# Patient Record
Sex: Female | Born: 1960
Health system: Southern US, Community
[De-identification: ages and names within clinical notes are randomized; demographics above are authoritative.]

## PROBLEM LIST (undated history)

## (undated) DIAGNOSIS — R002 Palpitations: Secondary | ICD-10-CM

## (undated) DIAGNOSIS — F419 Anxiety disorder, unspecified: Secondary | ICD-10-CM

## (undated) DIAGNOSIS — R011 Cardiac murmur, unspecified: Secondary | ICD-10-CM

## (undated) DIAGNOSIS — Z9889 Other specified postprocedural states: Secondary | ICD-10-CM

## (undated) HISTORY — DX: Palpitations: R00.2

## (undated) HISTORY — PX: OTHER SURGICAL HISTORY: SHX169

## (undated) HISTORY — DX: Cardiac murmur, unspecified: R01.1

---

## 2003-10-21 ENCOUNTER — Other Ambulatory Visit: Admission: RE | Admit: 2003-10-21 | Discharge: 2003-10-21 | Payer: Self-pay | Admitting: Obstetrics and Gynecology

## 2005-06-12 ENCOUNTER — Other Ambulatory Visit: Admission: RE | Admit: 2005-06-12 | Discharge: 2005-06-12 | Payer: Self-pay | Admitting: Obstetrics and Gynecology

## 2005-09-27 ENCOUNTER — Ambulatory Visit: Payer: Self-pay | Admitting: Obstetrics & Gynecology

## 2006-12-30 ENCOUNTER — Other Ambulatory Visit: Admission: RE | Admit: 2006-12-30 | Discharge: 2006-12-30 | Payer: Self-pay | Admitting: Obstetrics and Gynecology

## 2007-05-22 ENCOUNTER — Ambulatory Visit: Payer: Self-pay | Admitting: Obstetrics & Gynecology

## 2009-03-18 ENCOUNTER — Ambulatory Visit: Payer: Self-pay | Admitting: Gynecology

## 2009-03-18 ENCOUNTER — Other Ambulatory Visit: Admission: RE | Admit: 2009-03-18 | Discharge: 2009-03-18 | Payer: Self-pay | Admitting: Gynecology

## 2010-12-21 ENCOUNTER — Other Ambulatory Visit: Payer: Self-pay | Admitting: Gynecology

## 2010-12-21 NOTE — Telephone Encounter (Signed)
Lm on message on pt vm regarding Rx refill for Ambien, pt is overdue for her annual.

## 2011-04-15 LAB — HM PAP SMEAR: HM Pap smear: NORMAL

## 2011-04-15 LAB — HM MAMMOGRAPHY: HM Mammogram: NORMAL

## 2011-04-30 ENCOUNTER — Ambulatory Visit: Payer: Self-pay | Admitting: Specialist

## 2012-12-12 ENCOUNTER — Telehealth: Payer: Self-pay | Admitting: Internal Medicine

## 2012-12-12 NOTE — Telephone Encounter (Signed)
Left message for pt to call office  See note below  Shelia Media, MD Wyatt Portela            That would be fine.      Previous Messages      ----- Message -----  From: Wyatt Portela  Sent: 12/09/2012 3:30 PM  To: Shelia Media, MD   Ms [name] came in today wanting to know if you would see her daughter Jodi Anderson 03/09/1960   Phone# [phone]

## 2012-12-16 NOTE — Telephone Encounter (Signed)
Left message for pt to call office

## 2012-12-24 NOTE — Telephone Encounter (Signed)
Appointment 02/10/13 pt aware

## 2013-02-10 ENCOUNTER — Ambulatory Visit: Payer: Self-pay | Admitting: Internal Medicine

## 2013-02-17 ENCOUNTER — Ambulatory Visit: Payer: Self-pay | Admitting: Internal Medicine

## 2013-03-25 ENCOUNTER — Encounter: Payer: Self-pay | Admitting: *Deleted

## 2013-04-14 ENCOUNTER — Encounter (INDEPENDENT_AMBULATORY_CARE_PROVIDER_SITE_OTHER): Payer: Self-pay

## 2013-04-14 ENCOUNTER — Encounter: Payer: Self-pay | Admitting: Internal Medicine

## 2013-04-14 ENCOUNTER — Ambulatory Visit (INDEPENDENT_AMBULATORY_CARE_PROVIDER_SITE_OTHER): Payer: 59 | Admitting: Internal Medicine

## 2013-04-14 VITALS — BP 98/70 | HR 64 | Temp 98.1°F | Ht 61.5 in | Wt 122.0 lb

## 2013-04-14 DIAGNOSIS — G4700 Insomnia, unspecified: Secondary | ICD-10-CM

## 2013-04-14 DIAGNOSIS — Z1239 Encounter for other screening for malignant neoplasm of breast: Secondary | ICD-10-CM

## 2013-04-14 DIAGNOSIS — Z1211 Encounter for screening for malignant neoplasm of colon: Secondary | ICD-10-CM | POA: Insufficient documentation

## 2013-04-14 DIAGNOSIS — R002 Palpitations: Secondary | ICD-10-CM

## 2013-04-14 MED ORDER — ZOLPIDEM TARTRATE 10 MG PO TABS: 10.0000 mg | ORAL_TABLET | Freq: Every evening | ORAL | Status: DC | PRN

## 2013-04-14 NOTE — Assessment & Plan Note (Signed)
Continue Ambien prn  

## 2013-04-14 NOTE — Assessment & Plan Note (Signed)
Will request notes on previous evaluation. Exam is normal today. Follow up after records reviewed.

## 2013-04-14 NOTE — Progress Notes (Signed)
Pre visit review using our clinic review tool, if applicable. No additional management support is needed unless otherwise documented below in the visit note. 

## 2013-04-14 NOTE — Progress Notes (Signed)
Subjective:    Patient ID: Jodi Anderson, female    DOB: 1960/04/27, 53 y.o.   MRN: [id number]  HPI 53YO female presents to establish care. She is generally feeling well. Only concern today is occasional palpitations which occur when walking up stairs. She has been evaluated for this before with EKG and labs. Reports evaluation was normal. She exercises regularly by walking and lifting weights without symptoms. Denies chest pain or shortness of breath. Otherwise, doing well.  Symptoms of insomnia well controlled with Ambien, currently taking 1/3 of a 10mg  tablet daily. No issues with daytime somnolence.  Review of Systems  Constitutional: Negative for fever, chills, appetite change, fatigue and unexpected weight change.  HENT: Negative for congestion, ear pain, sinus pressure, sore throat, trouble swallowing and voice change.   Eyes: Negative for visual disturbance.  Respiratory: Negative for cough, shortness of breath, wheezing and stridor.   Cardiovascular: Positive for palpitations. Negative for chest pain and leg swelling.  Gastrointestinal: Negative for nausea, vomiting, abdominal pain, diarrhea, constipation, blood in stool, abdominal distention and anal bleeding.  Genitourinary: Negative for dysuria and flank pain.  Musculoskeletal: Negative for arthralgias, gait problem, myalgias and neck pain.  Skin: Negative for color change and rash.  Neurological: Negative for dizziness and headaches.  Hematological: Negative for adenopathy. Does not bruise/bleed easily.  Psychiatric/Behavioral: Negative for suicidal ideas, sleep disturbance and dysphoric mood. The patient is not nervous/anxious.        Objective:    BP 98/70  Pulse 64  Temp(Src) 98.1 F (36.7 C) (Oral)  Ht 5' 1.5" (1.562 m)  Wt 122 lb (55.339 kg)  BMI 22.68 kg/m2  SpO2 95%  LMP 03/31/2013 Physical Exam  Constitutional: She is oriented to person, place, and time. She appears well-developed and well-nourished. No  distress.  HENT:  Head: Normocephalic and atraumatic.  Right Ear: External ear normal.  Left Ear: External ear normal.  Nose: Nose normal.  Mouth/Throat: Oropharynx is clear and moist. No oropharyngeal exudate.  Eyes: Conjunctivae are normal. Pupils are equal, round, and reactive to light. Right eye exhibits no discharge. Left eye exhibits no discharge. No scleral icterus.  Neck: Normal range of motion. Neck supple. No tracheal deviation present. No thyromegaly present.  Cardiovascular: Normal rate, regular rhythm, normal heart sounds and intact distal pulses.  Exam reveals no gallop and no friction rub.   No murmur heard. Pulmonary/Chest: Effort normal and breath sounds normal. No accessory muscle usage. Not tachypneic. No respiratory distress. She has no decreased breath sounds. She has no wheezes. She has no rhonchi. She has no rales. She exhibits no tenderness.  Abdominal: Soft. Bowel sounds are normal. She exhibits no distension and no mass. There is no tenderness. There is no rebound and no guarding.  Musculoskeletal: Normal range of motion. She exhibits no edema and no tenderness.  Lymphadenopathy:    She has no cervical adenopathy.  Neurological: She is alert and oriented to person, place, and time. No cranial nerve deficit. She exhibits normal muscle tone. Coordination normal.  Skin: Skin is warm and dry. No rash noted. She is not diaphoretic. No erythema. No pallor.  Psychiatric: She has a normal mood and affect. Her behavior is normal. Judgment and thought content normal.          Assessment & Plan:   Problem List Items Addressed This Visit   Insomnia     Continue Ambien prn.    Relevant Medications      zolpidem (AMBIEN) tablet  Palpitations - Primary     Will request notes on previous evaluation. Exam is normal today. Follow up after records reviewed.    Screening for breast cancer     Mammogram ordered.    Relevant Orders      MM Digital Screening   Screening  for colon cancer     Colonoscopy referral placed.    Relevant Orders      Ambulatory referral to Gastroenterology       Return in about 3 months (around 07/15/2013) for Physical.

## 2013-04-14 NOTE — Assessment & Plan Note (Signed)
Mammogram ordered

## 2013-04-14 NOTE — Assessment & Plan Note (Signed)
Colonoscopy referral placed

## 2013-05-04 ENCOUNTER — Telehealth: Payer: Self-pay | Admitting: Internal Medicine

## 2013-05-04 ENCOUNTER — Telehealth: Payer: Self-pay | Admitting: *Deleted

## 2013-05-04 ENCOUNTER — Ambulatory Visit: Payer: Self-pay | Admitting: Internal Medicine

## 2013-05-04 ENCOUNTER — Ambulatory Visit (INDEPENDENT_AMBULATORY_CARE_PROVIDER_SITE_OTHER): Payer: 59 | Admitting: Internal Medicine

## 2013-05-04 ENCOUNTER — Encounter: Payer: Self-pay | Admitting: Internal Medicine

## 2013-05-04 VITALS — BP 108/72 | HR 64 | Temp 98.5°F | Wt 119.0 lb

## 2013-05-04 DIAGNOSIS — N920 Excessive and frequent menstruation with regular cycle: Secondary | ICD-10-CM

## 2013-05-04 DIAGNOSIS — N946 Dysmenorrhea, unspecified: Secondary | ICD-10-CM | POA: Insufficient documentation

## 2013-05-04 LAB — CBC WITH DIFFERENTIAL/PLATELET
Basophils Absolute: 0 10*3/uL (ref 0.0–0.1)
Basophils Relative: 0.8 % (ref 0.0–3.0)
Eosinophils Absolute: 0 10*3/uL (ref 0.0–0.7)
Eosinophils Relative: 0.3 % (ref 0.0–5.0)
HCT: 28 % — ABNORMAL LOW (ref 36.0–46.0)
Hemoglobin: 8.9 g/dL — ABNORMAL LOW (ref 12.0–15.0)
Lymphocytes Relative: 17.3 % (ref 12.0–46.0)
Lymphs Abs: 0.9 10*3/uL (ref 0.7–4.0)
MCHC: 31.8 g/dL (ref 30.0–36.0)
MCV: 72.7 fl — ABNORMAL LOW (ref 78.0–100.0)
Monocytes Absolute: 0.5 10*3/uL (ref 0.1–1.0)
Monocytes Relative: 9.6 % (ref 3.0–12.0)
Neutro Abs: 3.8 10*3/uL (ref 1.4–7.7)
Neutrophils Relative %: 72 % (ref 43.0–77.0)
Platelets: 343 10*3/uL (ref 150.0–400.0)
RBC: 3.86 Mil/uL — ABNORMAL LOW (ref 3.87–5.11)
RDW: 18 % — ABNORMAL HIGH (ref 11.5–14.6)
WBC: 5.3 10*3/uL (ref 4.5–10.5)

## 2013-05-04 LAB — FERRITIN: Ferritin: 3.2 ng/mL — ABNORMAL LOW (ref 10.0–291.0)

## 2013-05-04 NOTE — Telephone Encounter (Signed)
Ok,noted

## 2013-05-04 NOTE — Telephone Encounter (Signed)
Left message to return call 

## 2013-05-04 NOTE — Telephone Encounter (Signed)
You can put her in at 11am for visit.

## 2013-05-04 NOTE — Assessment & Plan Note (Signed)
Recent menorrhagia. Exam remarkable for heavy bleeding from cervical os. Will set up OB/GYN evaluation for US and exam. Question fibroid. Will check CBC, serum HCG and ferritin today.

## 2013-05-04 NOTE — Telephone Encounter (Signed)
Patient returned call and confirmed appointment for today

## 2013-05-04 NOTE — Telephone Encounter (Signed)
Pt called with concerns of passing very large clots vaginally over the weekend.  States she has had BTL but does have 1 period monthly.  States she has never experienced clots like she has passed this weekend and she is very concerned.  States it was very heavy Friday night, continued but lighter on Saturday and Sunday.  Asking if Dr. Dan HumphreysWalker could see her or send her for an ultrasound or refer her to someone who can help with this.  No appt available with Dr. Dan HumphreysWalker in office today.  Also transferred to triage  May leave msg on cell.

## 2013-05-04 NOTE — Telephone Encounter (Signed)
Called pt with results. She will follow up with OB tomorrow.

## 2013-05-04 NOTE — Telephone Encounter (Signed)
Pt called asking for results of US.  States she is aware that Dr. Dan HumphreysWalker is gone for the day, but is hoping someone can give her the results.  Call on cell.  Pt is very anxious.

## 2013-05-04 NOTE — Progress Notes (Signed)
   Subjective:    Patient ID: Jodi Anderson, female    DOB: 10/10/1960, 53 y.o.   MRN: [id number]  HPI 53YO female presents for acute visit.  Menorrhagia - Periods have typically been regular, lasting 5 days, light. Friday, felt pressure in vaginal area, had large blood clot. Friday night, had another large blood clot. Continued small clots on Saturday and Sunday. Checked pregnancy test which was negative. No cramping with clots.  Review of Systems  Constitutional: Positive for fatigue. Negative for fever, chills and diaphoresis.  Respiratory: Negative for shortness of breath.   Cardiovascular: Negative for chest pain.  Gastrointestinal: Negative for abdominal pain.  Genitourinary: Positive for vaginal bleeding and menstrual problem. Negative for hematuria, flank pain, vaginal discharge, genital sores, vaginal pain and pelvic pain.       Objective:    BP 108/72  Pulse 64  Temp(Src) 98.5 F (36.9 C) (Oral)  Wt 119 lb (53.978 kg)  SpO2 99%  LMP 03/31/2013 Physical Exam  Constitutional: She is oriented to person, place, and time. She appears well-developed and well-nourished. No distress.  HENT:  Head: Normocephalic and atraumatic.  Right Ear: External ear normal.  Left Ear: External ear normal.  Nose: Nose normal.  Mouth/Throat: Oropharynx is clear and moist.  Eyes: Conjunctivae are normal. Pupils are equal, round, and reactive to light. Right eye exhibits no discharge. Left eye exhibits no discharge. No scleral icterus.  Neck: Normal range of motion. Neck supple. No tracheal deviation present. No thyromegaly present.  Pulmonary/Chest: Effort normal. No accessory muscle usage. Not tachypneic. She has no decreased breath sounds. She has no rhonchi.  Genitourinary: Cervix exhibits no motion tenderness and no friability. Right adnexum displays no mass, no tenderness and no fullness. Left adnexum displays no mass, no tenderness and no fullness. There is bleeding around the vagina.    Profuse bleeding from cervical os with passage of moderate clots  Musculoskeletal: Normal range of motion. She exhibits no edema and no tenderness.  Lymphadenopathy:    She has no cervical adenopathy.  Neurological: She is alert and oriented to person, place, and time. No cranial nerve deficit. She exhibits normal muscle tone. Coordination normal.  Skin: Skin is warm and dry. No rash noted. She is not diaphoretic. No erythema. No pallor.  Psychiatric: She has a normal mood and affect. Her behavior is normal. Judgment and thought content normal.          Assessment & Plan:   Problem List Items Addressed This Visit   Dysmenorrhea   Relevant Orders      US Transvaginal Non-OB   Menorrhagia - Primary     Recent menorrhagia. Exam remarkable for heavy bleeding from cervical os. Will set up OB/GYN evaluation for US and exam. Question fibroid. Will check CBC, serum HCG and ferritin today.    Relevant Orders      Ambulatory referral to Obstetrics / Gynecology      CBC with Differential      Ferritin      hCG, serum, qualitative       Return in about 1 week (around 05/11/2013) for Follow up.

## 2013-05-04 NOTE — Telephone Encounter (Signed)
Read below and please advise 

## 2013-05-04 NOTE — Progress Notes (Signed)
Pre visit review using our clinic review tool, if applicable. No additional management support is needed unless otherwise documented below in the visit note. 

## 2013-05-04 NOTE — Telephone Encounter (Signed)
Megan from [facility] called with preliminary ultrasound results. Small fibroid noted 1.2 cm in size. Will send final note when completed

## 2013-05-05 LAB — HCG, SERUM, QUALITATIVE: Preg, Serum: NEGATIVE

## 2013-05-06 ENCOUNTER — Telehealth: Payer: Self-pay | Admitting: *Deleted

## 2013-05-06 NOTE — Telephone Encounter (Signed)
Pt left VM, asking about iron Rx, [facility] Per Dr. Tilman NeatWalker's result note: "She will start Ferrous sulfate 324mg  po tid"

## 2013-05-07 NOTE — Telephone Encounter (Signed)
Pt notified to begin ferrous sulfate OTC 324 mg po tid.

## 2013-07-15 ENCOUNTER — Encounter: Payer: 59 | Admitting: Internal Medicine

## 2013-07-15 DIAGNOSIS — Z0289 Encounter for other administrative examinations: Secondary | ICD-10-CM

## 2013-09-24 ENCOUNTER — Encounter: Payer: 59 | Admitting: Internal Medicine

## 2013-10-19 ENCOUNTER — Other Ambulatory Visit: Payer: Self-pay | Admitting: Internal Medicine

## 2013-10-19 NOTE — Telephone Encounter (Signed)
Last visit 05/04/13

## 2013-11-24 ENCOUNTER — Ambulatory Visit: Payer: Self-pay | Admitting: Internal Medicine

## 2013-11-24 LAB — HM MAMMOGRAPHY: HM Mammogram: NEGATIVE

## 2013-11-25 ENCOUNTER — Encounter: Payer: Self-pay | Admitting: *Deleted

## 2013-12-10 ENCOUNTER — Encounter (INDEPENDENT_AMBULATORY_CARE_PROVIDER_SITE_OTHER): Payer: Self-pay

## 2013-12-10 ENCOUNTER — Other Ambulatory Visit (HOSPITAL_COMMUNITY)
Admission: RE | Admit: 2013-12-10 | Discharge: 2013-12-10 | Disposition: A | Discharge status: Home / Self Care | Payer: 59 | Source: Ambulatory Visit | Attending: Internal Medicine | Admitting: Internal Medicine

## 2013-12-10 ENCOUNTER — Ambulatory Visit (INDEPENDENT_AMBULATORY_CARE_PROVIDER_SITE_OTHER): Payer: 59 | Admitting: Internal Medicine

## 2013-12-10 ENCOUNTER — Ambulatory Visit (INDEPENDENT_AMBULATORY_CARE_PROVIDER_SITE_OTHER): Payer: 59 | Admitting: *Deleted

## 2013-12-10 ENCOUNTER — Encounter: Payer: Self-pay | Admitting: Internal Medicine

## 2013-12-10 VITALS — BP 98/60 | HR 70 | Temp 98.5°F | Ht 61.75 in | Wt 123.5 lb

## 2013-12-10 DIAGNOSIS — Z01419 Encounter for gynecological examination (general) (routine) without abnormal findings: Secondary | ICD-10-CM | POA: Insufficient documentation

## 2013-12-10 DIAGNOSIS — D500 Iron deficiency anemia secondary to blood loss (chronic): Secondary | ICD-10-CM

## 2013-12-10 DIAGNOSIS — Z1151 Encounter for screening for human papillomavirus (HPV): Secondary | ICD-10-CM | POA: Insufficient documentation

## 2013-12-10 DIAGNOSIS — Z23 Encounter for immunization: Secondary | ICD-10-CM

## 2013-12-10 DIAGNOSIS — Z0000 Encounter for general adult medical examination without abnormal findings: Secondary | ICD-10-CM

## 2013-12-10 DIAGNOSIS — Z1211 Encounter for screening for malignant neoplasm of colon: Secondary | ICD-10-CM | POA: Insufficient documentation

## 2013-12-10 LAB — CBC WITH DIFFERENTIAL/PLATELET
Basophils Absolute: 0 10*3/uL (ref 0.0–0.1)
Basophils Relative: 1.1 % (ref 0.0–3.0)
Eosinophils Absolute: 0.1 10*3/uL (ref 0.0–0.7)
Eosinophils Relative: 1.3 % (ref 0.0–5.0)
HCT: 28.9 % — ABNORMAL LOW (ref 36.0–46.0)
Hemoglobin: 9.1 g/dL — ABNORMAL LOW (ref 12.0–15.0)
Lymphocytes Relative: 31.2 % (ref 12.0–46.0)
Lymphs Abs: 1.2 10*3/uL (ref 0.7–4.0)
MCHC: 31.5 g/dL (ref 30.0–36.0)
MCV: 67.2 fl — ABNORMAL LOW (ref 78.0–100.0)
Monocytes Absolute: 0.6 10*3/uL (ref 0.1–1.0)
Monocytes Relative: 14.6 % — ABNORMAL HIGH (ref 3.0–12.0)
Neutro Abs: 2 10*3/uL (ref 1.4–7.7)
Neutrophils Relative %: 51.8 % (ref 43.0–77.0)
Platelets: 282 10*3/uL (ref 150.0–400.0)
RBC: 4.3 Mil/uL (ref 3.87–5.11)
RDW: 19.3 % — ABNORMAL HIGH (ref 11.5–15.5)
WBC: 3.9 10*3/uL — ABNORMAL LOW (ref 4.0–10.5)

## 2013-12-10 LAB — COMPREHENSIVE METABOLIC PANEL
ALT: 14 U/L (ref 0–35)
AST: 21 U/L (ref 0–37)
Albumin: 4.1 g/dL (ref 3.5–5.2)
Alkaline Phosphatase: 36 U/L — ABNORMAL LOW (ref 39–117)
BUN: 14 mg/dL (ref 6–23)
CO2: 25 mEq/L (ref 19–32)
Calcium: 9.1 mg/dL (ref 8.4–10.5)
Chloride: 105 mEq/L (ref 96–112)
Creatinine, Ser: 0.7 mg/dL (ref 0.4–1.2)
GFR: 101.43 mL/min (ref >60.00)
Glucose, Bld: 88 mg/dL (ref 70–99)
Potassium: 4.3 mEq/L (ref 3.5–5.1)
Sodium: 136 mEq/L (ref 135–145)
Total Bilirubin: 0.5 mg/dL (ref 0.2–1.2)
Total Protein: 6.4 g/dL (ref 6.0–8.3)

## 2013-12-10 LAB — TSH: TSH: 0.69 u[IU]/mL (ref 0.35–4.50)

## 2013-12-10 LAB — LIPID PANEL
Cholesterol: 193 mg/dL (ref 0–200)
HDL: 98.8 mg/dL (ref >39.00)
LDL Cholesterol: 87 mg/dL (ref 0–99)
NonHDL: 94.2
Total CHOL/HDL Ratio: 2
Triglycerides: 34 mg/dL (ref 0.0–149.0)
VLDL: 6.8 mg/dL (ref 0.0–40.0)

## 2013-12-10 LAB — VITAMIN D 25 HYDROXY (VIT D DEFICIENCY, FRACTURES): VITD: 31.32 ng/mL (ref 30.00–100.00)

## 2013-12-10 NOTE — Patient Instructions (Signed)

## 2013-12-10 NOTE — Progress Notes (Signed)
Pre visit review using our clinic review tool, if applicable. No additional management support is needed unless otherwise documented below in the visit note. 

## 2013-12-10 NOTE — Addendum Note (Signed)
Addended by: Montine CircleMALDONADO, Qunicy Higinbotham D on: 12/10/2013 10:52 AM   Modules accepted: Orders

## 2013-12-10 NOTE — Progress Notes (Signed)
Subjective:    Patient ID: Jodi Anderson, female    DOB: 02/11/1960, 53 y.o.   MRN: [id number]  HPI 53YO female presents for annual exam.  Menorrhagia - last year, had endometrial biopsy which was normal. Now having normal periods.  Generally feeling well. No concerns today.  Review of Systems  Constitutional: Negative for fever, chills, appetite change, fatigue and unexpected weight change.  Eyes: Negative for visual disturbance.  Respiratory: Negative for shortness of breath.   Cardiovascular: Negative for chest pain and leg swelling.  Gastrointestinal: Negative for nausea, vomiting, abdominal pain, diarrhea and constipation.  Genitourinary: Negative for menstrual problem.  Musculoskeletal: Negative for myalgias and arthralgias.  Skin: Negative for color change and rash.  Hematological: Negative for adenopathy. Does not bruise/bleed easily.  Psychiatric/Behavioral: Negative for dysphoric mood. The patient is not nervous/anxious.        Objective:    BP 98/60 mmHg  Pulse 70  Temp(Src) 98.5 F (36.9 C) (Oral)  Ht 5' 1.75" (1.568 m)  Wt 123 lb 8 oz (56.019 kg)  BMI 22.78 kg/m2  SpO2 96%  LMP 11/23/2013 Physical Exam  Constitutional: She is oriented to person, place, and time. She appears well-developed and well-nourished. No distress.  HENT:  Head: Normocephalic and atraumatic.  Right Ear: External ear normal.  Left Ear: External ear normal.  Nose: Nose normal.  Mouth/Throat: Oropharynx is clear and moist. No oropharyngeal exudate.  Eyes: Conjunctivae are normal. Pupils are equal, round, and reactive to light. Right eye exhibits no discharge. Left eye exhibits no discharge. No scleral icterus.  Neck: Normal range of motion. Neck supple. No tracheal deviation present. No thyromegaly present.  Cardiovascular: Normal rate, regular rhythm, normal heart sounds and intact distal pulses.  Exam reveals no gallop and no friction rub.   No murmur heard. Pulmonary/Chest:  Effort normal and breath sounds normal. No respiratory distress. She has no wheezes. She has no rales. She exhibits no tenderness.  Abdominal: Soft. Bowel sounds are normal. She exhibits no distension and no mass. There is no tenderness. There is no rebound and no guarding.  Genitourinary: Rectum normal, vagina normal and uterus normal. No breast swelling, tenderness, discharge or bleeding. Pelvic exam was performed with patient supine. There is no rash, tenderness or lesion on the right labia. There is no rash, tenderness or lesion on the left labia. Uterus is not enlarged and not tender. Cervix exhibits no motion tenderness, no discharge and no friability. Right adnexum displays no mass, no tenderness and no fullness. Left adnexum displays no mass, no tenderness and no fullness. No erythema or tenderness in the vagina. No vaginal discharge found.  Musculoskeletal: Normal range of motion. She exhibits no edema or tenderness.  Lymphadenopathy:    She has no cervical adenopathy.  Neurological: She is alert and oriented to person, place, and time. No cranial nerve deficit. She exhibits normal muscle tone. Coordination normal.  Skin: Skin is warm and dry. No rash noted. She is not diaphoretic. No erythema. No pallor.  Psychiatric: She has a normal mood and affect. Her behavior is normal. Judgment and thought content normal.          Assessment & Plan:   Problem List Items Addressed This Visit      Unprioritized   Routine general medical examination at a health care facility - Primary    General medical exam normal today including breast and pelvic exam. PAP pending. Mammogram UTD and reviewed. Colonoscopy ordered. Labs today including CBC,CMP, lipids, TSH,  Vit D. Flu vaccine today.    Relevant Orders      Ambulatory referral to Gastroenterology      CBC with Differential      Comprehensive metabolic panel      Lipid panel      Vit D  25 hydroxy (rtn osteoporosis monitoring)      TSH    Special screening for malignant neoplasms, colon   Relevant Orders      Ambulatory referral to Gastroenterology       Return in about 1 year (around 12/11/2014) for Physical.

## 2013-12-10 NOTE — Assessment & Plan Note (Signed)
General medical exam normal today including breast and pelvic exam. PAP pending. Mammogram UTD and reviewed. Colonoscopy ordered. Labs today including CBC,CMP, lipids, TSH, Vit D. Flu vaccine today.

## 2013-12-11 NOTE — Addendum Note (Signed)
Addended by: Ronna PolioWALKER, JENNIFER A on: 12/11/2013 12:52 PM   Modules accepted: Orders, SmartSet

## 2013-12-15 LAB — CYTOLOGY - PAP

## 2013-12-16 ENCOUNTER — Encounter: Payer: Self-pay | Admitting: *Deleted

## 2013-12-24 ENCOUNTER — Encounter: Payer: Self-pay | Admitting: Internal Medicine

## 2013-12-28 ENCOUNTER — Ambulatory Visit: Payer: Self-pay | Admitting: Internal Medicine

## 2013-12-28 LAB — FOLATE: Folic Acid: 12.3 ng/mL (ref 3.1–17.5)

## 2013-12-28 LAB — FERRITIN: Ferritin (ARMC): 3 ng/mL — ABNORMAL LOW (ref 8–388)

## 2013-12-28 LAB — CBC CANCER CENTER
Bands: 2 %
Eosinophil: 4 %
HCT: 32.1 % — ABNORMAL LOW (ref 35.0–47.0)
HGB: 9.7 g/dL — ABNORMAL LOW (ref 12.0–16.0)
Lymphocytes: 46 %
MCH: 21.1 pg — ABNORMAL LOW (ref 26.0–34.0)
MCHC: 30.3 g/dL — ABNORMAL LOW (ref 32.0–36.0)
MCV: 70 fL — ABNORMAL LOW (ref 80–100)
Monocytes: 7 %
Platelet: 344 x10 3/mm (ref 150–440)
RBC: 4.61 10*6/uL (ref 3.80–5.20)
RDW: 19.5 % — ABNORMAL HIGH (ref 11.5–14.5)
Segmented Neutrophils: 41 %
WBC: 3.3 x10 3/mm — ABNORMAL LOW (ref 3.6–11.0)

## 2013-12-28 LAB — LACTATE DEHYDROGENASE: LDH: 183 U/L (ref 81–246)

## 2013-12-28 LAB — IRON AND TIBC
Iron Bind.Cap.(Total): 537 ug/dL — ABNORMAL HIGH (ref 250–450)
Iron Saturation: 6 %
Iron: 33 ug/dL — ABNORMAL LOW (ref 50–170)
Unbound Iron-Bind.Cap.: 504 ug/dL

## 2013-12-28 LAB — RETICULOCYTES
Absolute Retic Count: 0.048 10*6/uL (ref 0.019–0.186)
Reticulocyte: 1.1 % (ref 0.4–3.1)

## 2013-12-29 ENCOUNTER — Telehealth: Payer: Self-pay | Admitting: *Deleted

## 2013-12-29 LAB — PROT IMMUNOELECTROPHORES(ARMC)

## 2013-12-29 NOTE — Telephone Encounter (Signed)
Pt seen Dr Sherrlyn HockPandit today, her WBC was 3.3, they have ordered for her to have an ultrasound of her abdomen.  Pt is unsure of what is going on because she states "dr Sherrlyn Hockpandit isnt telling me anything"  Pt is requesting for you to call her to discuss

## 2013-12-29 NOTE — Telephone Encounter (Signed)
Can we request the notes from Dr. Sherrlyn HockPandit and the labs? Let her know we have requested the information and then I will call her when I have it.

## 2014-01-01 NOTE — Telephone Encounter (Signed)
Sent for records

## 2014-01-04 NOTE — Telephone Encounter (Signed)
Attempted to reach patient by phone. No answer. Reviewed notes from Dr. Sherrlyn HockPandit. They think her anemia is from iron deficiency from the menstrual blood loss before. So, just planning to supplement iron and monitor. They are not certain why her white cells are low. This is why they are checking an ultrasound of the spleen and liver. To be sure no process like cirrhosis causing low WBC. I think this would be very unlikely for her given her history, but best to be thorough.

## 2014-01-04 NOTE — Telephone Encounter (Signed)
Records received, Put in folder for your review

## 2014-01-08 NOTE — Telephone Encounter (Signed)
Notified pt, Pt has an appt in Jan with Dr Sherrlyn HockPandit, she will have them send over notes from that visit

## 2014-01-22 ENCOUNTER — Ambulatory Visit: Payer: Self-pay | Admitting: Internal Medicine

## 2014-01-22 HISTORY — PX: COLONOSCOPY: SHX174

## 2014-02-01 LAB — CBC CANCER CENTER
Basophil #: 0.1 x10 3/mm (ref 0.0–0.1)
Basophil %: 1.2 %
Eosinophil #: 0.1 x10 3/mm (ref 0.0–0.7)
Eosinophil %: 1.8 %
HCT: 40.9 % (ref 35.0–47.0)
HGB: 13.1 g/dL (ref 12.0–16.0)
Lymphocyte #: 1.5 x10 3/mm (ref 1.0–3.6)
Lymphocyte %: 26.5 %
MCH: 25.4 pg — ABNORMAL LOW (ref 26.0–34.0)
MCHC: 32 g/dL (ref 32.0–36.0)
MCV: 79 fL — ABNORMAL LOW (ref 80–100)
Monocyte #: 0.7 x10 3/mm (ref 0.2–0.9)
Monocyte %: 12.8 %
Neutrophil #: 3.2 x10 3/mm (ref 1.4–6.5)
Neutrophil %: 57.7 %
Platelet: 231 x10 3/mm (ref 150–440)
RBC: 5.15 10*6/uL (ref 3.80–5.20)
RDW: 27.4 % — ABNORMAL HIGH (ref 11.5–14.5)
WBC: 5.5 x10 3/mm (ref 3.6–11.0)

## 2014-02-01 LAB — FERRITIN: Ferritin (ARMC): 19 ng/mL (ref 8–388)

## 2014-02-01 LAB — IRON AND TIBC
Iron Bind.Cap.(Total): 379 ug/dL (ref 250–450)
Iron Saturation: 17 %
Iron: 63 ug/dL (ref 50–170)
Unbound Iron-Bind.Cap.: 316 ug/dL

## 2014-02-22 ENCOUNTER — Ambulatory Visit: Payer: Self-pay | Admitting: Internal Medicine

## 2014-05-11 ENCOUNTER — Other Ambulatory Visit: Payer: Self-pay | Admitting: Internal Medicine

## 2014-05-11 NOTE — Telephone Encounter (Signed)
Last OV 11.19.15.  Please advise refill 

## 2014-09-21 ENCOUNTER — Encounter: Payer: Self-pay | Admitting: Internal Medicine

## 2014-09-21 ENCOUNTER — Ambulatory Visit (INDEPENDENT_AMBULATORY_CARE_PROVIDER_SITE_OTHER): Payer: Commercial Managed Care - HMO | Admitting: Internal Medicine

## 2014-09-21 ENCOUNTER — Encounter (INDEPENDENT_AMBULATORY_CARE_PROVIDER_SITE_OTHER): Payer: Self-pay

## 2014-09-21 ENCOUNTER — Other Ambulatory Visit: Payer: Self-pay | Admitting: Internal Medicine

## 2014-09-21 VITALS — BP 98/64 | HR 81 | Temp 98.4°F | Wt 118.2 lb

## 2014-09-21 DIAGNOSIS — R5383 Other fatigue: Secondary | ICD-10-CM | POA: Diagnosis not present

## 2014-09-21 DIAGNOSIS — R002 Palpitations: Secondary | ICD-10-CM | POA: Diagnosis not present

## 2014-09-21 LAB — CBC WITH DIFFERENTIAL/PLATELET
Basophils Absolute: 0 10*3/uL (ref 0.0–0.1)
Basophils Relative: 0.8 % (ref 0.0–3.0)
Eosinophils Absolute: 0.1 10*3/uL (ref 0.0–0.7)
Eosinophils Relative: 2.3 % (ref 0.0–5.0)
HCT: 42.7 % (ref 36.0–46.0)
Hemoglobin: 14.2 g/dL (ref 12.0–15.0)
Lymphocytes Relative: 33.2 % (ref 12.0–46.0)
Lymphs Abs: 1.8 10*3/uL (ref 0.7–4.0)
MCHC: 33.3 g/dL (ref 30.0–36.0)
MCV: 92.2 fl (ref 78.0–100.0)
Monocytes Absolute: 0.6 10*3/uL (ref 0.1–1.0)
Monocytes Relative: 11.5 % (ref 3.0–12.0)
Neutro Abs: 2.8 10*3/uL (ref 1.4–7.7)
Neutrophils Relative %: 52.2 % (ref 43.0–77.0)
Platelets: 241 10*3/uL (ref 150.0–400.0)
RBC: 4.64 Mil/uL (ref 3.87–5.11)
RDW: 13 % (ref 11.5–15.5)
WBC: 5.4 10*3/uL (ref 4.0–10.5)

## 2014-09-21 LAB — HM COLONOSCOPY: HM Colonoscopy: NORMAL

## 2014-09-21 NOTE — Progress Notes (Signed)
Subjective:    Patient ID: Jodi Anderson, female    DOB: January 05, 1961, 54 y.o.   MRN: [id number]  HPI  54YO female presents with fatigue.  Followed by oncology for iron deficiency anemia.  Fatigue - Feeling generally tired over last 1-2 months. Occasionally feels like heart is rushing when she is rushing, however exercising without chest pain or palpitations. Sometimes feels exhausted with hot flash. Also notes some rash over legs, elbows and forehead. Described as small bumps. No worsening in sun. Started back on ferrous sulfate 2 weeks ago. No bloody stools. No black stools.  No past medical history on file. Family History  Problem Relation Age of Onset  . Epilepsy Son   . Cancer Paternal Grandmother     breast  . Cancer Maternal Aunt     lung   Past Surgical History  Procedure Laterality Date  . Tummy tuck    . Cesarean section      3x  . Vaginal delivery     Social History   Social History  . Marital Status: Married    Spouse Name: N/A  . Number of Children: N/A  . Years of Education: N/A   Social History Main Topics  . Smoking status: Never Smoker   . Smokeless tobacco: Never Used  . Alcohol Use: Yes  . Drug Use: No  . Sexual Activity: Not Asked   Other Topics Concern  . None   Social History Narrative   Lives in [place]. 4 children, 1 son at home has epilepsy, son in 10th grade. Has dog and 3 cats.      Work - homemaker      Diet - regular diet      Exercise - walks daily, weights, trampoline    Review of Systems  Constitutional: Positive for diaphoresis (with hot flashes) and fatigue. Negative for fever, chills, appetite change and unexpected weight change.  Eyes: Negative for visual disturbance.  Respiratory: Positive for chest tightness. Negative for cough and shortness of breath.   Cardiovascular: Positive for palpitations. Negative for chest pain and leg swelling.  Gastrointestinal: Negative for nausea, vomiting, abdominal pain, diarrhea,  constipation and blood in stool.  Skin: Negative for color change and rash.  Neurological: Positive for weakness. Negative for dizziness, light-headedness, numbness and headaches.  Hematological: Negative for adenopathy. Does not bruise/bleed easily.  Psychiatric/Behavioral: Negative for sleep disturbance and dysphoric mood. The patient is not nervous/anxious.        Objective:    BP 98/64 mmHg  Pulse 81  Temp(Src) 98.4 F (36.9 C) (Oral)  Wt 118 lb 4 oz (53.638 kg)  SpO2 98%  LMP 06/23/2014 (Approximate) Physical Exam  Constitutional: She is oriented to person, place, and time. She appears well-developed and well-nourished. No distress.  HENT:  Head: Normocephalic and atraumatic.  Right Ear: External ear normal.  Left Ear: External ear normal.  Nose: Nose normal.  Mouth/Throat: Oropharynx is clear and moist. No oropharyngeal exudate.  Eyes: Conjunctivae are normal. Pupils are equal, round, and reactive to light. Right eye exhibits no discharge. Left eye exhibits no discharge. No scleral icterus.  Neck: Normal range of motion. Neck supple. No tracheal deviation present. No thyromegaly present.  Cardiovascular: Normal rate, regular rhythm, normal heart sounds and intact distal pulses.  Exam reveals no gallop and no friction rub.   No murmur heard. Pulmonary/Chest: Effort normal and breath sounds normal. No respiratory distress. She has no wheezes. She has no rales. She exhibits no tenderness.  Musculoskeletal: Normal range of motion. She exhibits no edema or tenderness.  Lymphadenopathy:    She has no cervical adenopathy.  Neurological: She is alert and oriented to person, place, and time. No cranial nerve deficit. She exhibits normal muscle tone. Coordination normal.  Skin: Skin is warm and dry. No rash noted. She is not diaphoretic. No erythema. No pallor.  Psychiatric: She has a normal mood and affect. Her behavior is normal. Judgment and thought content normal.            Assessment & Plan:   Problem List Items Addressed This Visit      Unprioritized   Other fatigue - Primary    Recent worsening fatigue. Previous h/o anemia. Will repeat CBC, ferritin, B12, TSH, T4, CMP. Follow up 1 week.      Relevant Orders   T4, free   TSH   CBC with Differential/Platelet   Comprehensive metabolic panel   W11   Ferritin   Antinuclear Antib (ANA)   Palpitations    Recent episodes of palpitations associated with rushing to events. Exam normal. EKG today normal. Will check thyroid function, CBC, CMP.      Relevant Orders   EKG 12-Lead (Completed)       Return in about 1 week (around 09/28/2014) for Recheck.

## 2014-09-21 NOTE — Assessment & Plan Note (Addendum)
Recent episodes of palpitations associated with rushing to events. Exam normal. EKG today normal. Will check thyroid function, CBC, CMP.

## 2014-09-21 NOTE — Progress Notes (Signed)
Pre visit review using our clinic review tool, if applicable. No additional management support is needed unless otherwise documented below in the visit note. 

## 2014-09-21 NOTE — Assessment & Plan Note (Signed)
Recent worsening fatigue. Previous h/o anemia. Will repeat CBC, ferritin, B12, TSH, T4, CMP. Follow up 1 week.

## 2014-09-21 NOTE — Patient Instructions (Signed)
Labs today. Follow up in 1 week.

## 2014-09-22 ENCOUNTER — Telehealth: Payer: Self-pay | Admitting: *Deleted

## 2014-09-22 NOTE — Telephone Encounter (Signed)
Patient has called for a second time requesting her lab results, patient has stated that she would like to know of any results that have been resulted.-Thanks

## 2014-09-22 NOTE — Telephone Encounter (Signed)
Can you look into the rest of her labs? Only CBC was back

## 2014-09-22 NOTE — Telephone Encounter (Signed)
Pt called requesting lab results. Please advise.  

## 2014-09-22 NOTE — Telephone Encounter (Signed)
Spoke with pt, advised of CBC results.

## 2014-09-23 ENCOUNTER — Telehealth: Payer: Self-pay | Admitting: Internal Medicine

## 2014-09-23 LAB — COMPREHENSIVE METABOLIC PANEL
ALT: 17 U/L (ref 6–29)
AST: 21 U/L (ref 10–35)
Albumin: 4.4 g/dL (ref 3.6–5.1)
Alkaline Phosphatase: 42 U/L (ref 33–130)
BUN: 16 mg/dL (ref 7–25)
CO2: 29 mmol/L (ref 20–31)
Calcium: 9.7 mg/dL (ref 8.6–10.4)
Chloride: 99 mmol/L (ref 98–110)
Creat: 0.62 mg/dL (ref 0.50–1.05)
Glucose, Bld: 82 mg/dL (ref 65–99)
Potassium: 4.7 mmol/L (ref 3.5–5.3)
Sodium: 138 mmol/L (ref 135–146)
Total Bilirubin: 0.7 mg/dL (ref 0.2–1.2)
Total Protein: 6.5 g/dL (ref 6.1–8.1)

## 2014-09-23 LAB — VITAMIN B12: Vitamin B-12: 324 pg/mL (ref 211–911)

## 2014-09-23 LAB — TSH: TSH: 0.737 u[IU]/mL (ref 0.350–4.500)

## 2014-09-23 LAB — FERRITIN: Ferritin: 55 ng/mL (ref 10–291)

## 2014-09-23 LAB — ANA: Anti Nuclear Antibody(ANA): NEGATIVE

## 2014-09-23 LAB — T4, FREE: Free T4: 1.16 ng/dL (ref 0.80–1.80)

## 2014-09-23 MED ORDER — TRIAMCINOLONE ACETONIDE 0.5 % EX OINT: 1.0000 "application " | TOPICAL_OINTMENT | Freq: Two times a day (BID) | CUTANEOUS | Status: DC

## 2014-09-23 NOTE — Telephone Encounter (Signed)
Steroid cream sent to pharmacy 

## 2014-11-22 ENCOUNTER — Other Ambulatory Visit: Payer: Self-pay | Admitting: Internal Medicine

## 2014-11-23 NOTE — Telephone Encounter (Signed)
Please advise 

## 2014-11-24 ENCOUNTER — Other Ambulatory Visit: Payer: Self-pay | Admitting: Internal Medicine

## 2014-12-08 ENCOUNTER — Ambulatory Visit (INDEPENDENT_AMBULATORY_CARE_PROVIDER_SITE_OTHER): Payer: Commercial Managed Care - HMO | Admitting: Family Medicine

## 2014-12-08 ENCOUNTER — Encounter: Payer: Self-pay | Admitting: Family Medicine

## 2014-12-08 VITALS — BP 100/62 | HR 60 | Temp 98.5°F | Ht 61.75 in | Wt 123.4 lb

## 2014-12-08 DIAGNOSIS — R21 Rash and other nonspecific skin eruption: Secondary | ICD-10-CM | POA: Diagnosis not present

## 2014-12-08 MED ORDER — LEVOCETIRIZINE DIHYDROCHLORIDE 5 MG PO TABS: 5.0000 mg | ORAL_TABLET | Freq: Every evening | ORAL | Status: DC

## 2014-12-08 MED ORDER — MONTELUKAST SODIUM 10 MG PO TABS: 10.0000 mg | ORAL_TABLET | Freq: Every day | ORAL | Status: DC

## 2014-12-08 NOTE — Progress Notes (Signed)
Pre visit review using our clinic review tool, if applicable. No additional management support is needed unless otherwise documented below in the visit note. 

## 2014-12-08 NOTE — Progress Notes (Signed)
Subjective:  Patient ID: Jodi Anderson, female    DOB: December 18, 1960  Age: 54 y.o. MRN: [id number]  CC: Hives  HPI:  54 year old female presents to clinic today with complaints of rash which she feels like his hives.  Patient reports that this is been troublesome since the beginning of October. She has had intermittent rash in various spots including the elbows, legs, face. Most recently she's been expressing persistent rash and associated itching of the elbows. She has seen dermatology twice for this and was told that she had hives. She was prescribed prednisone and antihistamines as well as topical tacrolimus. She states that she has had some brief improvement but it has returned and has continued to be bothersome. She states that the rash worsens with scratching. No known inciting factors. No new exposures. She states that she is currently taking an antihistamine at night, Allegra during the day, and using topical agents. No other associated symptoms.  Social Hx   Social History   Social History  . Marital Status: Married    Spouse Name: N/A  . Number of Children: N/A  . Years of Education: N/A   Social History Main Topics  . Smoking status: Never Smoker   . Smokeless tobacco: Never Used  . Alcohol Use: Yes  . Drug Use: No  . Sexual Activity: Not Asked   Other Topics Concern  . None   Social History Narrative   Lives in [place]. 4 children, 1 son at home has epilepsy, son in 10th grade. Has dog and 3 cats.      Work - homemaker      Diet - regular diet      Exercise - walks daily, weights, trampoline   Review of Systems  Constitutional: Negative.   Skin: Positive for rash.   Objective:  BP 100/62 mmHg  Pulse 60  Temp(Src) 98.5 F (36.9 C) (Oral)  Ht 5' 1.75" (1.568 m)  Wt 123 lb 6 oz (55.963 kg)  BMI 22.76 kg/m2  SpO2 97%  BP/Weight 12/08/2014 09/21/2014 12/10/2013  Systolic BP 100 98 98  Diastolic BP 62 64 60  Wt. (Lbs) 123.38 118.25 123.5  BMI 22.76  21.82 22.78   Physical Exam  Constitutional: She is oriented to person, place, and time. She appears well-developed. No distress.  Eyes: Conjunctivae are normal.  Pulmonary/Chest: Effort normal. No respiratory distress.  Neurological: She is alert and oriented to person, place, and time.  Skin:  Erythematous papules noted on the elbows.   Psychiatric: She has a normal mood and affect.  Vitals reviewed.  Lab Results  Component Value Date   WBC 5.4 09/21/2014   HGB 14.2 09/21/2014   HCT 42.7 09/21/2014   PLT 241.0 09/21/2014   GLUCOSE 82 09/21/2014   CHOL 193 12/10/2013   TRIG 34.0 12/10/2013   HDL 98.80 12/10/2013   LDLCALC 87 12/10/2013   ALT 17 09/21/2014   AST 21 09/21/2014   NA 138 09/21/2014   K 4.7 09/21/2014   CL 99 09/21/2014   CREATININE 0.62 09/21/2014   BUN 16 09/21/2014   CO2 29 09/21/2014   TSH 0.737 09/21/2014    Assessment & Plan:   Problem List Items Addressed This Visit    Rash - Primary    Urticaria versus contact/allergic in nature. Advised patient to discontinue Allegra during the day and start Xyzal. Adding Singulair as well. Patient to continue Atarax at night. Patient also continue topical agents from dermatology. If fails to improve I advised  follow-up with dermatology for possible biopsy.         Meds ordered this encounter  Medications  . levocetirizine (XYZAL) 5 MG tablet    Sig: Take 1 tablet (5 mg total) by mouth every evening.    Dispense:  90 tablet    Refill:  0  . montelukast (SINGULAIR) 10 MG tablet    Sig: Take 1 tablet (10 mg total) by mouth at bedtime.    Dispense:  90 tablet    Refill:  0    Follow-up: PRN  Everlene OtherJayce Deston Bilyeu, DO

## 2014-12-08 NOTE — Assessment & Plan Note (Signed)
Urticaria versus contact/allergic in nature. Advised patient to discontinue Allegra during the day and start Xyzal. Adding Singulair as well. Patient to continue Atarax at night. Patient also continue topical agents from dermatology. If fails to improve I advised follow-up with dermatology for possible biopsy.

## 2014-12-08 NOTE — Patient Instructions (Signed)
Take the Levocetirizine daily.  Use the Hydroxyzine at night.  Take the singulair daily as well.  Continue the topical agents regularly.  Call Derm if you fail to improve.  Take care  Dr. Adriana Simasook

## 2014-12-13 ENCOUNTER — Encounter: Payer: Self-pay | Admitting: Family Medicine

## 2014-12-13 ENCOUNTER — Ambulatory Visit (INDEPENDENT_AMBULATORY_CARE_PROVIDER_SITE_OTHER): Payer: Commercial Managed Care - HMO | Admitting: Family Medicine

## 2014-12-13 VITALS — BP 106/70 | HR 72 | Temp 98.4°F | Ht 61.75 in | Wt 124.5 lb

## 2014-12-13 DIAGNOSIS — B029 Zoster without complications: Secondary | ICD-10-CM

## 2014-12-13 MED ORDER — VALACYCLOVIR HCL 1 G PO TABS: 1000.0000 mg | ORAL_TABLET | Freq: Three times a day (TID) | ORAL | Status: DC

## 2014-12-13 NOTE — Patient Instructions (Signed)
Take the valtrex 3 times daily as prescribed.  Call is if you fail to improve or worsen.  Take care  Dr. Adriana Simasook

## 2014-12-13 NOTE — Assessment & Plan Note (Signed)
Exam consistent with zoster. Treating with Valtrex.

## 2014-12-13 NOTE — Progress Notes (Signed)
   Subjective:  Patient ID: Jodi Anderson, female    DOB: 11/07/1960  Age: 54 y.o. MRN: [id number]  CC: Rash, Right hand  HPI:  54 year old female presents today with complaints of a painful rash on her right hand.  Patient reports that it began on Friday and then rash became evident on Saturday. She describes him as blisters. She reports associated pain. No exacerbating or relieving factors. She states that she talked with nurse about this who thought that this was consistent with shingles. He does note recent stressors. No other complaints this time.  Social Hx   Social History   Social History  . Marital Status: Married    Spouse Name: N/A  . Number of Children: N/A  . Years of Education: N/A   Social History Main Topics  . Smoking status: Never Smoker   . Smokeless tobacco: Never Used  . Alcohol Use: Yes  . Drug Use: No  . Sexual Activity: Not Asked   Other Topics Concern  . None   Social History Narrative   [place] in [place]. 4 children, 1 son at home has epilepsy, son in 10th grade. Has dog and 3 cats.      Work - homemaker      Diet - regular diet      Exercise - walks daily, weights, trampoline   Review of Systems  Constitutional: Negative.   Skin: Positive for rash.   Objective:  BP 106/70 mmHg  Pulse 72  Temp(Src) 98.4 F (36.9 C) (Oral)  Ht 5' 1.75" (1.568 m)  Wt 124 lb 8 oz (56.473 kg)  BMI 22.97 kg/m2  SpO2 97%  BP/Weight 12/13/2014 12/08/2014 09/21/2014  Systolic BP 106 100 98  Diastolic BP 70 62 64  Wt. (Lbs) 124.5 123.38 118.25  BMI 22.97 22.76 21.82   Physical Exam  Constitutional: She appears well-developed. No distress.  Pulmonary/Chest: Effort normal.  Neurological: She is alert.  Skin:  Right hand - hypothenar eminence with vesicles/blisters (C8 dermatome).   Psychiatric: She has a normal mood and affect.    Lab Results  Component Value Date   WBC 5.4 09/21/2014   HGB 14.2 09/21/2014   HCT 42.7 09/21/2014   PLT 241.0  09/21/2014   GLUCOSE 82 09/21/2014   CHOL 193 12/10/2013   TRIG 34.0 12/10/2013   HDL 98.80 12/10/2013   LDLCALC 87 12/10/2013   ALT 17 09/21/2014   AST 21 09/21/2014   NA 138 09/21/2014   K 4.7 09/21/2014   CL 99 09/21/2014   CREATININE 0.62 09/21/2014   BUN 16 09/21/2014   CO2 29 09/21/2014   TSH 0.737 09/21/2014    Assessment & Plan:   Problem List Items Addressed This Visit    Herpes zoster - Primary    Exam consistent with zoster. Treating with Valtrex.      Relevant Medications   valACYclovir (VALTREX) 1000 MG tablet      Meds ordered this encounter  Medications  . HydrOXYzine HCl 10 MG/5ML SOLN    Sig: Take 10 mg by mouth once.  . valACYclovir (VALTREX) 1000 MG tablet    Sig: Take 1 tablet (1,000 mg total) by mouth 3 (three) times daily.    Dispense:  21 tablet    Refill:  0    Follow-up: PRN  Everlene OtherJayce Darien Kading, DO

## 2014-12-13 NOTE — Progress Notes (Signed)
Pre visit review using our clinic review tool, if applicable. No additional management support is needed unless otherwise documented below in the visit note. 

## 2015-03-11 ENCOUNTER — Other Ambulatory Visit: Payer: Self-pay | Admitting: Internal Medicine

## 2015-03-11 DIAGNOSIS — Z760 Encounter for issue of repeat prescription: Secondary | ICD-10-CM

## 2015-04-18 ENCOUNTER — Other Ambulatory Visit: Payer: Self-pay | Admitting: Internal Medicine

## 2015-04-19 NOTE — Telephone Encounter (Signed)
This medication was listed as a historical medication for us, someone in our office refilled it last month, which was the first time the pt received the mediation from you... Unsure why pt takes this, no dx listed in problem list... Okay to refill?

## 2015-04-26 ENCOUNTER — Other Ambulatory Visit: Payer: Self-pay | Admitting: Internal Medicine

## 2015-04-26 DIAGNOSIS — Z760 Encounter for issue of repeat prescription: Secondary | ICD-10-CM

## 2015-04-26 NOTE — Telephone Encounter (Signed)
Patient left message to have a refill on her Arthotec . She is needing [facility]

## 2015-04-26 NOTE — Telephone Encounter (Signed)
Please advise for refill on this medication, LAst OV with you was in August of 2016.  Thanks

## 2015-04-27 ENCOUNTER — Telehealth: Payer: Self-pay | Admitting: *Deleted

## 2015-04-27 MED ORDER — DICLOFENAC-MISOPROSTOL 50-0.2 MG PO TBEC: 1.0000 | DELAYED_RELEASE_TABLET | ORAL | Status: DC

## 2015-04-27 NOTE — Telephone Encounter (Signed)
I found it was refill  On 03/11/15 under you but I don't see who actually prescribed it initially.  Please advise?

## 2015-04-27 NOTE — Telephone Encounter (Signed)
Who initially prescribed this? Not me

## 2015-04-27 NOTE — Telephone Encounter (Signed)
Patient would like to have her anti-inflammatory refilled, arthrotec. [facility]

## 2015-04-27 NOTE — Telephone Encounter (Signed)
This was refilled this am already. thanks

## 2015-07-15 ENCOUNTER — Telehealth: Payer: Self-pay | Admitting: *Deleted

## 2015-07-15 ENCOUNTER — Other Ambulatory Visit: Payer: Self-pay | Admitting: Internal Medicine

## 2015-07-15 NOTE — Telephone Encounter (Signed)
Refill request for Ambien, last seen [id number], last filled [id number].  Please advise.

## 2015-07-15 NOTE — Telephone Encounter (Signed)
See refill encounter

## 2015-07-15 NOTE — Telephone Encounter (Signed)
[facility] a [facility]  Pharmacy [facility]

## 2015-07-18 ENCOUNTER — Telehealth: Payer: Self-pay | Admitting: *Deleted

## 2015-07-18 NOTE — Telephone Encounter (Signed)
Needs visit

## 2015-07-18 NOTE — Telephone Encounter (Signed)
Patient will wait to be seen at already scheduled appointment.

## 2015-07-18 NOTE — Telephone Encounter (Signed)
Requests skelaxin for her plantar fascitis. Not on her current medication list.  She spoke with her podiatrist Dr. Ether GriffinsFowler and he told her PCP would have to prescribe.  She has an upcoming appointment 07/29/15 but would like prior to this date if possible.

## 2015-07-18 NOTE — Telephone Encounter (Signed)
Patient stated that she would need a medication refill for skeltin for her heel foot issues.  [facility]  Pt contact [phone]

## 2015-07-29 ENCOUNTER — Encounter: Payer: Commercial Managed Care - HMO | Admitting: Internal Medicine

## 2015-08-01 ENCOUNTER — Ambulatory Visit (INDEPENDENT_AMBULATORY_CARE_PROVIDER_SITE_OTHER): Payer: Commercial Managed Care - HMO | Admitting: Sports Medicine

## 2015-08-01 ENCOUNTER — Encounter: Payer: Self-pay | Admitting: Sports Medicine

## 2015-08-01 VITALS — BP 100/60 | Ht 62.0 in | Wt 120.0 lb

## 2015-08-01 DIAGNOSIS — M25572 Pain in left ankle and joints of left foot: Secondary | ICD-10-CM | POA: Diagnosis not present

## 2015-08-01 NOTE — Progress Notes (Signed)
   Subjective:    Patient ID: Jodi Anderson, female    DOB: 02/28/1960, 55 y.o.   MRN: [id number]  HPI chief complaint: Left foot pain  55 year old female comes in today complaining of lateral left foot pain which has been present since April. She has a history of plantar fasciitis. She has been treated several times over the years for plantar fasciitis. Most of the time her symptoms resolve either with a cortisone injection or with oral anti-inflammatories. Her most recent episode of plantar fasciitis occurred in November. She received a shot in her heel 2 months ago and her heel pain has resolved but she has persistent lateral foot pain which radiates up into the lateral aspect of her lower leg as well. Her pain seems to be worse with first standing and first walking. She does however get pain during activity as well. She describes it as an aching discomfort. No numbness or tingling.  Past medical history reviewed Medications reviewed  Allergies reviewed    Review of Systems    As above  Objective:   Physical Exam  Well-developed, well-nourished. No acute distress.  Left foot: Patient has a collapse of her transverse arch but has a fairly well-preserved longitudinal arch. There is no tenderness to palpation at the calcaneal origin of the plantar fascia. No pain with calcaneal squeeze. No tenderness to palpation along the lateral foot or the peroneal tendon. She is tender to palpation along the peroneal muscle bellies along the lateral lower leg. Her pain is worse with pushoff when walking. She is neurovascularly intact distally. Walking without a noticeable limp.      Assessment & Plan:  Resolved left foot plantar fasciitis now with lateral column overload/peroneal muscle strain  The only shoes the patient brought with her today are a pair of very high heels. She was given some sort of insert by an orthopedist many years ago so I will give her a green sports insole to try in her  tennis shoes. I have also given her a modified Alfredson heel drop program (toes pointed out to isolate the peroneal tendon) and she will follow-up with me in 4 weeks. She will bring her tennis shoes to that follow-up visit. Call with questions or concerns in the interim. She may continue with activity as tolerated.

## 2015-08-22 ENCOUNTER — Encounter: Payer: Self-pay | Admitting: Internal Medicine

## 2015-08-22 ENCOUNTER — Encounter (INDEPENDENT_AMBULATORY_CARE_PROVIDER_SITE_OTHER): Payer: Self-pay

## 2015-08-22 ENCOUNTER — Ambulatory Visit (INDEPENDENT_AMBULATORY_CARE_PROVIDER_SITE_OTHER): Payer: Commercial Managed Care - HMO | Admitting: Internal Medicine

## 2015-08-22 VITALS — BP 102/64 | HR 62 | Ht 61.75 in | Wt 123.4 lb

## 2015-08-22 DIAGNOSIS — Z0000 Encounter for general adult medical examination without abnormal findings: Secondary | ICD-10-CM | POA: Diagnosis not present

## 2015-08-22 LAB — COMPREHENSIVE METABOLIC PANEL
ALT: 21 U/L (ref 0–35)
AST: 21 U/L (ref 0–37)
Albumin: 4.3 g/dL (ref 3.5–5.2)
Alkaline Phosphatase: 49 U/L (ref 39–117)
BUN: 19 mg/dL (ref 6–23)
CO2: 31 mEq/L (ref 19–32)
Calcium: 9.8 mg/dL (ref 8.4–10.5)
Chloride: 104 mEq/L (ref 96–112)
Creatinine, Ser: 0.74 mg/dL (ref 0.40–1.20)
GFR: 86.77 mL/min (ref >60.00)
Glucose, Bld: 95 mg/dL (ref 70–99)
Potassium: 4.5 mEq/L (ref 3.5–5.1)
Sodium: 142 mEq/L (ref 135–145)
Total Bilirubin: 0.6 mg/dL (ref 0.2–1.2)
Total Protein: 6.7 g/dL (ref 6.0–8.3)

## 2015-08-22 LAB — CBC WITH DIFFERENTIAL/PLATELET
Basophils Absolute: 0 10*3/uL (ref 0.0–0.1)
Basophils Relative: 1.2 % (ref 0.0–3.0)
Eosinophils Absolute: 0.1 10*3/uL (ref 0.0–0.7)
Eosinophils Relative: 3.2 % (ref 0.0–5.0)
HCT: 38.4 % (ref 36.0–46.0)
Hemoglobin: 13.1 g/dL (ref 12.0–15.0)
Lymphocytes Relative: 34.9 % (ref 12.0–46.0)
Lymphs Abs: 1.3 10*3/uL (ref 0.7–4.0)
MCHC: 34 g/dL (ref 30.0–36.0)
MCV: 90.7 fl (ref 78.0–100.0)
Monocytes Absolute: 0.5 10*3/uL (ref 0.1–1.0)
Monocytes Relative: 14.8 % — ABNORMAL HIGH (ref 3.0–12.0)
Neutro Abs: 1.7 10*3/uL (ref 1.4–7.7)
Neutrophils Relative %: 45.9 % (ref 43.0–77.0)
Platelets: 264 10*3/uL (ref 150.0–400.0)
RBC: 4.23 Mil/uL (ref 3.87–5.11)
RDW: 13.8 % (ref 11.5–15.5)
WBC: 3.7 10*3/uL — ABNORMAL LOW (ref 4.0–10.5)

## 2015-08-22 LAB — VITAMIN D 25 HYDROXY (VIT D DEFICIENCY, FRACTURES): VITD: 40.3 ng/mL (ref 30.00–100.00)

## 2015-08-22 LAB — LIPID PANEL
Cholesterol: 208 mg/dL — ABNORMAL HIGH (ref 0–200)
HDL: 77.5 mg/dL (ref >39.00)
LDL Cholesterol: 114 mg/dL — ABNORMAL HIGH (ref 0–99)
NonHDL: 130.46
Total CHOL/HDL Ratio: 3
Triglycerides: 80 mg/dL (ref 0.0–149.0)
VLDL: 16 mg/dL (ref 0.0–40.0)

## 2015-08-22 LAB — TSH: TSH: 1.03 u[IU]/mL (ref 0.35–4.50)

## 2015-08-22 NOTE — Patient Instructions (Signed)
Health Maintenance, Female Adopting a healthy lifestyle and getting preventive care can go a long way to promote health and wellness. Talk with your health care provider about what schedule of regular examinations is right for you. This is a good chance for you to check in with your provider about disease prevention and staying healthy. In between checkups, there are plenty of things you can do on your own. Experts have done a lot of research about which lifestyle changes and preventive measures are most likely to keep you healthy. Ask your health care provider for more information. WEIGHT AND DIET  Eat a healthy diet  Be sure to include plenty of vegetables, fruits, low-fat dairy products, and lean protein.  Do not eat a lot of foods high in solid fats, added sugars, or salt.  Get regular exercise. This is one of the most important things you can do for your health.  Most adults should exercise for at least 150 minutes each week. The exercise should increase your heart rate and make you sweat (moderate-intensity exercise).  Most adults should also do strengthening exercises at least twice a week. This is in addition to the moderate-intensity exercise.  Maintain a healthy weight  Body mass index (BMI) is a measurement that can be used to identify possible weight problems. It estimates body fat based on height and weight. Your health care provider can help determine your BMI and help you achieve or maintain a healthy weight.  For females 20 years of age and older:   A BMI below 18.5 is considered underweight.  A BMI of 18.5 to 24.9 is normal.  A BMI of 25 to 29.9 is considered overweight.  A BMI of 30 and above is considered obese.  Watch levels of cholesterol and blood lipids  You should start having your blood tested for lipids and cholesterol at 54 years of age, then have this test every 5 years.  You may need to have your cholesterol levels checked more often if:  Your lipid  or cholesterol levels are high.  You are older than 54 years of age.  You are at high risk for heart disease.  CANCER SCREENING   Lung Cancer  Lung cancer screening is recommended for adults 55-80 years old who are at high risk for lung cancer because of a history of smoking.  A yearly low-dose CT scan of the lungs is recommended for people who:  Currently smoke.  Have quit within the past 15 years.  Have at least a 30-pack-year history of smoking. A pack year is smoking an average of one pack of cigarettes a day for 1 year.  Yearly screening should continue until it has been 15 years since you quit.  Yearly screening should stop if you develop a health problem that would prevent you from having lung cancer treatment.  Breast Cancer  Practice breast self-awareness. This means understanding how your breasts normally appear and feel.  It also means doing regular breast self-exams. Let your health care provider know about any changes, no matter how small.  If you are in your 20s or 30s, you should have a clinical breast exam (CBE) by a health care provider every 1-3 years as part of a regular health exam.  If you are 40 or older, have a CBE every year. Also consider having a breast X-ray (mammogram) every year.  If you have a family history of breast cancer, talk to your health care provider about genetic screening.  If you   are at high risk for breast cancer, talk to your health care provider about having an MRI and a mammogram every year.  Breast cancer gene (BRCA) assessment is recommended for women who have family members with BRCA-related cancers. BRCA-related cancers include:  Breast.  Ovarian.  Tubal.  Peritoneal cancers.  Results of the assessment will determine the need for genetic counseling and BRCA1 and BRCA2 testing. Cervical Cancer Your health care provider may recommend that you be screened regularly for cancer of the pelvic organs (ovaries, uterus, and  vagina). This screening involves a pelvic examination, including checking for microscopic changes to the surface of your cervix (Pap test). You may be encouraged to have this screening done every 3 years, beginning at age 21.  For women ages 30-65, health care providers may recommend pelvic exams and Pap testing every 3 years, or they may recommend the Pap and pelvic exam, combined with testing for human papilloma virus (HPV), every 5 years. Some types of HPV increase your risk of cervical cancer. Testing for HPV may also be done on women of any age with unclear Pap test results.  Other health care providers may not recommend any screening for nonpregnant women who are considered low risk for pelvic cancer and who do not have symptoms. Ask your health care provider if a screening pelvic exam is right for you.  If you have had past treatment for cervical cancer or a condition that could lead to cancer, you need Pap tests and screening for cancer for at least 20 years after your treatment. If Pap tests have been discontinued, your risk factors (such as having a new sexual partner) need to be reassessed to determine if screening should resume. Some women have medical problems that increase the chance of getting cervical cancer. In these cases, your health care provider may recommend more frequent screening and Pap tests. Colorectal Cancer  This type of cancer can be detected and often prevented.  Routine colorectal cancer screening usually begins at 54 years of age and continues through 54 years of age.  Your health care provider may recommend screening at an earlier age if you have risk factors for colon cancer.  Your health care provider may also recommend using home test kits to check for hidden blood in the stool.  A small camera at the end of a tube can be used to examine your colon directly (sigmoidoscopy or colonoscopy). This is done to check for the earliest forms of colorectal  cancer.  Routine screening usually begins at age 50.  Direct examination of the colon should be repeated every 5-10 years through 54 years of age. However, you may need to be screened more often if early forms of precancerous polyps or small growths are found. Skin Cancer  Check your skin from head to toe regularly.  Tell your health care provider about any new moles or changes in moles, especially if there is a change in a mole's shape or color.  Also tell your health care provider if you have a mole that is larger than the size of a pencil eraser.  Always use sunscreen. Apply sunscreen liberally and repeatedly throughout the day.  Protect yourself by wearing long sleeves, pants, a wide-brimmed hat, and sunglasses whenever you are outside. HEART DISEASE, DIABETES, AND HIGH BLOOD PRESSURE   High blood pressure causes heart disease and increases the risk of stroke. High blood pressure is more likely to develop in:  People who have blood pressure in the high end   of the normal range (130-139/85-89 mm Hg).  People who are overweight or obese.  People who are African American.  If you are 38-23 years of age, have your blood pressure checked every 3-5 years. If you are 61 years of age or older, have your blood pressure checked every year. You should have your blood pressure measured twice--once when you are at a hospital or clinic, and once when you are not at a hospital or clinic. Record the average of the two measurements. To check your blood pressure when you are not at a hospital or clinic, you can use:  An automated blood pressure machine at a pharmacy.  A home blood pressure monitor.  If you are between 45 years and 39 years old, ask your health care provider if you should take aspirin to prevent strokes.  Have regular diabetes screenings. This involves taking a blood sample to check your fasting blood sugar level.  If you are at a normal weight and have a low risk for diabetes,  have this test once every three years after 54 years of age.  If you are overweight and have a high risk for diabetes, consider being tested at a younger age or more often. PREVENTING INFECTION  Hepatitis B  If you have a higher risk for hepatitis B, you should be screened for this virus. You are considered at high risk for hepatitis B if:  You were born in a country where hepatitis B is common. Ask your health care provider which countries are considered high risk.  Your parents were born in a high-risk country, and you have not been immunized against hepatitis B (hepatitis B vaccine).  You have HIV or AIDS.  You use needles to inject street drugs.  You live with someone who has hepatitis B.  You have had sex with someone who has hepatitis B.  You get hemodialysis treatment.  You take certain medicines for conditions, including cancer, organ transplantation, and autoimmune conditions. Hepatitis C  Blood testing is recommended for:  Everyone born from [birth year] through 1964.  Anyone with known risk factors for hepatitis C. Sexually transmitted infections (STIs)  You should be screened for sexually transmitted infections (STIs) including gonorrhea and chlamydia if:  You are sexually active and are younger than 54 years of age.  You are older than 53 years of age and your health care provider tells you that you are at risk for this type of infection.  Your sexual activity has changed since you were last screened and you are at an increased risk for chlamydia or gonorrhea. Ask your health care provider if you are at risk.  If you do not have HIV, but are at risk, it may be recommended that you take a prescription medicine daily to prevent HIV infection. This is called pre-exposure prophylaxis (PrEP). You are considered at risk if:  You are sexually active and do not regularly use condoms or know the HIV status of your partner(s).  You take drugs by injection.  You are sexually  active with a partner who has HIV. Talk with your health care provider about whether you are at high risk of being infected with HIV. If you choose to begin PrEP, you should first be tested for HIV. You should then be tested every 3 months for as long as you are taking PrEP.  PREGNANCY   If you are premenopausal and you may become pregnant, ask your health care provider about preconception counseling.  If you may  become pregnant, take 400 to 800 micrograms (mcg) of folic acid every day.  If you want to prevent pregnancy, talk to your health care provider about birth control (contraception). OSTEOPOROSIS AND MENOPAUSE   Osteoporosis is a disease in which the bones lose minerals and strength with aging. This can result in serious bone fractures. Your risk for osteoporosis can be identified using a bone density scan.  If you are 61 years of age or older, or if you are at risk for osteoporosis and fractures, ask your health care provider if you should be screened.  Ask your health care provider whether you should take a calcium or vitamin D supplement to lower your risk for osteoporosis.  Menopause may have certain physical symptoms and risks.  Hormone replacement therapy may reduce some of these symptoms and risks. Talk to your health care provider about whether hormone replacement therapy is right for you.  HOME CARE INSTRUCTIONS   Schedule regular health, dental, and eye exams.  Stay current with your immunizations.   Do not use any tobacco products including cigarettes, chewing tobacco, or electronic cigarettes.  If you are pregnant, do not drink alcohol.  If you are breastfeeding, limit how much and how often you drink alcohol.  Limit alcohol intake to no more than 1 drink per day for nonpregnant women. One drink equals 12 ounces of beer, 5 ounces of wine, or 1 ounces of hard liquor.  Do not use street drugs.  Do not share needles.  Ask your health care provider for help if  you need support or information about quitting drugs.  Tell your health care provider if you often feel depressed.  Tell your health care provider if you have ever been abused or do not feel safe at home.   This information is not intended to replace advice given to you by your health care provider. Make sure you discuss any questions you have with your health care provider.   Document Released: 07/24/2010 Document Revised: 01/29/2014 Document Reviewed: 12/10/2012 Elsevier Interactive Patient Education [employer].

## 2015-08-22 NOTE — Progress Notes (Signed)
Subjective:    Patient ID: Jodi Anderson, female    DOB: November 28, 1960, 55 y.o.   MRN: [id number]  HPI  55YO female presents for physical exam.  Last PAP 2015 normal, HPV neg. Mammogram 11/2014 BIRADS 1.  Feeling well. Having some issues with plantar fasciitis. Followed by podiatry and plans to see chiropractor.    Wt Readings from Last 3 Encounters:  08/22/15 123 lb 6.4 oz (56 kg)  08/01/15 120 lb (54.4 kg)  12/13/14 124 lb 8 oz (56.5 kg)   BP Readings from Last 3 Encounters:  08/22/15 102/64  08/01/15 100/60  12/13/14 106/70    No past medical history on file. Family History  Problem Relation Age of Onset  . Epilepsy Son   . Cancer Paternal Grandmother     breast  . Cancer Maternal Aunt     lung   Past Surgical History:  Procedure Laterality Date  . CESAREAN SECTION     3x  . tummy tuck    . VAGINAL DELIVERY     Social History   Social History  . Marital status: Married    Spouse name: N/A  . Number of children: N/A  . Years of education: N/A   Social History Main Topics  . Smoking status: Never Smoker  . Smokeless tobacco: Never Used  . Alcohol use Yes  . Drug use: No  . Sexual activity: Not Asked   Other Topics Concern  . None   Social History Narrative   Lives in [place]. 4 children, 1 son at home has epilepsy, son in 10th grade. Has dog and 3 cats.      Work - homemaker      Diet - regular diet      Exercise - walks daily, weights, trampoline    Review of Systems  Constitutional: Negative for appetite change, chills, fatigue, fever and unexpected weight change.  HENT: Negative for congestion.   Eyes: Negative for visual disturbance.  Respiratory: Negative for shortness of breath.   Cardiovascular: Negative for chest pain and leg swelling.  Gastrointestinal: Negative for abdominal pain, constipation, diarrhea, nausea and vomiting.  Genitourinary: Negative for dysuria, frequency, pelvic pain and urgency.  Musculoskeletal: Positive  for arthralgias and myalgias.  Skin: Negative for color change and rash.  Neurological: Negative for weakness.  Hematological: Negative for adenopathy. Does not bruise/bleed easily.  Psychiatric/Behavioral: Negative for dysphoric mood. The patient is not nervous/anxious.        Objective:    BP 102/64 (BP Location: Left Arm, Patient Position: Sitting, Cuff Size: Large)   Pulse 62   Ht 5' 1.75" (1.568 m)   Wt 123 lb 6.4 oz (56 kg)   SpO2 98%   BMI 22.75 kg/m  Physical Exam  Constitutional: She is oriented to person, place, and time. She appears well-developed and well-nourished. No distress.  HENT:  Head: Normocephalic and atraumatic.  Right Ear: External ear normal.  Left Ear: External ear normal.  Nose: Nose normal.  Mouth/Throat: Oropharynx is clear and moist. No oropharyngeal exudate.  Eyes: Conjunctivae are normal. Pupils are equal, round, and reactive to light. Right eye exhibits no discharge. Left eye exhibits no discharge. No scleral icterus.  Neck: Normal range of motion. Neck supple. No tracheal deviation present. No thyromegaly present.  Cardiovascular: Normal rate, regular rhythm, normal heart sounds and intact distal pulses.  Exam reveals no gallop and no friction rub.   No murmur heard. Pulmonary/Chest: Effort normal and breath sounds normal. No accessory muscle  usage. No tachypnea. No respiratory distress. She has no decreased breath sounds. She has no wheezes. She has no rales. She exhibits no tenderness. Right breast exhibits no inverted nipple, no mass, no nipple discharge, no skin change and no tenderness. Left breast exhibits no inverted nipple, no mass, no nipple discharge, no skin change and no tenderness. Breasts are symmetrical.  Abdominal: Soft. Bowel sounds are normal. She exhibits no distension and no mass. There is no tenderness. There is no rebound and no guarding.  Musculoskeletal: Normal range of motion. She exhibits no edema or tenderness.    Lymphadenopathy:    She has no cervical adenopathy.  Neurological: She is alert and oriented to person, place, and time. No cranial nerve deficit. She exhibits normal muscle tone. Coordination normal.  Skin: Skin is warm and dry. No rash noted. She is not diaphoretic. No erythema. No pallor.  Psychiatric: She has a normal mood and affect. Her behavior is normal. Judgment and thought content normal.          Assessment & Plan:   Problem List Items Addressed This Visit      Unprioritized   Routine general medical examination at a health care facility - Primary (Chronic)    General medical exam normal today including breast exam. PAP and pelvic deferred as normal in 2015, HPV neg. Mammogram ordered. Colonoscopy UTD. Immunizations UTD. Labs today. Encouraged healthy diet and exercise.      Relevant Orders   CBC with Differential/Platelet   Comprehensive metabolic panel   Lipid panel   VITAMIN D 25 Hydroxy (Vit-D Deficiency, Fractures)   TSH   MM Digital Screening   Hepatitis C antibody    Other Visit Diagnoses   None.      Return in about 3 months (around 11/22/2015) for New Patient.  Ronna Polio, MD Internal Medicine [facility]

## 2015-08-22 NOTE — Assessment & Plan Note (Signed)
General medical exam normal today including breast exam. PAP and pelvic deferred as normal in 2015, HPV neg. Mammogram ordered. Colonoscopy UTD. Immunizations UTD. Labs today. Encouraged healthy diet and exercise.

## 2015-08-23 ENCOUNTER — Other Ambulatory Visit: Payer: Self-pay | Admitting: *Deleted

## 2015-08-23 LAB — HEPATITIS C ANTIBODY: HCV Ab: NEGATIVE

## 2015-08-23 MED ORDER — METAXALONE 800 MG PO TABS: 800.0000 mg | ORAL_TABLET | Freq: Two times a day (BID) | ORAL | 1 refills | Status: DC

## 2015-08-29 ENCOUNTER — Ambulatory Visit: Payer: Commercial Managed Care - HMO | Admitting: Sports Medicine

## 2015-11-07 ENCOUNTER — Telehealth: Payer: Self-pay | Admitting: Internal Medicine

## 2015-11-07 NOTE — Telephone Encounter (Signed)
I see her 10/31.  Can she wait until then?   If not, you may give her a 2 week supply of each medication.   Note: GFR 86

## 2015-11-07 NOTE — Telephone Encounter (Signed)
Pt needs a refill on arthotec 75 mg as needed,  metaxalone (SKELAXIN) 800 MG tablet, zolpidem (AMBIEN) 10 MG tablet. Please advise, thank you!  Call pt @ [phone] [facility] [address] [place] - [id number]

## 2015-11-07 NOTE — Telephone Encounter (Signed)
Last OV 7/31 with Dr. Dan HumphreysWalker.  Metaxalone last filled on 08/23/15 Arthrotec 06/28/15 ambien 07/18/15  Ok to refill?

## 2015-11-10 NOTE — Telephone Encounter (Signed)
Left detailed message.   

## 2015-11-22 ENCOUNTER — Ambulatory Visit: Payer: Commercial Managed Care - HMO | Admitting: Family

## 2015-12-13 ENCOUNTER — Telehealth: Payer: Self-pay | Admitting: Family

## 2015-12-13 DIAGNOSIS — G4700 Insomnia, unspecified: Secondary | ICD-10-CM

## 2015-12-13 NOTE — Telephone Encounter (Signed)
Refill request for Ambien, last seen 31JUL2017, last filled 26JUN2017.  Please advise. Patient canceled last appointment. Please review prior notes.

## 2015-12-13 NOTE — Telephone Encounter (Signed)
Pt called and is requesting a refill on zolpidem (AMBIEN) 10 MG tablet. Please advise, thank you!  Pharmacy - [facility] [place], [place] - [id number]  Call pt @ [phone]

## 2015-12-14 MED ORDER — ZOLPIDEM TARTRATE 10 MG PO TABS: ORAL_TABLET | ORAL | 0 refills | Status: DC

## 2015-12-14 NOTE — Telephone Encounter (Signed)
Call pt-  Please remind patient that Remus Lofflerambien is a controlled substance and she is on a high dose  ( I normally prescribe 5mg ).  2 week supply.  Last refill until patient establishes care.

## 2015-12-20 NOTE — Telephone Encounter (Signed)
Patient has been informed. Also appointment has been rescheduled for [id number]

## 2016-01-02 ENCOUNTER — Encounter: Payer: Self-pay | Admitting: Family

## 2016-01-02 ENCOUNTER — Ambulatory Visit (INDEPENDENT_AMBULATORY_CARE_PROVIDER_SITE_OTHER): Payer: Commercial Managed Care - HMO | Admitting: Family

## 2016-01-02 VITALS — BP 98/62 | HR 68 | Temp 98.5°F | Ht 62.0 in | Wt 125.6 lb

## 2016-01-02 DIAGNOSIS — M79662 Pain in left lower leg: Secondary | ICD-10-CM

## 2016-01-02 DIAGNOSIS — G4700 Insomnia, unspecified: Secondary | ICD-10-CM

## 2016-01-02 DIAGNOSIS — D649 Anemia, unspecified: Secondary | ICD-10-CM | POA: Insufficient documentation

## 2016-01-02 NOTE — Patient Instructions (Signed)
Pleasure meeting you As discussed, I would call Dr Velora Heckleraller to follow your knee pain; may be degenerative in nature and likely you would benefit from an injection  Happy holidays!

## 2016-01-02 NOTE — Assessment & Plan Note (Addendum)
Etiology of numbness is unclear. Considering degeneration of left knee, chronic meniscal injury may be playing role. Also considering chronic fascitis may be playing a role from compensating. Patient and I agreed that she would call Dr. Margaretha Sheffieldraper as may benefit from joint injection and further evaluation.

## 2016-01-02 NOTE — Progress Notes (Signed)
Subjective:    Patient ID: Jodi Anderson, female    DOB: 01/09/1961, 55 y.o.   MRN: [id number]  CC: Jodi Clinchmily R Figler is a 55 y.o. female who presents today for follow up.   HPI: Insomnia- Uses it most nights, husband snores. Cuts tablet into 3's and only takes 1/3. No anxiety or strange dreams.  Left calf numbness tingling for past couple of months, unchanged; h/o plantar fasccitis in left foot . Has seen sports med couple of months ago. Following with chiropracter and feeling better in left neck and leg. Numbness starts in knee to ankle, worse in the morning. Endorses left knee giving way sensation. Long walks do not bother knee or calk. No pain in heel since injection for plantar fasccitis.    Would like to have anemia checked. No symptoms.     HISTORY:  No past medical history on file. Past Surgical History:  Procedure Laterality Date  . CESAREAN SECTION     3x  . tummy tuck    . VAGINAL DELIVERY     Family History  Problem Relation Age of Onset  . Epilepsy Son   . Cancer Paternal Grandmother     breast  . Cancer Maternal Aunt     lung    Allergies: Codeine Current Outpatient Prescriptions on File Prior to Visit  Medication Sig Dispense Refill  . Diclofenac-Misoprostol 75-0.2 MG TBEC TAKE ONE TABLET TWICE A DAY AS NEEDED 30 tablet 0  . ferrous sulfate 325 (65 FE) MG tablet Take by mouth.    . ferrous sulfate 325 (65 FE) MG tablet Take by mouth.    . levocetirizine (XYZAL) 5 MG tablet Take 1 tablet (5 mg total) by mouth every evening. 90 tablet 0  . metaxalone (SKELAXIN) 800 MG tablet Take 1 tablet (800 mg total) by mouth 2 (two) times daily. 60 tablet 1  . montelukast (SINGULAIR) 10 MG tablet Take 1 tablet (10 mg total) by mouth at bedtime. 90 tablet 0  . triamcinolone ointment (KENALOG) 0.5 % Apply 1 application topically 2 (two) times daily. 30 g 0  . valACYclovir (VALTREX) 1000 MG tablet Take 1 tablet (1,000 mg total) by mouth 3 (three) times daily. 21 tablet 0  .  zolpidem (AMBIEN) 10 MG tablet TAKE ONE TABLET AT BEDTIME IF NEEDED FOR INSOMNIA. 14 tablet 0   No current facility-administered medications on file prior to visit.     Social History  Substance Use Topics  . Smoking status: Never Smoker  . Smokeless tobacco: Never Used  . Alcohol use Yes    Review of Systems  Constitutional: Negative for chills and fever.  Respiratory: Negative for cough and shortness of breath.   Cardiovascular: Negative for chest pain and palpitations.  Gastrointestinal: Negative for nausea and vomiting.  Musculoskeletal: Negative for joint swelling.  Neurological: Negative for dizziness.      Objective:    BP 98/62   Pulse 68   Temp 98.5 F (36.9 C) (Oral)   Ht 5\' 2"  (1.575 m)   Wt 125 lb 9.6 oz (57 kg)   SpO2 97%   BMI 22.97 kg/m  BP Readings from Last 3 Encounters:  01/02/16 98/62  08/22/15 102/64  08/01/15 100/60   Wt Readings from Last 3 Encounters:  01/02/16 125 lb 9.6 oz (57 kg)  08/22/15 123 lb 6.4 oz (56 kg)  08/01/15 120 lb (54.4 kg)    Physical Exam  Constitutional: She appears well-developed and well-nourished.  Eyes: Conjunctivae are normal.  Cardiovascular: Normal rate, regular rhythm, normal heart sounds and normal pulses.   No LE edema, palpable cords or masses. No erythema or increased warmth. No asymmetry in calf size when compared bilaterally LE warm and palpable pedal pulses.   Pulmonary/Chest: Effort normal and breath sounds normal. She has no wheezes. She has no rhonchi. She has no rales.  Musculoskeletal:       Left knee: She exhibits normal range of motion, no swelling, no effusion and no bony tenderness. No tenderness found.  No pain with palpation of left calf.     Neurological: She is alert.  Sensation intact BLE.    Skin: Skin is warm and dry.  Psychiatric: She has a normal mood and affect. Her speech is normal and behavior is normal. Thought content normal.  Vitals reviewed.      Assessment & Plan:    Problem List Items Addressed This Visit      Other   Insomnia    Chronic. Discussed risk of Ambien. Patient and I had a long discussion about Ambien controlled substances and the adverse effects.We jointly agreed that she would try to use less of the medication and not rely on it every night. I advised her she can even trial over-the-counter melatonin to see if this helps. We also discussed sleep hygiene. We'll follow. New CSC.      Anemia - Primary    Asymptomatic. Last CBC was normal. Pending CBC, ferritin, B12.      Relevant Orders   CBC with Differential/Platelet   B12 and Folate Panel   IBC panel   Pain of left calf    Etiology of numbness is unclear. Considering degeneration of left knee, chronic meniscal injury may be playing role. Also considering chronic fascitis may be playing a role from compensating. Patient and I agreed that she would call Dr. Margaretha Sheffieldraper as may benefit from joint injection and further evaluation.           I am having [name] maintain her ferrous sulfate, triamcinolone ointment, levocetirizine, montelukast, valACYclovir, Diclofenac-Misoprostol, ferrous sulfate, metaxalone, and zolpidem.   No orders of the defined types were placed in this encounter.   Return precautions given.   Risks, benefits, and alternatives of the medications and treatment plan prescribed today were discussed, and patient expressed understanding.   Education regarding symptom management and diagnosis given to patient on AVS.  Continue to follow with Rennie PlowmanMargaret Antonique Langford, FNP for routine health maintenance.   Jodi ClinchEmily R Anderson and I agreed with plan.   Rennie PlowmanMargaret Margaruite Top, FNP  Total of 25 minutes spent with patient, greater than 50% of which was spent in discussion of  Insomnia and sleep hygiene.

## 2016-01-02 NOTE — Assessment & Plan Note (Signed)
Asymptomatic. Last CBC was normal. Pending CBC, ferritin, B12.

## 2016-01-02 NOTE — Progress Notes (Signed)
Pre visit review using our clinic review tool, if applicable. No additional management support is needed unless otherwise documented below in the visit note. 

## 2016-01-02 NOTE — Assessment & Plan Note (Signed)
Chronic. Discussed risk of Ambien. Patient and I had a long discussion about Ambien controlled substances and the adverse effects.We jointly agreed that she would try to use less of the medication and not rely on it every night. I advised her she can even trial over-the-counter melatonin to see if this helps. We also discussed sleep hygiene. We'll follow. New CSC.

## 2016-01-03 LAB — CBC WITH DIFFERENTIAL/PLATELET
Basophils Absolute: 0 10*3/uL (ref 0.0–0.1)
Basophils Relative: 0.3 % (ref 0.0–3.0)
Eosinophils Absolute: 0.1 10*3/uL (ref 0.0–0.7)
Eosinophils Relative: 2.5 % (ref 0.0–5.0)
HCT: 39.1 % (ref 36.0–46.0)
Hemoglobin: 13.4 g/dL (ref 12.0–15.0)
Lymphocytes Relative: 30.7 % (ref 12.0–46.0)
Lymphs Abs: 1.5 10*3/uL (ref 0.7–4.0)
MCHC: 34.2 g/dL (ref 30.0–36.0)
MCV: 89.3 fl (ref 78.0–100.0)
Monocytes Absolute: 0.6 10*3/uL (ref 0.1–1.0)
Monocytes Relative: 12.3 % — ABNORMAL HIGH (ref 3.0–12.0)
Neutro Abs: 2.7 10*3/uL (ref 1.4–7.7)
Neutrophils Relative %: 54.2 % (ref 43.0–77.0)
Platelets: 287 10*3/uL (ref 150.0–400.0)
RBC: 4.38 Mil/uL (ref 3.87–5.11)
RDW: 12.6 % (ref 11.5–15.5)
WBC: 4.9 10*3/uL (ref 4.0–10.5)

## 2016-01-03 LAB — B12 AND FOLATE PANEL
Folate: 7.3 ng/mL (ref >5.9)
Vitamin B-12: 190 pg/mL — ABNORMAL LOW (ref 211–911)

## 2016-01-03 LAB — IBC PANEL
Iron: 115 ug/dL (ref 42–145)
Saturation Ratios: 30.3 % (ref 20.0–50.0)
Transferrin: 271 mg/dL (ref 212.0–360.0)

## 2016-01-05 ENCOUNTER — Other Ambulatory Visit: Payer: Self-pay | Admitting: Family

## 2016-01-30 ENCOUNTER — Ambulatory Visit: Payer: Commercial Managed Care - HMO | Admitting: Sports Medicine

## 2016-01-31 ENCOUNTER — Ambulatory Visit (INDEPENDENT_AMBULATORY_CARE_PROVIDER_SITE_OTHER): Payer: Commercial Managed Care - HMO | Admitting: Sports Medicine

## 2016-01-31 ENCOUNTER — Encounter: Payer: Self-pay | Admitting: Sports Medicine

## 2016-01-31 VITALS — BP 104/64 | Ht 62.0 in | Wt 125.0 lb

## 2016-01-31 DIAGNOSIS — M542 Cervicalgia: Secondary | ICD-10-CM | POA: Diagnosis not present

## 2016-01-31 DIAGNOSIS — M791 Myalgia, unspecified site: Secondary | ICD-10-CM

## 2016-01-31 DIAGNOSIS — M5416 Radiculopathy, lumbar region: Secondary | ICD-10-CM | POA: Diagnosis not present

## 2016-01-31 NOTE — Patient Instructions (Signed)
[facility] [address], [place], [place] [address] Hours: 7:30AM-12:30PM, 1:30-4:30PM Phone: [phone]

## 2016-02-01 ENCOUNTER — Ambulatory Visit
Admission: RE | Admit: 2016-02-01 | Discharge: 2016-02-01 | Disposition: A | Discharge status: Home / Self Care | Payer: Commercial Managed Care - HMO | Source: Ambulatory Visit | Attending: Sports Medicine | Admitting: Sports Medicine

## 2016-02-01 ENCOUNTER — Other Ambulatory Visit: Payer: Self-pay | Admitting: Family

## 2016-02-01 DIAGNOSIS — M791 Myalgia: Secondary | ICD-10-CM | POA: Diagnosis not present

## 2016-02-01 DIAGNOSIS — G4700 Insomnia, unspecified: Secondary | ICD-10-CM

## 2016-02-01 DIAGNOSIS — M545 Low back pain: Secondary | ICD-10-CM | POA: Diagnosis not present

## 2016-02-01 DIAGNOSIS — M79672 Pain in left foot: Secondary | ICD-10-CM | POA: Diagnosis not present

## 2016-02-01 DIAGNOSIS — M5416 Radiculopathy, lumbar region: Secondary | ICD-10-CM

## 2016-02-01 DIAGNOSIS — M542 Cervicalgia: Secondary | ICD-10-CM

## 2016-02-01 DIAGNOSIS — M47812 Spondylosis without myelopathy or radiculopathy, cervical region: Secondary | ICD-10-CM | POA: Diagnosis not present

## 2016-02-01 NOTE — Progress Notes (Signed)
Subjective:    Patient ID: Jodi Anderson, female    DOB: 07/26/1960, 56 y.o.   MRN: [id number]  HPI chief complaint: Left foot and neck pain  Patient comes in today with a couple of different complaints. Primary complaint is persistent left foot pain. She was initially seen in our office back in July and diagnosed with lateral column overload of the left foot secondary to an altered gait from plantar fasciitis. Patient states that her lateral left foot pain never resolved. Her heel pain did resolve. She describes a constant aching type discomfort along the lateral left foot that is associated with radiating pain as well as some numbness and tingling up the lateral aspect of the left lower leg. In fact, she states that the lateral aspect of the left lower leg and the left lateral foot are constantly numb. She denies any weakness. Denies any low back pain. Symptoms tend to be worse with activity. She is also complaining of long-standing neck pain. She localizes the pain to the right side of her neck. She initially had problems with her neck 10 years ago but chiropractic treatment resolved it. Her most recent episode started back in September. She awoke one morning with a "crick in my neck". She has had persistent right-sided neck pain with limited range of motion ever since. She denies any pain into her arms. No numbness or tingling in her arms. No weakness. She has been working with a Industrial/product designerlocal chiropractor and has had some limited benefit with this. She has not had any recent imaging but was told 10 years ago that an x-ray showed some spurring in her cervical spine.  Past medical history reviewed Medications reviewed Allergies reviewed    Review of Systems    as above Objective:   Physical Exam  Well-developed, well-nourished. No acute distress. Awake alert and oriented 3. Vital signs reviewed  Cervical spine: Patient has limited cervical rotation to the right by about 50%. Otherwise, she has  full painless cervical range of motion. She is tender to palpation along the right paraspinal musculature. No spasm. Negative Spurling's. Neurological exam shows 5/5 strength in both upper extremities. Reflexes are equal at the biceps, triceps, and brachial radialis tendons. Sensation is intact to light touch grossly. No atrophy.  Lumbar spine: Full painless range of motion. No tenderness to palpation over the lumbar midline. No spasm. She has 4+/5 strength with resisted great toe extension on the left compared to 5/5 on the right. Remainder of her strength is 5/5 bilaterally. Reflexes are equal at the Achilles and patellar tendons bilaterally. She has decreased sensation to light touch along the lateral left foot. No noticeable atrophy.  Left foot: There is no tenderness to palpation at the calcaneal origin of the plantar fascia. There is no tenderness to palpation along the lateral foot. No soft tissue swelling. Negative calcaneal squeeze. Good dorsalis pedis and posterior tibial pulses. Negative Tinel's over the tarsal tunnel.      Assessment & Plan:   Neck pain likely secondary to cervical degenerative disc disease versus facet arthropathy Left lower leg pain and numbness-rule out lumbar radiculopathy  I will get x-rays of her cervical spine to evaluate for degenerative changes such as degenerative disc disease or facet arthropathy. The etiology of her ongoing left lower leg and lateral foot pain and numbness is not straightforward. It is certainly not plantar fasciitis or lateral column overload. I would like to start with getting an x-ray of her lumbar spine. I will  also proceed with an MRI of her lumbar spine specifically to rule out a lumbar disc herniation at the L5-S1 level. I would also like to get a plain x-ray of her left foot as well as some blood work including a sedimentation rate, C-reactive protein, ANA, rheumatoid factor, and anti-CCP. Once I have the results of these studies I will  contact the patient. If the MRI is not diagnostic, then I'll consider EMG/nerve conduction studies at that time.

## 2016-02-02 ENCOUNTER — Telehealth: Payer: Self-pay | Admitting: Sports Medicine

## 2016-02-02 ENCOUNTER — Telehealth: Payer: Self-pay | Admitting: Family

## 2016-02-02 DIAGNOSIS — Z760 Encounter for issue of repeat prescription: Secondary | ICD-10-CM

## 2016-02-02 DIAGNOSIS — G4700 Insomnia, unspecified: Secondary | ICD-10-CM

## 2016-02-02 MED ORDER — ZOLPIDEM TARTRATE 10 MG PO TABS: ORAL_TABLET | ORAL | 0 refills | Status: DC

## 2016-02-02 MED ORDER — DICLOFENAC-MISOPROSTOL 75-0.2 MG PO TBEC: DELAYED_RELEASE_TABLET | ORAL | 0 refills | Status: DC

## 2016-02-02 MED ORDER — METAXALONE 800 MG PO TABS: 800.0000 mg | ORAL_TABLET | Freq: Two times a day (BID) | ORAL | 1 refills | Status: DC

## 2016-02-02 NOTE — Telephone Encounter (Signed)
Refill request, last seen 11DEC2017, last filled Nov2017.  Please advise.

## 2016-02-02 NOTE — Telephone Encounter (Signed)
Pt called and is requesting a refill on metaxalone (SKELAXIN) 800 MG tablet, zolpidem (AMBIEN) 10 MG tablet, and Diclofenac-Misoprostol 75-0.2 MG TBEC. She is going out of town tomorrow and needs them refilled for her trip. Please advise, thank you!  Pharmacy - [facility] [place], [place] - [id number]  Call pt @ [phone]

## 2016-02-02 NOTE — Telephone Encounter (Signed)
  I spoke with the patient yesterday on the phone after reviewing x-rays of her lumbar spine, left foot, and cervical spine. X-rays of the left foot are fairly unremarkable. She does have a spur at the origin of her plantar fascia. She also has a metallic orthopedic anchor in the distal phalanx of her great toe which is consistent with prior toe surgery. Her cervical spine x-ray shows facet arthropathy from C4-C6. She also has some vertebral endplate spurring from C4-C7. Some mild reversal of the normal lordosis. X-rays of her lumbar spine shows advanced degenerative disc disease at L5-S1 with facet arthropathy at this level as well. The remainder of her lumbar spine appears quite healthy.  She is reassured that her foot x-rays are normal. I think that the neck pain she is experiencing is coming from her facet arthropathy in her cervical spine. She will continue with chiropractic treatment for the time being with the understanding that we can consider facet injections at a later date if needed. In regards to her lumbar spine, the degenerative changes seen on the x-ray correlate with her physical exam findings. She will go ahead and proceed with the MRI as scheduled specifically to rule out stenosis at this level which may be contributing to her chronic leg pain. Of note, we did confirm that the metallic anchor in her left foot would not preclude her from her upcoming MRI.

## 2016-02-06 ENCOUNTER — Other Ambulatory Visit: Payer: Commercial Managed Care - HMO

## 2016-02-13 ENCOUNTER — Ambulatory Visit: Payer: Commercial Managed Care - HMO | Admitting: Family

## 2016-02-14 ENCOUNTER — Encounter: Payer: Self-pay | Admitting: Sports Medicine

## 2016-02-14 ENCOUNTER — Telehealth: Payer: Self-pay | Admitting: *Deleted

## 2016-02-14 DIAGNOSIS — M542 Cervicalgia: Secondary | ICD-10-CM

## 2016-02-14 NOTE — Telephone Encounter (Signed)
MRI Cervical Spine order as well.   Pt will call to schedule

## 2016-02-23 ENCOUNTER — Ambulatory Visit
Admission: RE | Admit: 2016-02-23 | Discharge: 2016-02-23 | Disposition: A | Discharge status: Home / Self Care | Payer: Commercial Managed Care - HMO | Source: Ambulatory Visit | Attending: Sports Medicine | Admitting: Sports Medicine

## 2016-02-23 DIAGNOSIS — M542 Cervicalgia: Secondary | ICD-10-CM

## 2016-02-23 DIAGNOSIS — M5416 Radiculopathy, lumbar region: Secondary | ICD-10-CM

## 2016-02-23 DIAGNOSIS — M5126 Other intervertebral disc displacement, lumbar region: Secondary | ICD-10-CM | POA: Diagnosis not present

## 2016-02-24 ENCOUNTER — Telehealth: Payer: Self-pay | Admitting: Sports Medicine

## 2016-02-24 NOTE — Telephone Encounter (Signed)
I spoke with Jodi Anderson on the phone today after reviewing MRI scans of both her cervical spine and her lumbar spine. Lumbar spine MRI shows advanced disc degeneration at L5-S1 with moderate bilateral neural foraminal stenosis. MRI of her cervical spine shows multilevel disc and advanced facet degeneration. Facet arthropathy is worse on the right at C4-C5 and C3-C4. She also has a bulging disc at C5-C6 which results in moderate left neural foraminal stenosis. She continues to complain of pain, numbness, and tingling down her left leg. She also continues to complain of neck pain which radiates down into her left arm. At this point in time, I've recommended [facility] diagnostic/therapeutic injections into both the lumbar spine and cervical spine. I think her cervical spine pain is likely due to a combination of facet arthropathy and left arm radiculopathy. I will leave it up to the treating radiologist to decide whether or not to first pursue a facet injection or an epidural injection in regards to her cervical spine. Patient will follow-up with me in the office after these procedures.

## 2016-02-27 ENCOUNTER — Other Ambulatory Visit: Payer: Self-pay | Admitting: *Deleted

## 2016-02-27 ENCOUNTER — Other Ambulatory Visit: Payer: Self-pay | Admitting: Sports Medicine

## 2016-02-27 DIAGNOSIS — M542 Cervicalgia: Secondary | ICD-10-CM

## 2016-02-27 DIAGNOSIS — M5416 Radiculopathy, lumbar region: Secondary | ICD-10-CM

## 2016-02-27 MED ORDER — DIAZEPAM 10 MG PO TABS: ORAL_TABLET | ORAL | 0 refills | Status: DC

## 2016-02-29 ENCOUNTER — Ambulatory Visit
Admission: RE | Admit: 2016-02-29 | Discharge: 2016-02-29 | Disposition: A | Discharge status: Home / Self Care | Payer: Commercial Managed Care - HMO | Source: Ambulatory Visit | Attending: Sports Medicine | Admitting: Sports Medicine

## 2016-02-29 ENCOUNTER — Other Ambulatory Visit: Payer: Self-pay | Admitting: Sports Medicine

## 2016-02-29 DIAGNOSIS — M542 Cervicalgia: Secondary | ICD-10-CM | POA: Diagnosis not present

## 2016-02-29 MED ORDER — IOPAMIDOL (ISOVUE-M 300) INJECTION 61%
1.0000 mL | Freq: Once | INTRAMUSCULAR | Status: AC | PRN
  Administered 2016-02-29: 1 mL via INTRA_ARTICULAR

## 2016-02-29 MED ORDER — DEXAMETHASONE SODIUM PHOSPHATE 4 MG/ML IJ SOLN
5.0000 mg | Freq: Once | INTRAMUSCULAR | Status: AC
  Administered 2016-02-29: 5.2 mg via INTRA_ARTICULAR

## 2016-02-29 NOTE — Discharge Instructions (Signed)

## 2016-03-01 ENCOUNTER — Other Ambulatory Visit: Payer: Self-pay | Admitting: *Deleted

## 2016-03-01 DIAGNOSIS — M25522 Pain in left elbow: Secondary | ICD-10-CM

## 2016-03-06 ENCOUNTER — Other Ambulatory Visit: Payer: Commercial Managed Care - HMO

## 2016-03-06 ENCOUNTER — Other Ambulatory Visit: Payer: Self-pay | Admitting: Sports Medicine

## 2016-03-06 DIAGNOSIS — M25522 Pain in left elbow: Secondary | ICD-10-CM

## 2016-03-12 ENCOUNTER — Ambulatory Visit
Admission: RE | Admit: 2016-03-12 | Discharge: 2016-03-12 | Disposition: A | Discharge status: Home / Self Care | Payer: Commercial Managed Care - HMO | Source: Ambulatory Visit | Attending: Sports Medicine | Admitting: Sports Medicine

## 2016-03-12 ENCOUNTER — Ambulatory Visit: Payer: Commercial Managed Care - HMO | Admitting: Sports Medicine

## 2016-03-12 DIAGNOSIS — M25522 Pain in left elbow: Secondary | ICD-10-CM

## 2016-03-12 DIAGNOSIS — M47817 Spondylosis without myelopathy or radiculopathy, lumbosacral region: Secondary | ICD-10-CM | POA: Diagnosis not present

## 2016-03-12 DIAGNOSIS — M25552 Pain in left hip: Secondary | ICD-10-CM | POA: Diagnosis not present

## 2016-03-12 DIAGNOSIS — M5416 Radiculopathy, lumbar region: Secondary | ICD-10-CM

## 2016-03-12 MED ORDER — IOPAMIDOL (ISOVUE-M 200) INJECTION 41%
1.0000 mL | Freq: Once | INTRAMUSCULAR | Status: AC
  Administered 2016-03-12: 1 mL via EPIDURAL

## 2016-03-12 MED ORDER — METHYLPREDNISOLONE ACETATE 40 MG/ML INJ SUSP (RADIOLOG
120.0000 mg | Freq: Once | INTRAMUSCULAR | Status: AC
  Administered 2016-03-12: 120 mg via EPIDURAL

## 2016-03-12 MED ORDER — IOPAMIDOL (ISOVUE-M 200) INJECTION 41%
1.0000 mL | Freq: Once | INTRAMUSCULAR | Status: AC
  Administered 2016-03-12: 1 mL via INTRA_ARTICULAR

## 2016-03-12 MED ORDER — METHYLPREDNISOLONE ACETATE 40 MG/ML INJ SUSP (RADIOLOG
120.0000 mg | Freq: Once | INTRAMUSCULAR | Status: AC
  Administered 2016-03-12: 120 mg via INTRA_ARTICULAR

## 2016-03-13 ENCOUNTER — Other Ambulatory Visit: Payer: Self-pay | Admitting: Internal Medicine

## 2016-03-13 DIAGNOSIS — G4700 Insomnia, unspecified: Secondary | ICD-10-CM

## 2016-03-14 ENCOUNTER — Other Ambulatory Visit: Payer: Commercial Managed Care - HMO

## 2016-03-26 ENCOUNTER — Other Ambulatory Visit: Payer: Commercial Managed Care - HMO

## 2016-03-26 NOTE — Telephone Encounter (Signed)
Please advise for refill, thanks 

## 2016-03-26 NOTE — Telephone Encounter (Signed)
Pt called requesting this refill. Please advise, thank you!  Call pt @ [phone]  [facility] [address] [facility] [facility] [place] - [id number]

## 2016-03-27 ENCOUNTER — Ambulatory Visit: Payer: Commercial Managed Care - HMO | Admitting: Sports Medicine

## 2016-03-27 ENCOUNTER — Ambulatory Visit (INDEPENDENT_AMBULATORY_CARE_PROVIDER_SITE_OTHER): Payer: Commercial Managed Care - HMO | Admitting: Sports Medicine

## 2016-03-27 ENCOUNTER — Encounter: Payer: Self-pay | Admitting: Sports Medicine

## 2016-03-27 VITALS — BP 104/60 | Ht 62.0 in | Wt 125.0 lb

## 2016-03-27 DIAGNOSIS — M542 Cervicalgia: Secondary | ICD-10-CM

## 2016-03-27 DIAGNOSIS — M545 Low back pain, unspecified: Secondary | ICD-10-CM

## 2016-03-27 DIAGNOSIS — G8929 Other chronic pain: Secondary | ICD-10-CM | POA: Diagnosis not present

## 2016-03-27 MED ORDER — DIAZEPAM 10 MG PO TABS: ORAL_TABLET | ORAL | 0 refills | Status: DC

## 2016-03-28 NOTE — Progress Notes (Signed)
   Subjective:    Patient ID: Jodi Anderson, female    DOB: 09/10/1960, 56 y.o.   MRN: [id number]  HPI   Jodi Anderson comes in today to discuss neck pain, left elbow pain, and left leg pain. She has undergone a right-sided C3-C4 facet injection as well as a left-sided L5-S1 epidural steroid injection and a left elbow injection all performed by Dr. Benard Rinkurnes at [facility]. Her neck pain is not much better. Her left lower leg pain is about 50% better and her left elbow pain is about 30% better. In regards to her neck pain she is still complaining of pain primarily on the right side of the neck. Occasional pain on the left as well but she denies radiating pain into her arms. She denies numbness or tingling into her arms. No weakness. Her elbow pain is diffuse. She has not noticed any swelling. Her pain is intermittent. Left lower leg pain is improving. Again she does not notice any weakness in her leg.    Review of Systems    as above Objective:   Physical Exam  Well-developed, well-nourished. No acute distress. Awake alert and oriented 3. Vital signs reviewed  Cervical spine: Patient still has limited cervical rotation especially to the right. No tenderness to palpation. Good strength in both upper extremities. No atrophy.  Left elbow: Full range of motion. No effusion. No soft tissue swelling. Minimal tenderness over the lateral epicondyle but she does not have significant pain with resisted ECRB testing or resisted wrist extension.  Lumbar spine: Good range of motion. She has regained 5/5 strength with resisted great toe extension on the left (this was 4/5 during her last office visit). Remainder of strength in both lower extremities is equal as are reflexes. Sensation is intact to light touch grossly.      Assessment & Plan:   Chronic neck pain likely secondary to facet arthropathy Left elbow pain possibly secondary to cervical radiculopathy Improving left lower leg pain secondary to  lumbar radiculopathy  We will schedule a follow-up visit with Dr.Curnes. He will likely proceed with either a right-sided facet injection aimed at the C4-C5 level on the right or he may do a diagnostic cervical ESI. The patient's left elbow pain may be due to an atypical presentation of radiculopathy since I cannot reproduce pain on today's exam. She has not noticed much benefit from a recent cortisone injection either. In regards to her left lower leg radiculopathy, she is making definite improvement. We will schedule her for a second lumbar ESI. Patient will follow-up with me in the office after these procedures. I will likely do a left elbow ultrasound at that visit.

## 2016-03-29 ENCOUNTER — Other Ambulatory Visit: Payer: Self-pay | Admitting: Sports Medicine

## 2016-03-29 DIAGNOSIS — M545 Low back pain: Secondary | ICD-10-CM

## 2016-03-29 DIAGNOSIS — M542 Cervicalgia: Secondary | ICD-10-CM

## 2016-03-29 DIAGNOSIS — G8929 Other chronic pain: Secondary | ICD-10-CM

## 2016-04-03 DIAGNOSIS — D509 Iron deficiency anemia, unspecified: Secondary | ICD-10-CM | POA: Diagnosis not present

## 2016-04-05 ENCOUNTER — Telehealth: Payer: Self-pay | Admitting: Family

## 2016-04-05 NOTE — Telephone Encounter (Signed)
atient Name: Jodi Anderson  DOB: 06/13/1960    Initial Comment caller states she feels like she has shocks in the middle of her chest   Nurse Assessment  Nurse: Stefano GaulStringer, RN, Dwana CurdVera Date/Time (Eastern Time): 04/05/2016 12:09:51 PM  Confirm and document reason for call. If symptomatic, describe symptoms. ---Caller states she has twinges in the middle of chest that only lasts a second and has them periodically through the day. Has had symptoms 5 - 7 days.  Does the patient have any new or worsening symptoms? ---Yes  Will a triage be completed? ---Yes  Related visit to physician within the last 2 weeks? ---No  Does the PT have any chronic conditions? (i.e. diabetes, asthma, etc.) ---No  Is this a behavioral health or substance abuse call? ---No     Guidelines    Guideline Title Affirmed Question Affirmed Notes  Chest Pain [1] Chest pain(s) lasting a few seconds AND [2] persists > 3 days    Final Disposition User   See PCP When Office is Open (within 3 days) Stefano GaulStringer, RN, Dwana CurdVera    Comments  appt scheduled for 04/06/2016 with Dr. Everlene OtherJayce Cook at 10:30 am.   Referrals  REFERRED TO PCP OFFICE   Disagree/Comply: Comply

## 2016-04-05 NOTE — Telephone Encounter (Signed)
Symptoms quite vague  Would you triage to ensure no CP, SOB, palpiations , arm pain?  If yes, she needs ED

## 2016-04-05 NOTE — Telephone Encounter (Signed)
Pt called and stated that she has been having electrical shock in the middle of chest times 5 days. Sent call to [facility] triage.   Call pt @ [phone]

## 2016-04-05 NOTE — Telephone Encounter (Signed)
Patient scheduled  By [facility] with Dr. Adriana Simasook for 04/06/16 @ 10:30 are you ok with this appointment for patient waiting 24 hours?

## 2016-04-05 NOTE — Telephone Encounter (Signed)
Spoke with patient she denies neck pain, no history of heart disease in family.   She has been having shortness of breath with going up stairs this has been going on for years,   She denies neck pain, has been having arm numbness in left arm but has been getting  cortisone injections .   While speaking to patient she was exercising walking .  Patient states she just had a "shock " in her chest before I contacted her. She denied chest pain , states she has chronic neck pain.   I explained to her that anytime you were dealing with chest pain you needed to address  it , and take it serious and have it worked up.   Encouraged patient to to go to ED or urgent care for evaluation.

## 2016-04-05 NOTE — Telephone Encounter (Signed)
See [facility] note. 

## 2016-04-06 ENCOUNTER — Encounter: Payer: Self-pay | Admitting: Family Medicine

## 2016-04-06 ENCOUNTER — Ambulatory Visit (INDEPENDENT_AMBULATORY_CARE_PROVIDER_SITE_OTHER): Payer: Commercial Managed Care - HMO | Admitting: Family Medicine

## 2016-04-06 VITALS — BP 116/70 | HR 65 | Temp 98.9°F | Wt 122.5 lb

## 2016-04-06 DIAGNOSIS — I4949 Other premature depolarization: Secondary | ICD-10-CM | POA: Diagnosis not present

## 2016-04-06 NOTE — Progress Notes (Signed)
Pre visit review using our clinic review tool, if applicable. No additional management support is needed unless otherwise documented below in the visit note. 

## 2016-04-06 NOTE — Assessment & Plan Note (Addendum)
New problem. Based on her history I suspect that she is having PACs and/or PVCs. She has no cardiac history or family history of cardiac disease. Her only risk factor is mild hyperlipidemia. Her history is not suggestive of an underlying ischemic etiology. EKG normal. We discussed watchful waiting versus proceeding in with echo and Holter monitor. Patient elected for watchful waiting at this time.

## 2016-04-06 NOTE — Patient Instructions (Signed)
Let me know if it persists.  Take care  Dr. Latricia Cerrito  

## 2016-04-06 NOTE — Progress Notes (Signed)
   Subjective:  Patient ID: Jodi Anderson, female    DOB: 12/26/1960  Age: 56 y.o. MRN: [id number]  CC: Twinge/fluttering in chest  HPI:  56 year old female with prior history of palpitations presents with the above complaint. She has no cardiac history or family history of cardiac disease.  Patient states that for the past 5-6 days she's had a "twinge" in the middle of her chest in between the breasts. She describes it as a twinge or a pulse. No reports of chest pain. No shortness of breath. No reports of palpitations. No known inciting factor. No exacerbating or relieving factors. No other associated symptoms. No other complaints or concerns at this time.  Social Hx   Social History   Social History  . Marital status: Married    Spouse name: N/A  . Number of children: N/A  . Years of education: N/A   Social History Main Topics  . Smoking status: Never Smoker  . Smokeless tobacco: Never Used  . Alcohol use Yes  . Drug use: No  . Sexual activity: Not Asked   Other Topics Concern  . None   Social History Narrative   [place] in [place]. 4 children, 1 son at home has epilepsy, son in 10th grade. Has dog and 3 cats.      Work - homemaker      Diet - regular diet      Exercise - walks daily, weights, trampoline    Review of Systems  Constitutional: Negative.   Respiratory: Negative for shortness of breath.   Cardiovascular: Negative for chest pain and palpitations.   Objective:  BP 116/70   Pulse 65   Temp 98.9 F (37.2 C) (Oral)   Wt 122 lb 8 oz (55.6 kg)   SpO2 98%   BMI 22.41 kg/m   BP/Weight 04/06/2016 03/27/2016 03/12/2016  Systolic BP 116 104 108  Diastolic BP 70 60 71  Wt. (Lbs) 122.5 125 -  BMI 22.41 22.86 -   Physical Exam  Constitutional: She is oriented to person, place, and time. She appears well-developed. No distress.  Cardiovascular: Normal rate and regular rhythm.   Pulmonary/Chest: Effort normal and breath sounds normal. She has no wheezes.  She has no rales.  Neurological: She is alert and oriented to person, place, and time.  Psychiatric: She has a normal mood and affect.  Vitals reviewed.   EKG - normal sinus rhythm at the rate of 62. Normal intervals. No ST or T-wave changes. Normal EKG.  Assessment & Plan:   Problem List Items Addressed This Visit    Premature beats - Primary    New problem. Based on her history I suspect that she is having PACs and/or PVCs. She has no cardiac history or family history of cardiac disease. Her only risk factor is mild hyperlipidemia. Her history is not suggestive of an underlying ischemic etiology. EKG normal. We discussed watchful waiting versus proceeding in with echo and Holter monitor. Patient elected for watchful waiting at this time.      Relevant Orders   EKG 12-Lead (Completed)   EKG 12-Lead (Completed)     Follow-up: PRN  Everlene OtherJayce Verbon Giangregorio DO [facility] [place]

## 2016-04-19 ENCOUNTER — Telehealth: Payer: Self-pay | Admitting: *Deleted

## 2016-04-19 ENCOUNTER — Other Ambulatory Visit: Payer: Self-pay | Admitting: Family

## 2016-04-19 DIAGNOSIS — G4700 Insomnia, unspecified: Secondary | ICD-10-CM

## 2016-04-19 MED ORDER — ZOLPIDEM TARTRATE 10 MG PO TABS: ORAL_TABLET | ORAL | 1 refills | Status: DC

## 2016-04-19 NOTE — Telephone Encounter (Signed)
Left message for patient to return call back.  

## 2016-04-19 NOTE — Telephone Encounter (Signed)
Call pt   Let her know

## 2016-04-19 NOTE — Telephone Encounter (Signed)
Refill request for, last seen [id number].  Please advise.

## 2016-04-19 NOTE — Telephone Encounter (Signed)
Requested medication refill for :Zolpidem Pharmacy:[facility]  Please Contact Pt when ready or sent to Pharmacy:  [phone]

## 2016-04-24 NOTE — Telephone Encounter (Signed)
Left message for patient to return call back.  

## 2016-04-26 NOTE — Telephone Encounter (Signed)
Closing note due to patient has already picked up medication from pharmacy.

## 2016-05-03 ENCOUNTER — Other Ambulatory Visit: Payer: Self-pay | Admitting: Family

## 2016-05-03 DIAGNOSIS — Z760 Encounter for issue of repeat prescription: Secondary | ICD-10-CM

## 2016-05-07 ENCOUNTER — Telehealth: Payer: Self-pay | Admitting: *Deleted

## 2016-05-07 NOTE — Telephone Encounter (Signed)
[facility] pharmacy requested a call in reference to a medication  Contact [phone]

## 2016-05-08 ENCOUNTER — Other Ambulatory Visit: Payer: Self-pay | Admitting: Internal Medicine

## 2016-05-08 ENCOUNTER — Telehealth: Payer: Self-pay

## 2016-05-08 DIAGNOSIS — Z760 Encounter for issue of repeat prescription: Secondary | ICD-10-CM

## 2016-05-08 MED ORDER — DICLOFENAC-MISOPROSTOL 75-0.2 MG PO TBEC: DELAYED_RELEASE_TABLET | ORAL | 1 refills | Status: DC

## 2016-05-08 NOTE — Telephone Encounter (Signed)
Refilled: 02/02/16 Last OV: 01/02/2016 Next OV: not scheduled.   Pharmacy is requesting the rx be written for #60 because of the pt's copay.

## 2016-05-08 NOTE — Telephone Encounter (Signed)
Pharmacist was notified that patient provider has DC one of her medications.

## 2016-05-08 NOTE — Telephone Encounter (Signed)
What medication

## 2016-05-09 ENCOUNTER — Other Ambulatory Visit: Payer: Commercial Managed Care - HMO

## 2016-05-09 NOTE — Telephone Encounter (Signed)
Gave Dr. Darrick Huntsman the hard copy of the rx last night and she fixed it.

## 2016-05-10 ENCOUNTER — Encounter: Payer: Self-pay | Admitting: Radiology

## 2016-05-10 ENCOUNTER — Ambulatory Visit
Admission: RE | Admit: 2016-05-10 | Discharge: 2016-05-10 | Disposition: A | Discharge status: Home / Self Care | Payer: Commercial Managed Care - HMO | Source: Ambulatory Visit | Attending: Sports Medicine | Admitting: Sports Medicine

## 2016-05-10 DIAGNOSIS — M545 Low back pain: Principal | ICD-10-CM

## 2016-05-10 DIAGNOSIS — M47817 Spondylosis without myelopathy or radiculopathy, lumbosacral region: Secondary | ICD-10-CM | POA: Diagnosis not present

## 2016-05-10 DIAGNOSIS — G8929 Other chronic pain: Secondary | ICD-10-CM

## 2016-05-10 MED ORDER — METHYLPREDNISOLONE ACETATE 40 MG/ML INJ SUSP (RADIOLOG
120.0000 mg | Freq: Once | INTRAMUSCULAR | Status: AC
  Administered 2016-05-10: 120 mg via EPIDURAL

## 2016-05-10 MED ORDER — IOPAMIDOL (ISOVUE-M 200) INJECTION 41%
1.0000 mL | Freq: Once | INTRAMUSCULAR | Status: AC
  Administered 2016-05-10: 1 mL via EPIDURAL

## 2016-05-10 NOTE — Discharge Instructions (Signed)

## 2016-05-24 ENCOUNTER — Ambulatory Visit
Admission: RE | Admit: 2016-05-24 | Discharge: 2016-05-24 | Disposition: A | Discharge status: Home / Self Care | Payer: Commercial Managed Care - HMO | Source: Ambulatory Visit | Attending: Sports Medicine | Admitting: Sports Medicine

## 2016-05-24 ENCOUNTER — Other Ambulatory Visit: Payer: Self-pay | Admitting: Sports Medicine

## 2016-05-24 DIAGNOSIS — M542 Cervicalgia: Secondary | ICD-10-CM

## 2016-05-24 DIAGNOSIS — G8929 Other chronic pain: Secondary | ICD-10-CM

## 2016-05-24 DIAGNOSIS — M5442 Lumbago with sciatica, left side: Principal | ICD-10-CM

## 2016-05-24 DIAGNOSIS — M47817 Spondylosis without myelopathy or radiculopathy, lumbosacral region: Secondary | ICD-10-CM | POA: Diagnosis not present

## 2016-05-24 MED ORDER — TRIAMCINOLONE ACETONIDE 40 MG/ML IJ SUSP (RADIOLOGY): 60.0000 mg | Freq: Once | INTRAMUSCULAR | Status: DC

## 2016-05-24 MED ORDER — IOPAMIDOL (ISOVUE-M 300) INJECTION 61%
1.0000 mL | Freq: Once | INTRAMUSCULAR | Status: AC | PRN
  Administered 2016-05-24: 1 mL via EPIDURAL

## 2016-05-24 MED ORDER — METHYLPREDNISOLONE ACETATE 40 MG/ML INJ SUSP (RADIOLOG
120.0000 mg | Freq: Once | INTRAMUSCULAR | Status: AC
  Administered 2016-05-24: 120 mg via EPIDURAL

## 2016-05-24 NOTE — Discharge Instructions (Signed)

## 2016-06-20 ENCOUNTER — Other Ambulatory Visit: Payer: Self-pay | Admitting: Family

## 2016-06-20 DIAGNOSIS — G4700 Insomnia, unspecified: Secondary | ICD-10-CM

## 2016-06-26 ENCOUNTER — Telehealth: Payer: Self-pay

## 2016-06-26 DIAGNOSIS — G4700 Insomnia, unspecified: Secondary | ICD-10-CM

## 2016-06-26 NOTE — Telephone Encounter (Signed)
Patient requested a update on this refill Pt contact [phone]

## 2016-06-26 NOTE — Telephone Encounter (Signed)
Refill request for Jodi Anderson, last seen 01/02/2016, last filled 04/18/2016.  Please advise.

## 2016-06-27 MED ORDER — ZOLPIDEM TARTRATE 10 MG PO TABS: ORAL_TABLET | ORAL | 1 refills | Status: DC

## 2016-06-27 NOTE — Telephone Encounter (Signed)
Refilled

## 2016-06-27 NOTE — Addendum Note (Signed)
Addended by: Allegra GranaARNETT, Ilissa Rosner G on: 06/27/2016 02:38 PM   Modules accepted: Orders

## 2016-07-30 ENCOUNTER — Other Ambulatory Visit: Payer: Self-pay | Admitting: *Deleted

## 2016-07-30 ENCOUNTER — Telehealth: Payer: Self-pay | Admitting: Sports Medicine

## 2016-07-30 DIAGNOSIS — M545 Low back pain: Secondary | ICD-10-CM

## 2016-07-30 NOTE — Telephone Encounter (Signed)
Patient called requesting an order be placed to [facility] for a second round of injections for her neck

## 2016-07-31 ENCOUNTER — Other Ambulatory Visit: Payer: Self-pay | Admitting: Sports Medicine

## 2016-07-31 DIAGNOSIS — M545 Low back pain: Principal | ICD-10-CM

## 2016-07-31 DIAGNOSIS — G8929 Other chronic pain: Secondary | ICD-10-CM

## 2016-08-14 ENCOUNTER — Other Ambulatory Visit: Payer: Self-pay

## 2016-08-14 DIAGNOSIS — M542 Cervicalgia: Secondary | ICD-10-CM

## 2016-08-14 MED ORDER — DIAZEPAM 10 MG PO TABS: ORAL_TABLET | ORAL | 0 refills | Status: DC

## 2016-08-15 ENCOUNTER — Other Ambulatory Visit: Payer: Self-pay | Admitting: Sports Medicine

## 2016-08-15 DIAGNOSIS — M542 Cervicalgia: Secondary | ICD-10-CM

## 2016-09-04 ENCOUNTER — Other Ambulatory Visit: Payer: Self-pay | Admitting: Family

## 2016-09-04 DIAGNOSIS — G4700 Insomnia, unspecified: Secondary | ICD-10-CM

## 2016-09-04 NOTE — Telephone Encounter (Signed)
Last OV was in March, last refill on June, please advise, thanks

## 2016-09-10 ENCOUNTER — Ambulatory Visit
Admission: RE | Admit: 2016-09-10 | Discharge: 2016-09-10 | Disposition: A | Discharge status: Home / Self Care | Payer: 59 | Source: Ambulatory Visit | Attending: Sports Medicine | Admitting: Sports Medicine

## 2016-09-10 ENCOUNTER — Other Ambulatory Visit: Payer: Self-pay

## 2016-09-10 ENCOUNTER — Other Ambulatory Visit: Payer: Self-pay | Admitting: Family

## 2016-09-10 ENCOUNTER — Other Ambulatory Visit: Payer: Self-pay | Admitting: Sports Medicine

## 2016-09-10 DIAGNOSIS — M47812 Spondylosis without myelopathy or radiculopathy, cervical region: Secondary | ICD-10-CM

## 2016-09-10 DIAGNOSIS — M542 Cervicalgia: Secondary | ICD-10-CM

## 2016-09-10 DIAGNOSIS — G4700 Insomnia, unspecified: Secondary | ICD-10-CM

## 2016-09-10 MED ORDER — IOPAMIDOL (ISOVUE-M 300) INJECTION 61%: 1.0000 mL | Freq: Once | INTRAMUSCULAR | Status: DC | PRN

## 2016-09-10 MED ORDER — DEXAMETHASONE SODIUM PHOSPHATE 4 MG/ML IJ SOLN
5.0000 mg | Freq: Once | INTRAMUSCULAR | Status: AC
  Administered 2016-09-10: 5.2 mg via INTRA_ARTICULAR

## 2016-09-10 MED ORDER — TRIAMCINOLONE ACETONIDE 40 MG/ML IJ SUSP (RADIOLOGY): 60.0000 mg | Freq: Once | INTRAMUSCULAR | Status: DC

## 2016-09-10 NOTE — Telephone Encounter (Signed)
Call pt  refillled Jodi Anderson  I haven't seen her in a while- she needs to be seen for any future refills  I looked up patient on [place] Controlled Substances Reporting System and saw no activity that raised concern of inappropriate use.

## 2016-09-10 NOTE — Telephone Encounter (Signed)
Called patient l/m will need app. Script faxed with note to call for app as well.

## 2016-09-10 NOTE — Discharge Instructions (Signed)

## 2016-09-10 NOTE — Telephone Encounter (Signed)
Please advise for refill, last OV was 01/02/16, no upcoming appt. thanks

## 2016-09-10 NOTE — Telephone Encounter (Signed)
Last OV 01/02/2016 Next OV not scheduled  Last refill

## 2016-09-11 NOTE — Telephone Encounter (Signed)
Call pt  Please clarify- I refilled in June , she picked up in July, and there is still one refill.   She doesn't use bu 1/3 of tab at night for sleep so is she out??   I haven't seen her since 12/2015. She needs an OV for any future refills as controlled substance

## 2016-10-01 ENCOUNTER — Ambulatory Visit (INDEPENDENT_AMBULATORY_CARE_PROVIDER_SITE_OTHER): Payer: 59 | Admitting: Sports Medicine

## 2016-10-01 VITALS — BP 90/60 | Ht 62.0 in | Wt 120.0 lb

## 2016-10-01 DIAGNOSIS — M5030 Other cervical disc degeneration, unspecified cervical region: Secondary | ICD-10-CM | POA: Diagnosis not present

## 2016-10-01 MED ORDER — DIAZEPAM 10 MG PO TABS: ORAL_TABLET | ORAL | 0 refills | Status: DC

## 2016-10-01 NOTE — Progress Notes (Signed)
  Patient comes in today with questions about her cervical spine. Her most recent cervical spine injection was a facet injection in August. It has been helpful but she would like a repeat injection. She has also noticed a prominence of her C7 vertebrae which has her concerned. I've reassured her that this is normal and is likely more prominent due to some early kyphosis.  Physical exam was not repeated today. We simply discussed her cervical spine MRI. She understands that she has degenerative disc disease and facet arthropathy. I think she can proceed with a repeat injection since they have helped her before. I discussed the fact that she will need to live with some discomfort unless she finds her pain unbearable. At that point, she would need to consider surgical decompression. She is certainly not at that point. She takes Arthrotec and Robaxin intermittently which are helpful. I think she can continue on these as needed.  I will order a repeat cervical spine facet injection. Her previous injections were done by Dr. Benard Rinkurnes so we will request him again. Follow-up with me as needed.  Total time spent with the patient was 15 minutes with greater than 50% of the time spent in face-to-face consultation discussing her MRI findings and treatment going forward.

## 2016-11-06 ENCOUNTER — Ambulatory Visit
Admission: RE | Admit: 2016-11-06 | Discharge: 2016-11-06 | Disposition: A | Discharge status: Home / Self Care | Payer: 59 | Source: Ambulatory Visit | Attending: Sports Medicine | Admitting: Sports Medicine

## 2016-11-06 DIAGNOSIS — M545 Low back pain: Principal | ICD-10-CM

## 2016-11-06 DIAGNOSIS — G8929 Other chronic pain: Secondary | ICD-10-CM

## 2016-11-06 MED ORDER — IOPAMIDOL (ISOVUE-M 200) INJECTION 41%
1.0000 mL | Freq: Once | INTRAMUSCULAR | Status: AC
  Administered 2016-11-06: 1 mL via EPIDURAL

## 2016-11-06 MED ORDER — METHYLPREDNISOLONE ACETATE 40 MG/ML INJ SUSP (RADIOLOG
120.0000 mg | Freq: Once | INTRAMUSCULAR | Status: AC
  Administered 2016-11-06: 120 mg via EPIDURAL

## 2016-11-06 NOTE — Discharge Instructions (Signed)

## 2016-11-14 ENCOUNTER — Telehealth: Payer: Self-pay | Admitting: Family

## 2016-11-14 DIAGNOSIS — Z1239 Encounter for other screening for malignant neoplasm of breast: Secondary | ICD-10-CM

## 2016-11-14 NOTE — Telephone Encounter (Signed)
Call pt  reviewing chart  Pt is due for  Mammogram  I have ordered; please ensure she schedules   

## 2016-11-14 NOTE — Telephone Encounter (Signed)
Patient has been informed.

## 2016-11-26 ENCOUNTER — Encounter: Payer: Self-pay | Admitting: Family

## 2016-11-27 ENCOUNTER — Telehealth: Payer: Self-pay | Admitting: Family

## 2016-11-27 DIAGNOSIS — G4700 Insomnia, unspecified: Secondary | ICD-10-CM

## 2016-11-27 NOTE — Telephone Encounter (Signed)
Please advise 

## 2016-11-27 NOTE — Telephone Encounter (Signed)
Please see pt. [facility] CMA re: Ambien and need for appt.

## 2016-11-27 NOTE — Telephone Encounter (Signed)
Copied from CRM [phone]. Topic: Quick Communication - See Telephone Encounter >> Nov 27, 2016  4:51 PM Terisa Starraylor, Brittany L wrote: CRM for notification. See Telephone encounter for:   Needs her AMBIEN refilled. Pharmacy said the office is saying she needed to make an appt. She wants to know if it could be called in over phone without the appt. She wants BROCK to call her back.  11/27/16.

## 2016-11-28 ENCOUNTER — Telehealth: Payer: Self-pay | Admitting: Family

## 2016-11-28 ENCOUNTER — Other Ambulatory Visit: Payer: Self-pay | Admitting: Family

## 2016-11-28 DIAGNOSIS — G4700 Insomnia, unspecified: Secondary | ICD-10-CM

## 2016-11-28 MED ORDER — ZOLPIDEM TARTRATE 10 MG PO TABS: ORAL_TABLET | ORAL | 0 refills | Status: DC

## 2016-11-28 NOTE — Telephone Encounter (Signed)
Patient has already been informed.

## 2016-11-28 NOTE — Telephone Encounter (Signed)
Spoke to patient. She was informed shge will need appointment before receiving further refills after today.

## 2016-11-28 NOTE — Telephone Encounter (Signed)
Call patient Jodi Anderson-she had not been seen since December last year.   Please education patient. This is controlled substance;  In order for me to prescribe medication,  Patient must be seen every 3-6 months. please make follow-up appointment.

## 2016-12-05 ENCOUNTER — Telehealth: Payer: Self-pay | Admitting: Family

## 2016-12-10 ENCOUNTER — Telehealth: Payer: Self-pay

## 2016-12-10 ENCOUNTER — Ambulatory Visit
Admission: RE | Admit: 2016-12-10 | Discharge: 2016-12-10 | Disposition: A | Discharge status: Home / Self Care | Payer: 59 | Source: Ambulatory Visit | Attending: Family | Admitting: Family

## 2016-12-10 DIAGNOSIS — Z1231 Encounter for screening mammogram for malignant neoplasm of breast: Secondary | ICD-10-CM | POA: Insufficient documentation

## 2016-12-10 DIAGNOSIS — Z1239 Encounter for other screening for malignant neoplasm of breast: Secondary | ICD-10-CM

## 2016-12-10 NOTE — Telephone Encounter (Signed)
Copied from CRM [phone]. Topic: Inquiry >> Dec 10, 2016  1:22 PM Everardo [place], [place], [place] wrote: Reason for CRM: Patient callled because her mother is a patient of Dr.Tullo and she wanted to know if she could get an appointment with her as well. If someone could give her a call back about this please at [phone]

## 2016-12-10 NOTE — Telephone Encounter (Signed)
Please advise 

## 2016-12-11 NOTE — Telephone Encounter (Signed)
LMTCB to let pt know that Dr. Darrick Huntsmanullo will not be able to take her on as a new patient at this time.

## 2016-12-11 NOTE — Telephone Encounter (Signed)
No, I cannot accept her as a new patient at this time,  I am sorry

## 2016-12-12 ENCOUNTER — Telehealth: Payer: Self-pay

## 2016-12-12 NOTE — Telephone Encounter (Signed)
LMTCB

## 2016-12-12 NOTE — Telephone Encounter (Signed)
Copied from CRM [phone]. Topic: Appointment Scheduling - New Patient >> Dec 12, 2016 12:29 PM Jodi Anderson, Jodi Anderson wrote: CRM for notification. See Telephone encounter for: 12/12/16. Patient called and would like to talk to Dr. Darrick Huntsmanullo or CMA about if she would take her as a new patient. Patients mom is [name]. Please call patient back, thanks.

## 2016-12-12 NOTE — Telephone Encounter (Signed)
Duplicate message. 

## 2016-12-18 NOTE — Telephone Encounter (Signed)
Spoke with Jodi Anderson to inform her that you were not accepting any new pts at this time. The Jodi Anderson asked if you would be willing to see her for a CPE with pap smear. She stated that she is due for one before [name] gets back in February. Please advise.

## 2016-12-18 NOTE — Telephone Encounter (Signed)
Yes as long as I have an available slot

## 2016-12-18 NOTE — Telephone Encounter (Signed)
Please cal

## 2016-12-20 NOTE — Telephone Encounter (Signed)
Pt has been scheduled for a CPE in January. Pt is aware of appt date and time.

## 2017-01-09 ENCOUNTER — Other Ambulatory Visit: Payer: Self-pay | Admitting: Family

## 2017-01-09 DIAGNOSIS — G4700 Insomnia, unspecified: Secondary | ICD-10-CM

## 2017-02-06 ENCOUNTER — Encounter: Payer: 59 | Admitting: Internal Medicine

## 2017-03-13 DIAGNOSIS — H2513 Age-related nuclear cataract, bilateral: Secondary | ICD-10-CM | POA: Diagnosis not present

## 2017-03-14 ENCOUNTER — Other Ambulatory Visit (HOSPITAL_COMMUNITY)
Admission: RE | Admit: 2017-03-14 | Discharge: 2017-03-14 | Disposition: A | Discharge status: Home / Self Care | Payer: 59 | Source: Ambulatory Visit | Attending: Internal Medicine | Admitting: Internal Medicine

## 2017-03-14 ENCOUNTER — Encounter: Payer: Self-pay | Admitting: Internal Medicine

## 2017-03-14 ENCOUNTER — Ambulatory Visit (INDEPENDENT_AMBULATORY_CARE_PROVIDER_SITE_OTHER): Payer: 59 | Admitting: Internal Medicine

## 2017-03-14 ENCOUNTER — Telehealth: Payer: Self-pay | Admitting: Family

## 2017-03-14 VITALS — BP 102/64 | HR 80 | Temp 98.2°F | Resp 15 | Ht 62.0 in | Wt 123.8 lb

## 2017-03-14 DIAGNOSIS — Z0000 Encounter for general adult medical examination without abnormal findings: Secondary | ICD-10-CM | POA: Diagnosis not present

## 2017-03-14 DIAGNOSIS — Z13220 Encounter for screening for lipoid disorders: Secondary | ICD-10-CM | POA: Diagnosis not present

## 2017-03-14 DIAGNOSIS — G4700 Insomnia, unspecified: Secondary | ICD-10-CM | POA: Diagnosis not present

## 2017-03-14 DIAGNOSIS — Z23 Encounter for immunization: Secondary | ICD-10-CM | POA: Diagnosis not present

## 2017-03-14 DIAGNOSIS — E559 Vitamin D deficiency, unspecified: Secondary | ICD-10-CM | POA: Diagnosis not present

## 2017-03-14 DIAGNOSIS — Z124 Encounter for screening for malignant neoplasm of cervix: Secondary | ICD-10-CM

## 2017-03-14 DIAGNOSIS — E538 Deficiency of other specified B group vitamins: Secondary | ICD-10-CM

## 2017-03-14 DIAGNOSIS — Z114 Encounter for screening for human immunodeficiency virus [HIV]: Secondary | ICD-10-CM

## 2017-03-14 DIAGNOSIS — R5383 Other fatigue: Secondary | ICD-10-CM

## 2017-03-14 LAB — CBC WITH DIFFERENTIAL/PLATELET
Basophils Absolute: 0 10*3/uL (ref 0.0–0.1)
Basophils Relative: 1.2 % (ref 0.0–3.0)
Eosinophils Absolute: 0.1 10*3/uL (ref 0.0–0.7)
Eosinophils Relative: 2.2 % (ref 0.0–5.0)
HCT: 37.7 % (ref 36.0–46.0)
Hemoglobin: 12.8 g/dL (ref 12.0–15.0)
Lymphocytes Relative: 34.9 % (ref 12.0–46.0)
Lymphs Abs: 1.2 10*3/uL (ref 0.7–4.0)
MCHC: 34 g/dL (ref 30.0–36.0)
MCV: 90.1 fl (ref 78.0–100.0)
Monocytes Absolute: 0.5 10*3/uL (ref 0.1–1.0)
Monocytes Relative: 15.4 % — ABNORMAL HIGH (ref 3.0–12.0)
Neutro Abs: 1.5 10*3/uL (ref 1.4–7.7)
Neutrophils Relative %: 46.3 % (ref 43.0–77.0)
Platelets: 266 10*3/uL (ref 150.0–400.0)
RBC: 4.19 Mil/uL (ref 3.87–5.11)
RDW: 12.6 % (ref 11.5–15.5)
WBC: 3.3 10*3/uL — ABNORMAL LOW (ref 4.0–10.5)

## 2017-03-14 LAB — LIPID PANEL
Cholesterol: 205 mg/dL — ABNORMAL HIGH (ref 0–200)
HDL: 80.2 mg/dL (ref >39.00)
LDL Cholesterol: 111 mg/dL — ABNORMAL HIGH (ref 0–99)
NonHDL: 124.52
Total CHOL/HDL Ratio: 3
Triglycerides: 68 mg/dL (ref 0.0–149.0)
VLDL: 13.6 mg/dL (ref 0.0–40.0)

## 2017-03-14 LAB — VITAMIN D 25 HYDROXY (VIT D DEFICIENCY, FRACTURES): VITD: 26.85 ng/mL — ABNORMAL LOW (ref 30.00–100.00)

## 2017-03-14 LAB — VITAMIN B12: Vitamin B-12: 259 pg/mL (ref 211–911)

## 2017-03-14 LAB — TSH: TSH: 0.62 u[IU]/mL (ref 0.35–4.50)

## 2017-03-14 MED ORDER — SCOPOLAMINE 1 MG/3DAYS TD PT72: 1.0000 | MEDICATED_PATCH | TRANSDERMAL | 0 refills | Status: DC

## 2017-03-14 MED ORDER — ZOLPIDEM TARTRATE 10 MG PO TABS: ORAL_TABLET | ORAL | 5 refills | Status: DC

## 2017-03-14 MED ORDER — ONDANSETRON 4 MG PO TBDP: 4.0000 mg | ORAL_TABLET | Freq: Three times a day (TID) | ORAL | 0 refills | Status: DC | PRN

## 2017-03-14 MED ORDER — LEVOFLOXACIN 500 MG PO TABS: 500.0000 mg | ORAL_TABLET | Freq: Every day | ORAL | 0 refills | Status: DC

## 2017-03-14 NOTE — Telephone Encounter (Signed)
She will not be taking them together so there is no risk.  One is for nausea,  One is for sinus infection .

## 2017-03-14 NOTE — Telephone Encounter (Signed)
Called regarding possible interaction with zofran and levaquin.  Just needed to clarify why on and what needing for, etc.

## 2017-03-14 NOTE — Patient Instructions (Signed)
Good to see you again!   You were vaccinated for tetanus and whooping cough today .  This is good for ten years    I have sent Zofran, Transdermal scopalamine patches and levaquin to your pharmacy to take with you on the trip  Do not start  the levaquin unless you develop a sinus infection,  UTI  Or bronchitis. If you take, it,  Please take a probiotic ( Align, Floraque or Culturelle) while you are on the antibiotic to prevent a serious antibiotic associated diarrhea  Called clostirudium dificile colitis and a vaginal yeast infection   ambien refilled    Health Maintenance for Postmenopausal Women Menopause is a normal process in which your reproductive ability comes to an end. This process happens gradually over a span of months to years, usually between the ages of 40 and 29. Menopause is complete when you have missed 12 consecutive menstrual periods. It is important to talk with your health care provider about some of the most common conditions that affect postmenopausal women, such as heart disease, cancer, and bone loss (osteoporosis). Adopting a healthy lifestyle and getting preventive care can help to promote your health and wellness. Those actions can also lower your chances of developing some of these common conditions. What should I know about menopause? During menopause, you may experience a number of symptoms, such as:  Moderate-to-severe hot flashes.  Night sweats.  Decrease in sex drive.  Mood swings.  Headaches.  Tiredness.  Irritability.  Memory problems.  Insomnia.  Choosing to treat or not to treat menopausal changes is an individual decision that you make with your health care provider. What should I know about hormone replacement therapy and supplements? Hormone therapy products are effective for treating symptoms that are associated with menopause, such as hot flashes and night sweats. Hormone replacement carries certain risks, especially as you become  older. If you are thinking about using estrogen or estrogen with progestin treatments, discuss the benefits and risks with your health care provider. What should I know about heart disease and stroke? Heart disease, heart attack, and stroke become more likely as you age. This may be due, in part, to the hormonal changes that your body experiences during menopause. These can affect how your body processes dietary fats, triglycerides, and cholesterol. Heart attack and stroke are both medical emergencies. There are many things that you can do to help prevent heart disease and stroke:  Have your blood pressure checked at least every 1-2 years. High blood pressure causes heart disease and increases the risk of stroke.  If you are 70-79 years old, ask your health care provider if you should take aspirin to prevent a heart attack or a stroke.  Do not use any tobacco products, including cigarettes, chewing tobacco, or electronic cigarettes. If you need help quitting, ask your health care provider.  It is important to eat a healthy diet and maintain a healthy weight. ? Be sure to include plenty of vegetables, fruits, low-fat dairy products, and lean protein. ? Avoid eating foods that are high in solid fats, added sugars, or salt (sodium).  Get regular exercise. This is one of the most important things that you can do for your health. ? Try to exercise for at least 150 minutes each week. The type of exercise that you do should increase your heart rate and make you sweat. This is known as moderate-intensity exercise. ? Try to do strengthening exercises at least twice each week. Do these in addition  to the moderate-intensity exercise.  Know your numbers.Ask your health care provider to check your cholesterol and your blood glucose. Continue to have your blood tested as directed by your health care provider.  What should I know about cancer screening? There are several types of cancer. Take the following  steps to reduce your risk and to catch any cancer development as early as possible. Breast Cancer  Practice breast self-awareness. ? This means understanding how your breasts normally appear and feel. ? It also means doing regular breast self-exams. Let your health care provider know about any changes, no matter how small.  If you are 74 or older, have a clinician do a breast exam (clinical breast exam or CBE) every year. Depending on your age, family history, and medical history, it may be recommended that you also have a yearly breast X-ray (mammogram).  If you have a family history of breast cancer, talk with your health care provider about genetic screening.  If you are at high risk for breast cancer, talk with your health care provider about having an MRI and a mammogram every year.  Breast cancer (BRCA) gene test is recommended for women who have family members with BRCA-related cancers. Results of the assessment will determine the need for genetic counseling and BRCA1 and for BRCA2 testing. BRCA-related cancers include these types: ? Breast. This occurs in males or females. ? Ovarian. ? Tubal. This may also be called fallopian tube cancer. ? Cancer of the abdominal or pelvic lining (peritoneal cancer). ? Prostate. ? Pancreatic.  Cervical, Uterine, and Ovarian Cancer Your health care provider may recommend that you be screened regularly for cancer of the pelvic organs. These include your ovaries, uterus, and vagina. This screening involves a pelvic exam, which includes checking for microscopic changes to the surface of your cervix (Pap test).  For women ages 21-65, health care providers may recommend a pelvic exam and a Pap test every three years. For women ages 72-65, they may recommend the Pap test and pelvic exam, combined with testing for human papilloma virus (HPV), every five years. Some types of HPV increase your risk of cervical cancer. Testing for HPV may also be done on women  of any age who have unclear Pap test results.  Other health care providers may not recommend any screening for nonpregnant women who are considered low risk for pelvic cancer and have no symptoms. Ask your health care provider if a screening pelvic exam is right for you.  If you have had past treatment for cervical cancer or a condition that could lead to cancer, you need Pap tests and screening for cancer for at least 20 years after your treatment. If Pap tests have been discontinued for you, your risk factors (such as having a new sexual partner) need to be reassessed to determine if you should start having screenings again. Some women have medical problems that increase the chance of getting cervical cancer. In these cases, your health care provider may recommend that you have screening and Pap tests more often.  If you have a family history of uterine cancer or ovarian cancer, talk with your health care provider about genetic screening.  If you have vaginal bleeding after reaching menopause, tell your health care provider.  There are currently no reliable tests available to screen for ovarian cancer.  Lung Cancer Lung cancer screening is recommended for adults 89-23 years old who are at high risk for lung cancer because of a history of smoking. A yearly  low-dose CT scan of the lungs is recommended if you:  Currently smoke.  Have a history of at least 30 pack-years of smoking and you currently smoke or have quit within the past 15 years. A pack-year is smoking an average of one pack of cigarettes per day for one year.  Yearly screening should:  Continue until it has been 15 years since you quit.  Stop if you develop a health problem that would prevent you from having lung cancer treatment.  Colorectal Cancer  This type of cancer can be detected and can often be prevented.  Routine colorectal cancer screening usually begins at age 68 and continues through age 69.  If you have risk  factors for colon cancer, your health care provider may recommend that you be screened at an earlier age.  If you have a family history of colorectal cancer, talk with your health care provider about genetic screening.  Your health care provider may also recommend using home test kits to check for hidden blood in your stool.  A small camera at the end of a tube can be used to examine your colon directly (sigmoidoscopy or colonoscopy). This is done to check for the earliest forms of colorectal cancer.  Direct examination of the colon should be repeated every 5-10 years until age 16. However, if early forms of precancerous polyps or small growths are found or if you have a family history or genetic risk for colorectal cancer, you may need to be screened more often.  Skin Cancer  Check your skin from head to toe regularly.  Monitor any moles. Be sure to tell your health care provider: ? About any new moles or changes in moles, especially if there is a change in a mole's shape or color. ? If you have a mole that is larger than the size of a pencil eraser.  If any of your family members has a history of skin cancer, especially at a young age, talk with your health care provider about genetic screening.  Always use sunscreen. Apply sunscreen liberally and repeatedly throughout the day.  Whenever you are outside, protect yourself by wearing long sleeves, pants, a wide-brimmed hat, and sunglasses.  What should I know about osteoporosis? Osteoporosis is a condition in which bone destruction happens more quickly than new bone creation. After menopause, you may be at an increased risk for osteoporosis. To help prevent osteoporosis or the bone fractures that can happen because of osteoporosis, the following is recommended:  If you are 44-54 years old, get at least 1,000 mg of calcium and at least 600 mg of vitamin D per day.  If you are older than age 70 but younger than age 52, get at least 1,200  mg of calcium and at least 600 mg of vitamin D per day.  If you are older than age 68, get at least 1,200 mg of calcium and at least 800 mg of vitamin D per day.  Smoking and excessive alcohol intake increase the risk of osteoporosis. Eat foods that are rich in calcium and vitamin D, and do weight-bearing exercises several times each week as directed by your health care provider. What should I know about how menopause affects my mental health? Depression may occur at any age, but it is more common as you become older. Common symptoms of depression include:  Low or sad mood.  Changes in sleep patterns.  Changes in appetite or eating patterns.  Feeling an overall lack of motivation or enjoyment  of activities that you previously enjoyed.  Frequent crying spells.  Talk with your health care provider if you think that you are experiencing depression. What should I know about immunizations? It is important that you get and maintain your immunizations. These include:  Tetanus, diphtheria, and pertussis (Tdap) booster vaccine.  Influenza every year before the flu season begins.  Pneumonia vaccine.  Shingles vaccine.  Your health care provider may also recommend other immunizations. This information is not intended to replace advice given to you by your health care provider. Make sure you discuss any questions you have with your health care provider. Document Released: 03/02/2005 Document Revised: 07/29/2015 Document Reviewed: 10/12/2014 Elsevier Interactive Patient Education  2018 [employer].

## 2017-03-14 NOTE — Telephone Encounter (Signed)
Medication was sent in for pt to have to take on trip just incase she were to get sick. Spoke with pt and she stated that she doesn't have to pick the rxs up until tomorrow afternoon.

## 2017-03-14 NOTE — Progress Notes (Signed)
Subjective:  Patient ID: Jodi Anderson, female    DOB: 07/14/1960  Age: 57 y.o. MRN: [id number]  CC: The primary encounter diagnosis was B12 deficiency. Diagnoses of Cervical cancer screening, Screening for hyperlipidemia, Screening for HIV (human immunodeficiency virus), Fatigue, unspecified type, Vitamin D deficiency, Insomnia, unspecified type, Need for diphtheria-tetanus-pertussis (Tdap) vaccine, and Routine general medical examination at a health care facility were also pertinent to this visit.  HPI Jodi Clinchmily R Flores presents for transition of care from Dr Adriana Simasook.  Patient is due for annual CPE with PAP and arrived 2 minutes late Patient's mother is [name]   PAP smear due  Up to date on mammogram Nov 2018  Colonoscopy 2016 normal,  [facility]   B12 deficiency noted 2017.  Needs rechecking  Needs refill on ambien Fasting today   Overdue for tDap , HIV a    Outpatient Medications Prior to Visit  Medication Sig Dispense Refill  . ferrous sulfate 325 (65 FE) MG tablet Take by mouth.    . zolpidem (AMBIEN) 10 MG tablet TAKE 1 TABLET BY MOUTH AT BEDTIME AS NEEDED FOR INSOMNIA 14 tablet 0  . Diclofenac-Misoprostol 75-0.2 MG TBEC TAKE ONE TABLET TWICE A DAY AS NEEDED (Patient not taking: Reported on 03/14/2017) 60 tablet 1  . metaxalone (SKELAXIN) 800 MG tablet Take 1 tablet (800 mg total) by mouth 2 (two) times daily. (Patient not taking: Reported on 03/14/2017) 60 tablet 1  . valACYclovir (VALTREX) 1000 MG tablet Take 1 tablet (1,000 mg total) by mouth 3 (three) times daily. (Patient not taking: Reported on 03/14/2017) 21 tablet 0  . diazepam (VALIUM) 10 MG tablet Take one pill one hour before procedure (Patient not taking: Reported on 03/14/2017) 2 tablet 0  . ferrous sulfate 325 (65 FE) MG tablet Take by mouth.    . levocetirizine (XYZAL) 5 MG tablet Take 1 tablet (5 mg total) by mouth every evening. (Patient not taking: Reported on 03/14/2017) 90 tablet 0  . montelukast (SINGULAIR) 10 MG  tablet Take 1 tablet (10 mg total) by mouth at bedtime. (Patient not taking: Reported on 03/14/2017) 90 tablet 0  . triamcinolone ointment (KENALOG) 0.5 % Apply 1 application topically 2 (two) times daily. (Patient not taking: Reported on 03/14/2017) 30 g 0   No facility-administered medications prior to visit.     Review of Systems;  Patient denies headache, fevers, malaise, unintentional weight loss, skin rash, eye pain, sinus congestion and sinus pain, sore throat, dysphagia,  hemoptysis , cough, dyspnea, wheezing, chest pain, palpitations, orthopnea, edema, abdominal pain, nausea, melena, diarrhea, constipation, flank pain, dysuria, hematuria, urinary  Frequency, nocturia, numbness, tingling, seizures,  Focal weakness, Loss of consciousness,  Tremor, insomnia, depression, anxiety, and suicidal ideation.      Objective:  BP 102/64 (BP Location: Left Arm, Patient Position: Sitting, Cuff Size: Normal)   Pulse 80   Temp 98.2 F (36.8 C) (Oral)   Resp 15   Ht 5\' 2"  (1.575 m)   Wt 123 lb 12.8 oz (56.2 kg)   SpO2 96%   BMI 22.64 kg/m   BP Readings from Last 3 Encounters:  03/14/17 102/64  11/06/16 (!) 99/58  10/01/16 90/60    Wt Readings from Last 3 Encounters:  03/14/17 123 lb 12.8 oz (56.2 kg)  10/01/16 120 lb (54.4 kg)  04/06/16 122 lb 8 oz (55.6 kg)    General Appearance:    Alert, cooperative, no distress, appears stated age  Head:    Normocephalic, without obvious abnormality,  atraumatic  Eyes:    PERRL, conjunctiva/corneas clear, EOM's intact, fundi    benign, both eyes  Ears:    Normal TM's and external ear canals, both ears  Nose:   Nares normal, septum midline, mucosa normal, no drainage    or sinus tenderness  Throat:   Lips, mucosa, and tongue normal; teeth and gums normal  Neck:   Supple, symmetrical, trachea midline, no adenopathy;    thyroid:  no enlargement/tenderness/nodules; no carotid   bruit or JVD  Back:     Symmetric, no curvature, ROM normal, no CVA  tenderness  Lungs:     Clear to auscultation bilaterally, respirations unlabored  Chest Wall:    No tenderness or deformity   Heart:    Regular rate and rhythm, S1 and S2 normal, no murmur, rub   or gallop  Breast Exam:    No tenderness, masses, or nipple abnormality  Abdomen:     Soft, non-tender, bowel sounds active all four quadrants,    no masses, no organomegaly  Genitalia:    Pelvic: cervix normal in appearance, external genitalia normal, no adnexal masses or tenderness, no cervical motion tenderness, rectovaginal septum normal, uterus normal size, shape, and consistency and vagina normal without discharge  Extremities:   Extremities normal, atraumatic, no cyanosis or edema  Pulses:   2+ and symmetric all extremities  Skin:   Skin color, texture, turgor normal, no rashes or lesions  Lymph nodes:   Cervical, supraclavicular, and axillary nodes normal  Neurologic:   CNII-XII intact, normal strength, sensation and reflexes    throughout    No results found for: HGBA1C  Lab Results  Component Value Date   CREATININE 0.74 08/22/2015   CREATININE 0.62 09/21/2014   CREATININE 0.7 12/10/2013    Lab Results  Component Value Date   WBC 3.3 (L) 03/14/2017   HGB 12.8 03/14/2017   HCT 37.7 03/14/2017   PLT 266.0 03/14/2017   GLUCOSE 95 08/22/2015   CHOL 205 (H) 03/14/2017   TRIG 68.0 03/14/2017   HDL 80.20 03/14/2017   LDLCALC 111 (H) 03/14/2017   ALT 21 08/22/2015   AST 21 08/22/2015   NA 142 08/22/2015   K 4.5 08/22/2015   CL 104 08/22/2015   CREATININE 0.74 08/22/2015   BUN 19 08/22/2015   CO2 31 08/22/2015   TSH 0.62 03/14/2017    Mm Screening Breast Tomo Bilateral  Result Date: 12/10/2016 CLINICAL DATA:  Screening. EXAM: 2D DIGITAL SCREENING BILATERAL MAMMOGRAM WITH CAD AND ADJUNCT TOMO COMPARISON:  Previous exam(s). ACR Breast Density Category c: The breast tissue is heterogeneously dense, which may obscure small masses. FINDINGS: There are no findings suspicious for  malignancy. Images were processed with CAD. IMPRESSION: No mammographic evidence of malignancy. A result letter of this screening mammogram will be mailed directly to the patient. RECOMMENDATION: Screening mammogram in one year. (Code:SM-B-01Y) BI-RADS CATEGORY  1: Negative. Electronically Signed   By: Frederico Hamman M.D.   On: 12/10/2016 16:12    Assessment & Plan:   Problem List Items Addressed This Visit    Routine general medical examination at a health care facility (Chronic)    Annual comprehensive preventive exam was done as well as an evaluation and management of chronic conditions .  During the course of the visit the patient was educated and counseled about appropriate screening and preventive services including :  diabetes screening, lipid analysis with projected  10 year  risk for CAD , nutrition counseling, breast, cervical and colorectal  cancer screening, and recommended immunizations.  Printed recommendations for health maintenance screenings was given      Insomnia   Relevant Medications   zolpidem (AMBIEN) 10 MG tablet    Other Visit Diagnoses    B12 deficiency    -  Primary   Relevant Orders   B12 (Completed)   Methylmalonic Acid   CBC with Differential/Platelet (Completed)   Cervical cancer screening       Relevant Orders   Cytology - PAP   Screening for hyperlipidemia       Relevant Orders   Lipid panel (Completed)   Screening for HIV (human immunodeficiency virus)       Relevant Orders   HIV antibody (Completed)   Fatigue, unspecified type       Relevant Orders   TSH (Completed)   Vitamin D deficiency       Relevant Orders   VITAMIN D 25 Hydroxy (Vit-D Deficiency, Fractures) (Completed)   Need for diphtheria-tetanus-pertussis (Tdap) vaccine       Relevant Orders   Tdap vaccine greater than or equal to 7yo IM (Completed)      I have discontinued [name] R. Reffett's triamcinolone ointment, levocetirizine, montelukast, and diazepam. I am also having her  start on scopolamine, ondansetron, and levofloxacin. Additionally, I am having her maintain her ferrous sulfate, valACYclovir, metaxalone, Diclofenac-miSOPROStol, and zolpidem.  Meds ordered this encounter  Medications  . scopolamine (TRANSDERM-SCOP, 1.5 MG,) 1 MG/3DAYS    Sig: Place 1 patch (1.5 mg total) onto the skin every 3 (three) days.    Dispense:  4 patch    Refill:  0  . ondansetron (ZOFRAN ODT) 4 MG disintegrating tablet    Sig: Take 1 tablet (4 mg total) by mouth every 8 (eight) hours as needed for nausea or vomiting.    Dispense:  20 tablet    Refill:  0  . levofloxacin (LEVAQUIN) 500 MG tablet    Sig: Take 1 tablet (500 mg total) by mouth daily.    Dispense:  7 tablet    Refill:  0  . DISCONTD: zolpidem (AMBIEN) 10 MG tablet    Sig: TAKE 1 TABLET BY MOUTH AT BEDTIME AS NEEDED FOR INSOMNIA    Dispense:  40 tablet    Refill:  5  . zolpidem (AMBIEN) 10 MG tablet    Sig: TAKE 1 TABLET BY MOUTH AT BEDTIME AS NEEDED FOR INSOMNIA    Dispense:  30 tablet    Refill:  5    Medications Discontinued During This Encounter  Medication Reason  . diazepam (VALIUM) 10 MG tablet Patient has not taken in last 30 days  . ferrous sulfate 325 (65 FE) MG tablet Duplicate  . levocetirizine (XYZAL) 5 MG tablet Completed Course  . montelukast (SINGULAIR) 10 MG tablet Completed Course  . triamcinolone ointment (KENALOG) 0.5 % Completed Course  . zolpidem (AMBIEN) 10 MG tablet Reorder  . zolpidem (AMBIEN) 10 MG tablet     Follow-up: Return in about 6 months (around 09/11/2017).   Sherlene Shams, MD

## 2017-03-14 NOTE — Telephone Encounter (Signed)
Okey RegalCarol[facility], she says "there is a potential interaction between Levaquin and Zofran. Do the doctor want me to fill it?" I verified that she is wanting to know if the doctor is ok with filling this, and she said "yes." I advised this would be sent over and someone will be in touch with an answer.

## 2017-03-14 NOTE — Telephone Encounter (Signed)
Copied from CRM [id number]. Topic: Quick Communication - Rx Refill/Question >> Mar 14, 2017 11:21 AM Maia Pettiesrtiz, Kristie S wrote: Possible drug interaction for pt on zofran and levaquin.  Please call back.

## 2017-03-14 NOTE — Telephone Encounter (Signed)
Pharmacy was concerned regarding a possible prolonged QT with zofran and levaquin.  Since she does not need today, I am going to forward this to you to confirm ok to refill.  Pt aware.  I did not see where she had any cardiac issues or h/o prolonged QT, but since she did not need today - send to you since you know her history.  Thanks

## 2017-03-15 LAB — HIV ANTIBODY (ROUTINE TESTING W REFLEX): HIV 1&2 Ab, 4th Generation: NONREACTIVE

## 2017-03-15 NOTE — Telephone Encounter (Signed)
Noted.  See Dr Melina Schoolsullo's response.  Please let pharmacy know.  Thanks

## 2017-03-15 NOTE — Telephone Encounter (Signed)
Spoke with pharmacy and informed them that Dr. Darrick Huntsmanullo is okay with going ahead and filling the rxs because the pt will not be taking them together. Pharmacy gave a verbal understanding and stated that they would get the rxs ready.

## 2017-03-16 NOTE — Assessment & Plan Note (Signed)
Annual comprehensive preventive exam was done as well as an evaluation and management of chronic conditions .  During the course of the visit the patient was educated and counseled about appropriate screening and preventive services including :  diabetes screening, lipid analysis with projected  10 year  risk for CAD , nutrition counseling, breast, cervical and colorectal cancer screening, and recommended immunizations.  Printed recommendations for health maintenance screenings was given 

## 2017-03-18 ENCOUNTER — Encounter: Payer: Self-pay | Admitting: *Deleted

## 2017-03-19 LAB — CYTOLOGY - PAP
Diagnosis: NEGATIVE
HPV: NOT DETECTED

## 2017-03-19 LAB — METHYLMALONIC ACID, SERUM: Methylmalonic Acid, Quant: 151 nmol/L (ref 87–318)

## 2017-03-26 ENCOUNTER — Telehealth: Payer: Self-pay | Admitting: Family

## 2017-03-26 DIAGNOSIS — Z8639 Personal history of other endocrine, nutritional and metabolic disease: Secondary | ICD-10-CM

## 2017-03-26 NOTE — Telephone Encounter (Signed)
Copied from CRM [id number]. Topic: Quick Communication - Lab Results >> Mar 26, 2017  2:29 PM Guinevere FerrariMorris, Bob Eastwood E, NT wrote: Patient is calling to see if her lab results where in and would like a call back.

## 2017-03-27 NOTE — Telephone Encounter (Signed)
Spoke with pt and informed her of her lab results. The pt would like to know if these results mean that her iron level is good because she has always had such a time with her iron being low in the past?

## 2017-03-27 NOTE — Telephone Encounter (Signed)
Iron stores were not repeated since they were normal in 2017 ,  She is not anemic so it is unlikely that she has low iron,  But if she would like to be sure I will order them and you can give her a lab appt

## 2017-03-27 NOTE — Telephone Encounter (Signed)
Pt stated that she will just keep taking her iron once daily and then if she starts to feel sluggish more than normal she will call to see about getting it checked.

## 2017-03-27 NOTE — Telephone Encounter (Signed)
close

## 2017-05-02 ENCOUNTER — Telehealth: Payer: Self-pay | Admitting: *Deleted

## 2017-05-02 DIAGNOSIS — M545 Low back pain: Secondary | ICD-10-CM

## 2017-05-02 NOTE — Telephone Encounter (Signed)
Order placed

## 2017-05-06 ENCOUNTER — Other Ambulatory Visit: Payer: Self-pay | Admitting: Sports Medicine

## 2017-05-06 DIAGNOSIS — G8929 Other chronic pain: Secondary | ICD-10-CM

## 2017-05-06 DIAGNOSIS — M545 Low back pain: Principal | ICD-10-CM

## 2017-05-23 ENCOUNTER — Other Ambulatory Visit: Payer: 59

## 2017-06-03 ENCOUNTER — Encounter: Payer: Self-pay | Admitting: Sports Medicine

## 2017-06-03 ENCOUNTER — Ambulatory Visit (INDEPENDENT_AMBULATORY_CARE_PROVIDER_SITE_OTHER): Payer: 59 | Admitting: Sports Medicine

## 2017-06-03 VITALS — BP 100/64 | Ht 62.0 in | Wt 118.0 lb

## 2017-06-03 DIAGNOSIS — S5001XA Contusion of right elbow, initial encounter: Secondary | ICD-10-CM | POA: Diagnosis not present

## 2017-06-03 NOTE — Progress Notes (Signed)
   HPI  CC: Right Elbow Pain  Right Elbow Pain - patient presents with right elbow pain after a fall 4 weeks ago. She reports she was walking in the dark and tripped over a stair, catching her fall on the hardwood floor with her elbow.  She reports swelling and bruising after the fall, she used ice on the injury and it gradually improved. However, she continues to have a small, soft bump over the medial elbow without any associated redness or warmth. She denies any paresthesias. She has not had any pain with activity and reports that she has been mowing the lawn and carrying groceries without any pain.   Patient does have a history of cervical degenerative disc disease however reports that her pain is very localized over the medial elbow and she has not had any radicular-type symptoms.  Cc, smoking, FHx reviewed.  ROS: Per HPI; in addition no fever, no rash, no additional weakness, no additional numbness, no additional paresthesias, and no additional falls/injury.   Objective: BP 100/64   Ht  (1.575 m)   Wt 118 lb (53.5 kg)   BMI 21.58 kg/m  Gen: NAD, well groomed, a/o x3, normal affect.  CV: Well-perfused. Warm.  Resp: Non-labored.  Neuro: Sensation intact throughout. No gross coordination deficits.  Elbow, right: Patient reports TTP over a bump on the medial elbow. Inspection yields +swelling at the medial posterior elbow without evidence of bony deformity, erythema, ecchymosis, or rash. No effusion. Active and passive ROM intact in flexion/extension/supination/pronation. Strength 5/5 throughout. No TTP at the medial or lateral epicondyle. No pain with finger/wrist extension against resistance. No pain with gripping or finger/wrist flexion against resistance. No evidence of pain or laxity at the UCL.   Assessment and Plan:  1. Elbow Pain - most consistent with small hematoma over right medial elbow. Small fluid filled pocket noted on ultrasound in the office. Symptoms do not seem  consistent with a fracture given that patient is able to do activities such as mowing the lawn, so we will not order an XR today. - helix compression sleeve fitted in the office today - recommend continued activity, likely will take several weeks to resolve  2. Back pain - patient has a history of lumbar radiculopathy and she undergoes back injections to help manage this pain.  In between injections she manages pain with diclofenac and metaxalone.  - can refill metaxalone as needed for back pain   Howard Pouch, MD PGY-2 [facility]  Patient seen and evaluated with the resident. I agree with the above plan of care. Patient has full elbow range of motion with no effusion. Small hematoma on the medial aspect of the elbow is confirmed by bedside ultrasound. I recommend compression with activity for the next several days. Reassurance that the hematoma should continue to resolve. Continue with activity as tolerated and follow-up for ongoing or recalcitrant issues.

## 2017-08-05 ENCOUNTER — Telehealth: Payer: Self-pay | Admitting: Family

## 2017-08-05 DIAGNOSIS — Z760 Encounter for issue of repeat prescription: Secondary | ICD-10-CM

## 2017-08-05 MED ORDER — DICLOFENAC-MISOPROSTOL 75-0.2 MG PO TBEC
DELAYED_RELEASE_TABLET | ORAL | 0 refills | Status: DC
Start: 2017-08-05 — End: 2017-10-30

## 2017-08-05 NOTE — Telephone Encounter (Signed)
Copied from CRM [id number]. Topic: Quick Communication - Rx Refill/Question >> Aug 05, 2017  9:21 AM Gaynelle AduPoole, Shalonda wrote: Medication: Diclofenac-Misoprostol 75-0.2 Mg, metaxalone (SKELAXIN) 800 MG tablet     Has the patient contacted their pharmacy? No   Preferred Pharmacy (with phone number or street name): [facility] [place] [phone] (Phone) [phone] (Fax)      Agent: Please be advised that RX refills may take up to 3 business days. We ask that you follow-up with your pharmacy.

## 2017-08-05 NOTE — Telephone Encounter (Signed)
Patient was last seen in 02/2017 This was last filled on 02-02-16 Patient has no schedule appt. Is this ok to refill?

## 2017-08-05 NOTE — Telephone Encounter (Signed)
Patient to return in 6 months per last office note

## 2017-09-17 ENCOUNTER — Other Ambulatory Visit: Payer: Self-pay | Admitting: Internal Medicine

## 2017-09-17 DIAGNOSIS — G4700 Insomnia, unspecified: Secondary | ICD-10-CM

## 2017-09-18 DIAGNOSIS — J209 Acute bronchitis, unspecified: Secondary | ICD-10-CM | POA: Diagnosis not present

## 2017-09-18 NOTE — Telephone Encounter (Signed)
Call pt  Please education patient. Ambien is controlled substance;  In order for me to prescribe medication,  Patient must be seen every 3-6 months. please make follow-up appointment.  I have given her 30 tabs. No more refills until she has a visit with me.  Please ensure she understands that.

## 2017-09-18 NOTE — Telephone Encounter (Signed)
Refilled: 03/14/2017 Last OV: 03/14/2017 Next OV: not scheduled

## 2017-10-08 NOTE — Telephone Encounter (Signed)
Left voice mail for patient to call back ok for [facility] to speak to patient    

## 2017-10-09 NOTE — Telephone Encounter (Signed)
Pt aware Jodi Lofflerambien is controlled and she will need to be seen every 3-6 months in order for refills. Pt verbalized understanding.  Pt states she breaks this pill in half, and this Rx will last her so long, she declined to make an appt at this time. But states she will call back in plenty of time to schedule an appt prior to running out.

## 2017-10-30 ENCOUNTER — Encounter: Payer: Self-pay | Admitting: Sports Medicine

## 2017-10-30 ENCOUNTER — Ambulatory Visit: Payer: 59 | Admitting: Sports Medicine

## 2017-10-30 VITALS — BP 98/70 | Ht 62.0 in | Wt 125.0 lb

## 2017-10-30 DIAGNOSIS — M7552 Bursitis of left shoulder: Secondary | ICD-10-CM | POA: Insufficient documentation

## 2017-10-30 DIAGNOSIS — M67912 Unspecified disorder of synovium and tendon, left shoulder: Secondary | ICD-10-CM | POA: Diagnosis not present

## 2017-10-30 DIAGNOSIS — Z760 Encounter for issue of repeat prescription: Secondary | ICD-10-CM | POA: Insufficient documentation

## 2017-10-30 MED ORDER — DICLOFENAC-MISOPROSTOL 75-0.2 MG PO TBEC
DELAYED_RELEASE_TABLET | ORAL | 2 refills | Status: DC
Start: 2017-10-30 — End: 2018-08-22

## 2017-10-30 MED ORDER — METAXALONE 800 MG PO TABS: 800.0000 mg | ORAL_TABLET | Freq: Two times a day (BID) | ORAL | 2 refills | Status: DC | PRN

## 2017-10-30 NOTE — Progress Notes (Signed)
ELEENA GRATER - 57 y.o. female MRN [id number]  Date of birth: 06-Feb-1960   Chief complaint: L shoulder pain  SUBJECTIVE:    History of present illness: Patient is a 57 year old female who presents today with a chief complaint of left shoulder pain.  She states back in April she had a fall on an outstretched hand when she tripped while she was at [employer].  She was able to catch herself however she noticed a sudden pain in her left shoulder.  That pain subsided in 2 to 3 weeks afterwards.  Last week however, she reports she was having difficulty sleeping on her left side.  She was having shoulder pain rated 5 out of 10 in nature and located in her superior and posterior aspects of her left shoulder.  Her pain was worse at night and worse with overhead movements.  It is also significantly worse with internal rotation activities.  She denies any significant weakness.  Denies any numbness or tingling of the extremity.  Denies any elbow pain or wrist pain associated with this.  She states she is having muscle spasms of the left side of her neck and upper trapezius.  She has tried taking intermittent anti-inflammatories without any significant relief.  No known history of injuries to the left shoulder in the past.  Denies any loss of grip strength. No fevers, chills, or night sweats.  Of note, the patient states she is having several people over for a party this weekend and would like to be full strength and not have side effects from any shoulder injections.   Review of systems:  As stated above   Interval past medical history, surgical history, family history, and social history obtained and are unchanged.   Medications reviewed and unchanged. Pertinent positives include: Arthrotec and Skelaxin. Allergies reviewed and unchanged.  OBJECTIVE:  Physical exam: Vital signs are reviewed. BP 98/70   Ht 5\' 2"  (1.575 m)   Wt 125 lb (56.7 kg)   BMI 22.86 kg/m   Gen.: Alert, oriented, appears stated  age, in no apparent distress Integumentary: No rashes or ecchymosis Neurologic: sensation intact to light touch C5-T1 Musculoskeletal: Inspection of the left shoulder demonstrates no acute abnormality.  She does have tenderness palpation in the supraspinatus fossa as well as in her upper trapezius muscle on the left.  She has restricted range of motion in flexion to approximately 170 degrees and abduction to 160 degrees.  She has guarding secondary to pain and discomfort with passive range of motion.  Strength testing 4 out of 5 in shoulder flexion, 5 out of 5 in shoulder extension, 4.5 out of 5 in shoulder abduction, and 5 out of 5 in shoulder adduction.  Discomfort elicited with internal rotation of the shoulder which reproduces her pain.  She internally rotates to her lateral hip on the left.  Positive Hawkins test.  Positive Neer's test.  Negative crossarm test.  Equivocal empty can test.  Negative drop arm test.  Negative speeds and Yergason's testing.  Negative O'Brien's test.  She is neurovascularly intact.    ASSESSMENT & PLAN: Subacromial bursitis of left shoulder joint - d/t probable small tear in rotator cuff - conservative therapy with oral NSAIDs and Skelaxin for Trap muscle spasm - pt declined ultrasound as she didn't have time today - would like steroid injection but will reschedule because she has a party this weekend and doesn't want any side effects. F/u in 1-2 weeks.  Medication refill - Arthotec and Skelaxin refilled -  counseled on common and severe side effects - take as prescribed - do not drive or drink alcohol while on these medicines   Meds ordered this encounter  Medications  . Diclofenac-miSOPROStol 75-0.2 MG TBEC    Sig: TAKE ONE TABLET TWICE A DAY AS NEEDED    Dispense:  60 tablet    Refill:  2  . metaxalone (SKELAXIN) 800 MG tablet    Sig: Take 1 tablet (800 mg total) by mouth 2 (two) times daily as needed for muscle spasms.    Dispense:  60 tablet     Refill:  2      Gustavus Messing, DO Sports Medicine Fellow [facility]

## 2017-10-30 NOTE — Patient Instructions (Addendum)
Today, we diagnosed you with subacromial bursitis. This is likely due to a small tear in your rotator cuff. I am recommending conservative treatment with scheduled arthrotec twice daily for 1-2 weeks. Also, you may take Skelaxin for muscle spasms as this is on your medication list. F/u in 1-2 weeks for Ultrasound of the shoulder if you have time and a possible shoulder injection with steroid.

## 2017-10-30 NOTE — Assessment & Plan Note (Signed)
-   d/t probable small tear in rotator cuff - conservative therapy with oral NSAIDs and Skelaxin for Trap muscle spasm - pt declined ultrasound as she didn't have time today - would like steroid injection but will reschedule because she has a party this weekend and doesn't want any side effects. F/u in 1-2 weeks.

## 2017-10-30 NOTE — Assessment & Plan Note (Signed)
-   Arthotec and Skelaxin refilled - counseled on common and severe side effects - take as prescribed - do not drive or drink alcohol while on these medicines

## 2017-11-13 ENCOUNTER — Ambulatory Visit: Payer: 59 | Admitting: Sports Medicine

## 2017-11-13 ENCOUNTER — Encounter: Payer: Self-pay | Admitting: Sports Medicine

## 2017-11-13 VITALS — BP 108/71 | Ht 62.0 in | Wt 120.0 lb

## 2017-11-13 DIAGNOSIS — M67912 Unspecified disorder of synovium and tendon, left shoulder: Secondary | ICD-10-CM

## 2017-11-13 MED ORDER — METHYLPREDNISOLONE ACETATE 40 MG/ML IJ SUSP
40.0000 mg | Freq: Once | INTRAMUSCULAR | Status: AC
  Administered 2017-11-13: 40 mg via INTRA_ARTICULAR

## 2017-11-13 NOTE — Assessment & Plan Note (Signed)
-  Diagnostic ultrasound of the left shoulder was performed today.  It demonstrated calcific tendinitis of the supraspinatus.  No significant tears in the rotator cuff.  INJECTION: Patient was given informed consent, signed copy in the chart. Appropriate time out was taken. Area prepped and draped in usual sterile fashion. 1 cc of methylprednisolone 40 mg/ml plus  2 cc of 1% lidocaine without epinephrine was injected using ultrasound guidance into the L glenohumeral and subacromial space using a(n) posterior US guided approach. The patient tolerated the procedure well. There were no complications. Post procedure instructions were given.  -Patient was given rotator cuff exercises to complete twice daily for the next 4 to 6 weeks.  She may continue with her anti-inflammatory and antispasmodic medicines that she is prescribed.  If she is not improving, I am recommending further testing i.e. an x-ray of her shoulder and/or an MRI of her shoulder to further work this up.  Patient is in agreement.

## 2017-11-13 NOTE — Progress Notes (Signed)
Jodi Anderson - 57 y.o. female MRN [id number]  Date of birth: 1960/03/28   Chief complaint: Left shoulder pain follow-up  SUBJECTIVE:    History of present illness: Patient is a 57 year old female who presents today for follow-up of her left shoulder pain.  Pain began last April when she had a [place].  It has persisted since then.  She was diagnosed with rotator cuff tendinopathy and subacromial bursitis last visit.  She was given refills of her diclofenac misoprostol and Skelaxin pills to help with pain and muscle spasms.  She states she continues to have pain with forward flexion and abduction movements.  She is here today for ultrasound of her shoulder and/or guided injection.  She has never had any injections of her shoulder yet.  She states today she is primarily having posterior shoulder pain and periscapular pain.  She does have a history of chronic neck pain and degenerative disc disease in her cervical region.  Pain is nonradiating in nature.  It is associated with positional movements of her shoulder.  No weakness in grip strength or elbow pain.  She states she has had no improvement since her initial injury.   Review of systems:  As stated above   Interval past medical history, surgical history, family history, and social history obtained and are unchanged.   Medications reviewed and unchanged. Allergies reviewed and unchanged.  OBJECTIVE:  Physical exam: Vital signs are reviewed. BP 108/71   Ht 5\' 2"  (1.575 m)   Wt 120 lb (54.4 kg)   BMI 21.95 kg/m   Gen.: Alert, oriented, appears stated age, in no apparent distress Integumentary: No rashes or ecchymoses Neurologic:  Sensation is intact to light touch C5-T1, negative Spurling's test bilaterally Gait: normal without associated limp Musculoskeletal: Inspection of the left shoulder demonstrates no obvious abnormality.  She has mild tenderness to palpation in the supraspinatus groove.  No  bicipital tenderness.  No tenderness in her periscapular region.  No myofascial bogginess.  She has full range of motion shoulder flexion and extension although uncomfortable at terminal flexion.  She has discomfort with abduction of her shoulder although able to abduct to 180 degrees.  Pain is elicited with external rotation and internal rotation of her shoulder.  Strength testing is 4.5 out of 5 in abduction, 4.5 out of 5 in resisted flexion.  Positive Neer's test.  Positive Hawkins test.  Negative crossarm test.  Negative O'Brien's testing.  Discomfort with empty can testing.  Negative speeds and Yergason's test.  ULTRASOUND: Shoulder, left Complete ultrasound imaging obtained of patient's left shoulder.  - No obvious evidence of bony deformity or osteophyte development appreciated.  - Long head of the biceps tendon: No evidence of tendon thickening, calcification, subluxation, or tearing in short or long axis views. No edema or bullseye sign.  - Subscapularis tendon: complete visualization across the width of the insertion point yielded no evidence of tendon thickening, calcification, or tears in the long axis view.  - Supraspinatus tendon: complete visualization across the width of the insertion point yielded calcific tendinitis of the superior facet at the site of insertion of the supraspinatus tendon.  No evidence of complete tears or partial tears. - Infraspinatus and teres minor tendons: visualization across the width of the insertion points yielded no evidence of tendon thickening, calcification, or tears in the long axis view.  [facility] Joint: Not visualized IMPRESSION: findings consistent with calcific tendinosis of the supraspinatus on the left.  ASSESSMENT & PLAN: Tendinopathy of left rotator cuff -Diagnostic ultrasound of the left shoulder was performed today.  It demonstrated calcific tendinitis of the supraspinatus.  No significant tears in the rotator cuff.  INJECTION: Patient was  given informed consent, signed copy in the chart. Appropriate time out was taken. Area prepped and draped in usual sterile fashion. 1 cc of methylprednisolone 40 mg/ml plus  2 cc of 1% lidocaine without epinephrine was injected using ultrasound guidance into the L glenohumeral and subacromial space using a(n) posterior US guided approach. The patient tolerated the procedure well. There were no complications. Post procedure instructions were given.  -Patient was given rotator cuff exercises to complete twice daily for the next 4 to 6 weeks.  She may continue with her anti-inflammatory and antispasmodic medicines that she is prescribed.  If she is not improving, I am recommending further testing i.e. an x-ray of her shoulder and/or an MRI of her shoulder to further work this up.  Patient is in agreement.     Meds ordered this encounter  Medications  . methylPREDNISolone acetate (DEPO-MEDROL) injection 40 mg      Gustavus Messing, DO Sports Medicine Fellow [facility]  I was available as preceptor for this visit. Reino Bellis, DO

## 2017-12-24 ENCOUNTER — Telehealth: Payer: Self-pay | Admitting: *Deleted

## 2017-12-24 DIAGNOSIS — M7552 Bursitis of left shoulder: Secondary | ICD-10-CM

## 2017-12-24 NOTE — Telephone Encounter (Signed)
Per Dr Laureen OchsPinney, order place for mri of left shoulder w/o contrast

## 2018-01-07 ENCOUNTER — Other Ambulatory Visit: Payer: 59

## 2018-01-13 ENCOUNTER — Encounter: Payer: Self-pay | Admitting: Family

## 2018-01-13 ENCOUNTER — Ambulatory Visit (INDEPENDENT_AMBULATORY_CARE_PROVIDER_SITE_OTHER)
Admission: RE | Admit: 2018-01-13 | Discharge: 2018-01-13 | Disposition: A | Discharge status: Home / Self Care | Payer: 59 | Source: Ambulatory Visit | Attending: Family | Admitting: Family

## 2018-01-13 ENCOUNTER — Ambulatory Visit: Payer: 59 | Admitting: Family

## 2018-01-13 VITALS — BP 106/76 | HR 92 | Temp 98.1°F | Ht 62.0 in | Wt 129.1 lb

## 2018-01-13 DIAGNOSIS — R05 Cough: Secondary | ICD-10-CM

## 2018-01-13 DIAGNOSIS — M25512 Pain in left shoulder: Secondary | ICD-10-CM

## 2018-01-13 DIAGNOSIS — R059 Cough, unspecified: Secondary | ICD-10-CM

## 2018-01-13 DIAGNOSIS — R0789 Other chest pain: Secondary | ICD-10-CM | POA: Diagnosis not present

## 2018-01-13 DIAGNOSIS — S4992XA Unspecified injury of left shoulder and upper arm, initial encounter: Secondary | ICD-10-CM | POA: Diagnosis not present

## 2018-01-13 NOTE — Progress Notes (Signed)
Jodi Anderson is a 57 y.o. female with the following history as recorded in Epic[employer]:  Patient Active Problem List   Diagnosis Date Noted  . Tendinopathy of left rotator cuff 10/30/2017  . Medication refill 10/30/2017  . Subacromial bursitis of left shoulder joint 10/30/2017  . Premature beats 04/06/2016  . Routine general medical examination at a health care facility 12/10/2013  . Palpitations 04/14/2013  . Insomnia 04/14/2013    Current Outpatient Medications  Medication Sig Dispense Refill  . Diclofenac-miSOPROStol 75-0.2 MG TBEC TAKE ONE TABLET TWICE A DAY AS NEEDED 60 tablet 2  . ferrous sulfate 325 (65 FE) MG tablet Take by mouth.    . metaxalone (SKELAXIN) 800 MG tablet Take 1 tablet (800 mg total) by mouth 2 (two) times daily as needed for muscle spasms. 60 tablet 2  . zolpidem (AMBIEN) 10 MG tablet TAKE 1 TABLET BY MOUTH AT BEDTIME AS NEEDED FOR INSOMNIA 30 tablet 0   No current facility-administered medications for this visit.     Allergies: Codeine  History reviewed. No pertinent past medical history.  Past Surgical History:  Procedure Laterality Date  . CESAREAN SECTION     3x  . tummy tuck    . VAGINAL DELIVERY      Family History  Problem Relation Age of Onset  . Epilepsy Son   . Cancer Paternal Grandmother        breast  . Cancer Maternal Aunt        lung    Social History   Tobacco Use  . Smoking status: Never Smoker  . Smokeless tobacco: Never Used  Substance Use Topics  . Alcohol use: Yes    Subjective:  Patient notes that she woke up Saturday evening around 3 am with coughing sensation; felt like she woke up with "burning sensation" in her chest- took about 30 minutes to calm down; able to sleep well last night; wanted to get her lungs checked today to make sure she did not aspirate/ develop pneumonia; she started herself on Clindamycin yesterday to cover for possible pneumonia- has taken 3 tablets; no fever, no wheezing; no prior history of GERD;  no chest pain or shortness of breath on exertion; no concerns for sleep apnea- does not snore/ has never been told that she stopped breathing in her sleep;      Objective:  Vitals:   01/13/18 1555  BP: 106/76  Pulse: 92  Temp: 98.1 F (36.7 C)  TempSrc: Oral  SpO2: 98%  Weight: 129 lb 1.3 oz (58.6 kg)  Height: 5\' 2"  (1.575 m)    General: Well developed, well nourished, in no acute distress  Skin : Warm and dry.  Head: Normocephalic and atraumatic  Eyes: Sclera and conjunctiva clear; pupils round and reactive to light; extraocular movements intact  Ears: External normal; canals clear; tympanic membranes normal  Oropharynx: Pink, supple. No suspicious lesions  Neck: Supple without thyromegaly, adenopathy  Lungs: Respirations unlabored; clear to auscultation bilaterally without wheeze, rales, rhonchi  CVS exam: normal rate and regular rhythm.  Neurologic: Alert and oriented; speech intact; face symmetrical; moves all extremities well; CNII-XII intact without focal deficit   Assessment:  1. Cough   2. Acute pain of left shoulder     Plan:  1. Isolated single episode of aspiration- physical exam is reassuring; discussed sleep apnea as possibility but patient denies any concerning symptoms; do not feel antibiotic is warranted; will update CXR for reassurance today; close follow-up with her PCP if  symptoms return/ persist- may need to see GI; 2. Update left shoulder X-ray per patient request; she will continue with her orthopedist as scheduled otherwise.   No follow-ups on file.  Orders Placed This Encounter  Procedures  . DG Chest 2 View    Standing Status:   Future    Number of Occurrences:   1    Standing Expiration Date:   03/17/2019    Order Specific Question:   Reason for Exam (SYMPTOM  OR DIAGNOSIS REQUIRED)    Answer:   cough    Order Specific Question:   Is patient pregnant?    Answer:   No    Order Specific Question:   Preferred imaging location?    Answer:    [address]    Order Specific Question:   Radiology Contrast Protocol - do NOT remove file path    Answer:   \\[email or web address].pdf  . DG Shoulder Left    Standing Status:   Future    Number of Occurrences:   1    Standing Expiration Date:   03/17/2019    Order Specific Question:   Reason for Exam (SYMPTOM  OR DIAGNOSIS REQUIRED)    Answer:   shoulder pain    Order Specific Question:   Is patient pregnant?    Answer:   No    Order Specific Question:   Preferred imaging location?    Answer:   [address]    Order Specific Question:   Radiology Contrast Protocol - do NOT remove file path    Answer:   \\[email or web address]    Requested Prescriptions    No prescriptions requested or ordered in this encounter

## 2018-01-16 ENCOUNTER — Other Ambulatory Visit: Payer: Self-pay | Admitting: Family

## 2018-01-16 DIAGNOSIS — G4700 Insomnia, unspecified: Secondary | ICD-10-CM

## 2018-01-16 NOTE — Telephone Encounter (Signed)
Requested medication (s) are due for refill today: yes  Requested medication (s) are on the active medication list: yes    Last refill: 09/18/17  #30 0 refills  Future visit scheduled yes 02/26/2018  Notes to clinic:not delegated     Patient requesting medication refill to hold her over until her follow up appointment 02/26/2018  Requested Prescriptions  Pending Prescriptions Disp Refills   zolpidem (AMBIEN) 10 MG tablet 30 tablet 0     Not Delegated - Psychiatry:  Anxiolytics/Hypnotics Failed - 01/16/2018 10:30 AM      Failed - This refill cannot be delegated      Failed - Urine Drug Screen completed in last 360 days.      Passed - Valid encounter within last 6 months    Recent Outpatient Visits          3 days ago Cough   [facility] [place], Allyne GeeLaura Woodruff, FNP   10 months ago B12 deficiency   [facility] [place] Sherlene Shamsullo, Teresa L, MD   1 year ago Premature beats   [facility] [place], [place], [place]   2 years ago Anemia, unspecified type   [facility]  Arnett, Lyn RecordsMargaret G, FNP   2 years ago Routine general medical examination at a health care facility   [facility]  Walker, Ginette PitmanJennifer A, MD      Future Appointments            In 1 month Arnett, Lyn RecordsMargaret G, FNP [facility] [place], [facility]

## 2018-01-16 NOTE — Telephone Encounter (Signed)
Copied from CRM [id number]. Topic: General - Other >> Jan 16, 2018 10:07 AM Elliot GaultBell, Tiffany M wrote: Relation to pt: self  Call back number: [phone]   Reason for call:  Patient requesting zolpidem (AMBIEN) 10 MG tablet  refill, stating she has a follow up appointment with PCP for 02/26/18. Informed patient please allow 48 to 72 hour turn around time. Patient states she was advised by pharmacy appointment was needed. Patient would like to know if medication can be refilled to hold her over until her follow up appointment., please advise

## 2018-01-17 MED ORDER — ZOLPIDEM TARTRATE 10 MG PO TABS: 10.0000 mg | ORAL_TABLET | Freq: Every evening | ORAL | 0 refills | Status: DC | PRN

## 2018-01-17 NOTE — Telephone Encounter (Signed)
Call pt  Please education patient. AMBIEN is controlled substance;  I sent a refill for Ambien however this is her last refill until seen as patient is overdue for a follow-up visit.    IN order for me to prescribe medication,  Patient must be seen every  months. please make follow-up appointment.   I looked up patient on [place] Controlled Substances Reporting System and saw no activity that raised concern of inappropriate use.

## 2018-01-27 ENCOUNTER — Other Ambulatory Visit: Payer: Self-pay | Admitting: Internal Medicine

## 2018-01-27 ENCOUNTER — Ambulatory Visit
Admission: RE | Admit: 2018-01-27 | Discharge: 2018-01-27 | Disposition: A | Discharge status: Home / Self Care | Payer: 59 | Source: Ambulatory Visit | Attending: Internal Medicine | Admitting: Internal Medicine

## 2018-01-27 DIAGNOSIS — Z1231 Encounter for screening mammogram for malignant neoplasm of breast: Secondary | ICD-10-CM

## 2018-02-04 ENCOUNTER — Ambulatory Visit (INDEPENDENT_AMBULATORY_CARE_PROVIDER_SITE_OTHER): Payer: Self-pay | Admitting: Orthopaedic Surgery

## 2018-02-11 ENCOUNTER — Ambulatory Visit (INDEPENDENT_AMBULATORY_CARE_PROVIDER_SITE_OTHER): Payer: Self-pay | Admitting: Orthopaedic Surgery

## 2018-02-12 ENCOUNTER — Encounter (INDEPENDENT_AMBULATORY_CARE_PROVIDER_SITE_OTHER): Payer: Self-pay | Admitting: Orthopaedic Surgery

## 2018-02-12 ENCOUNTER — Ambulatory Visit (INDEPENDENT_AMBULATORY_CARE_PROVIDER_SITE_OTHER): Payer: 59 | Admitting: Orthopaedic Surgery

## 2018-02-12 VITALS — BP 93/58 | HR 63

## 2018-02-12 DIAGNOSIS — M5136 Other intervertebral disc degeneration, lumbar region: Secondary | ICD-10-CM | POA: Diagnosis not present

## 2018-02-12 DIAGNOSIS — M47812 Spondylosis without myelopathy or radiculopathy, cervical region: Secondary | ICD-10-CM | POA: Insufficient documentation

## 2018-02-12 NOTE — Progress Notes (Signed)
Office Visit Note   Patient: Jodi Anderson           Date of Birth: July 16, 1960           MRN: [id number] Visit Date: 02/12/2018              Requested by: Allegra Grana, FNP [address] [place], [place] [address] PCP: Allegra Grana, FNP   Assessment & Plan: Visit Diagnoses:  1. Spondylosis without myelopathy or radiculopathy, cervical region   2. Other intervertebral disc degeneration, lumbar region     Plan: We will set patient up with some home cervical traction she can use.  We reviewed and discussed cervical foraminal stenosis and spondylosis that is present on her previous cervical MRI.  I plan to recheck her in 2 months.  Follow-Up Instructions: Return in about 2 months (around 04/13/2018).   Orders:  No orders of the defined types were placed in this encounter.  No orders of the defined types were placed in this encounter.     Procedures: No procedures performed   Clinical Data: No additional findings.   Subjective: Chief Complaint  Patient presents with  . Left Shoulder - Pain  . Lower Back - Pain    HPI 59 year old female seen with left shoulder pain and lumbar pain.  Previous left shoulder injection 11/13/2017 by Dr. Lovie Macadamia.  Patient seen Dr. Margaretha Sheffield had an MRI scan lumbar 02/23/2016.  Patient gotten some improvement with epidurals in the past.  She had a fall in the spring had ultrasound and states that she was told that her rotator cuff was intact.  Last shoulder injection was 2019.  Review of Systems positive for cervical spondylosis lumbar disc degeneration, shoulder impingement on the left.  History of palpitations insomnia.  L5-S1 foraminal stenosis by MRI scan February 2018 otherwise negative is obtains HPI.   Objective: Vital Signs: BP (!) 93/58   Pulse 63   LMP 06/23/2014 (Approximate)   Physical Exam Constitutional:      Appearance: She is well-developed.  HENT:     Head: Normocephalic.     Right Ear: External ear  normal.     Left Ear: External ear normal.  Eyes:     Pupils: Pupils are equal, round, and reactive to light.  Neck:     Thyroid: No thyromegaly.     Trachea: No tracheal deviation.  Cardiovascular:     Rate and Rhythm: Normal rate.  Pulmonary:     Effort: Pulmonary effort is normal.  Abdominal:     Palpations: Abdomen is soft.  Skin:    General: Skin is warm and dry.  Neurological:     Mental Status: She is alert and oriented to person, place, and time.  Psychiatric:        Behavior: Behavior normal.     Ortho Exam patient has brachial plexus tenderness worse on the left than right.  Negative drop arm test mild discomfort with impingement test left negative on the right elbows reach full extension of extremity reflexes are 1+ and symmetrical no lower extremity clonus normal heel toe walking.  Some discomfort with static palpation minimal discomfort with trochanteric bursal palpation knees reach full extension good quad strength.    Specialty Comments:  No specialty comments available.  Imaging: CLINICAL DATA:  Neck pain. Bilateral shoulder pain, predominantly on the right. Left arm and hand numbness.  EXAM: MRI CERVICAL SPINE WITHOUT CONTRAST  TECHNIQUE: Multiplanar, multisequence MR imaging of the cervical spine  was performed. No intravenous contrast was administered.  COMPARISON:  Cervical spine radiographs 02/01/2016  FINDINGS: Alignment: There is cervical spine straightening with slight reversal of the lordosis in the lower cervical spine. No significant listhesis.  Vertebrae: No evidence of fracture or suspicious osseous lesion. Marrow edema associated with right-sided facet arthritis at C3-4 and C4-5.  Cord: Normal signal and morphology.  Posterior Fossa, vertebral arteries, paraspinal tissues: Unremarkable.  Disc levels:  C2-3: Mild uncovertebral spurring and mild right facet arthrosis without significant stenosis.  C3-4: Minimal  uncovertebral spurring and advanced right facet arthrosis result in mild right neural foraminal stenosis without spinal stenosis.  C4-5: Disc bulging, prominent uncovertebral spurring, and advanced right facet arthrosis result in moderate right neural foraminal stenosis and potential right C5 nerve root impingement. There is partial effacement of the ventral thecal sac without significant spinal stenosis or spinal cord mass effect.  C5-6: Disc bulging, uncovertebral spurring, and advanced left facet arthrosis result in moderate left neural foraminal stenosis without spinal stenosis.  C6-7: Broad-based posterior disc osteophyte complex and mild facet arthrosis result in mild bilateral neural foraminal stenosis without spinal stenosis.  C7-T1: Minimal disc bulging scratched a tiny central disc protrusion without stenosis.  IMPRESSION: 1. Multilevel disc and advanced facet degeneration. Moderate right neural foraminal stenosis at C4-5 and moderate left foraminal stenosis at C5-6. 2. Mild foraminal narrowing at C3-4 and C6-7. 3. No spinal stenosis.   Electronically Signed   By: Sebastian Ache M.D.   On: 02/23/2016 11:38    PMFS History: Patient Active Problem List   Diagnosis Date Noted  . Spondylosis without myelopathy or radiculopathy, cervical region 02/12/2018  . Other intervertebral disc degeneration, lumbar region 02/12/2018  . Tendinopathy of left rotator cuff 10/30/2017  . Medication refill 10/30/2017  . Subacromial bursitis of left shoulder joint 10/30/2017  . Premature beats 04/06/2016  . Routine general medical examination at a health care facility 12/10/2013  . Palpitations 04/14/2013  . Insomnia 04/14/2013   History reviewed. No pertinent past medical history.  Family History  Problem Relation Age of Onset  . Epilepsy Son   . Cancer Paternal Grandmother        breast  . Cancer Maternal Aunt        lung    Past Surgical History:  Procedure  Laterality Date  . CESAREAN SECTION     3x  . tummy tuck    . VAGINAL DELIVERY     Social History   Occupational History  . Not on file  Tobacco Use  . Smoking status: Never Smoker  . Smokeless tobacco: Never Used  Substance and Sexual Activity  . Alcohol use: Yes  . Drug use: No  . Sexual activity: Not on file

## 2018-02-26 ENCOUNTER — Ambulatory Visit: Payer: 59 | Admitting: Family

## 2018-03-05 ENCOUNTER — Ambulatory Visit (INDEPENDENT_AMBULATORY_CARE_PROVIDER_SITE_OTHER): Payer: 59 | Admitting: Family

## 2018-03-05 ENCOUNTER — Encounter: Payer: Self-pay | Admitting: Family

## 2018-03-05 VITALS — BP 92/64 | HR 77 | Temp 98.8°F | Wt 122.2 lb

## 2018-03-05 DIAGNOSIS — G4700 Insomnia, unspecified: Secondary | ICD-10-CM

## 2018-03-05 DIAGNOSIS — M67912 Unspecified disorder of synovium and tendon, left shoulder: Secondary | ICD-10-CM

## 2018-03-05 MED ORDER — ZOLPIDEM TARTRATE 10 MG PO TABS: 10.0000 mg | ORAL_TABLET | Freq: Every evening | ORAL | 1 refills | Status: DC | PRN

## 2018-03-05 NOTE — Assessment & Plan Note (Signed)
Doing well on medication. Only takes half or one third tablet. No side effects. I looked up patient on [place] Controlled Substances Reporting System and saw no activity that raised concern of inappropriate use.  Refilled.

## 2018-03-05 NOTE — Assessment & Plan Note (Signed)
Following with Dr Ophelia CharterYates now, will follow

## 2018-03-05 NOTE — Patient Instructions (Addendum)
Always nice to see you.  Let me know if you need anything at all.   

## 2018-03-05 NOTE — Progress Notes (Signed)
Subjective:    Patient ID: Jodi Anderson, female    DOB: January 04, 1961, 58 y.o.   MRN: [id number]  CC: Jodi Anderson is a 58 y.o. female who presents today for follow up.   HPI: Doing well today. No complaints today.   Would like refill of ambien , has been taking 5mg , or 1/3 tablet. Doesnt take the 10mg .  No weird dreams. Occasional alcohol however doesn't drink alcohol when taking ambien.  No depression, anxiety.   Following with orthopedics , Dr Ophelia Charter for left shoulder pain. Takes skelxin, diclofenac prn for pain.   Follows with [facility] for back and neck epidural injections.        HISTORY:  History reviewed. No pertinent past medical history. Past Surgical History:  Procedure Laterality Date  . CESAREAN SECTION     3x  . tummy tuck    . VAGINAL DELIVERY     Family History  Problem Relation Age of Onset  . Epilepsy Son   . Cancer Paternal Grandmother        breast  . Cancer Maternal Aunt        lung    Allergies: Codeine Current Outpatient Medications on File Prior to Visit  Medication Sig Dispense Refill  . Diclofenac-miSOPROStol 75-0.2 MG TBEC TAKE ONE TABLET TWICE A DAY AS NEEDED 60 tablet 2  . ferrous sulfate 325 (65 FE) MG tablet Take by mouth.    . metaxalone (SKELAXIN) 800 MG tablet Take 1 tablet (800 mg total) by mouth 2 (two) times daily as needed for muscle spasms. 60 tablet 2   No current facility-administered medications on file prior to visit.     Social History   Tobacco Use  . Smoking status: Never Smoker  . Smokeless tobacco: Never Used  Substance Use Topics  . Alcohol use: Yes  . Drug use: No    Review of Systems  Constitutional: Negative for chills and fever.  Respiratory: Negative for cough.   Cardiovascular: Negative for chest pain and palpitations.  Gastrointestinal: Negative for nausea and vomiting.  Psychiatric/Behavioral: Positive for sleep disturbance.      Objective:    BP 92/64 (BP Location: Left Arm,  Patient Position: Sitting, Cuff Size: Normal)   Pulse 77   Temp 98.8 F (37.1 C)   Wt 122 lb 3.2 oz (55.4 kg)   LMP 06/23/2014 (Approximate)   SpO2 97%   BMI 22.35 kg/m  BP Readings from Last 3 Encounters:  03/05/18 92/64  02/12/18 (!) 93/58  01/13/18 106/76   Wt Readings from Last 3 Encounters:  03/05/18 122 lb 3.2 oz (55.4 kg)  01/13/18 129 lb 1.3 oz (58.6 kg)  11/13/17 120 lb (54.4 kg)    Physical Exam Vitals signs reviewed.  Constitutional:      Appearance: She is well-developed.  Eyes:     Conjunctiva/sclera: Conjunctivae normal.  Cardiovascular:     Rate and Rhythm: Normal rate and regular rhythm.     Pulses: Normal pulses.     Heart sounds: Normal heart sounds.  Pulmonary:     Effort: Pulmonary effort is normal.     Breath sounds: Normal breath sounds. No wheezing, rhonchi or rales.  Skin:    General: Skin is warm and dry.  Neurological:     Mental Status: She is alert.  Psychiatric:        Speech: Speech normal.        Behavior: Behavior normal.        Thought Content:  Thought content normal.        Assessment & Plan:   Problem List Items Addressed This Visit      Musculoskeletal and Integument   Tendinopathy of left rotator cuff    Following with Dr Ophelia CharterYates now, will follow        Other   Insomnia - Primary    Doing well on medication. Only takes half or one third tablet. No side effects. I looked up patient on [place] Controlled Substances Reporting System and saw no activity that raised concern of inappropriate use.  Refilled.       Relevant Medications   zolpidem (AMBIEN) 10 MG tablet       I am having Jodi Anderson maintain her ferrous sulfate, Diclofenac-miSOPROStol, metaxalone, and zolpidem.   Meds ordered this encounter  Medications  . zolpidem (AMBIEN) 10 MG tablet    Sig: Take 1 tablet (10 mg total) by mouth at bedtime as needed for sleep.    Dispense:  30 tablet    Refill:  1    Order Specific Question:   Supervising Provider     Answer:   Sherlene ShamsULLO, TERESA L [[id number]]    Return precautions given.   Risks, benefits, and alternatives of the medications and treatment plan prescribed today were discussed, and patient expressed understanding.   Education regarding symptom management and diagnosis given to patient on AVS.  Continue to follow with Allegra GranaArnett, Jodi Westwood Anderson, Jodi Anderson for routine health maintenance.   Jodi Anderson and I agreed with plan.   Rennie PlowmanMargaret Shawnda Mauney, Jodi Anderson

## 2018-04-15 ENCOUNTER — Ambulatory Visit (INDEPENDENT_AMBULATORY_CARE_PROVIDER_SITE_OTHER): Payer: 59 | Admitting: Orthopaedic Surgery

## 2018-05-07 ENCOUNTER — Other Ambulatory Visit: Payer: Self-pay | Admitting: Family

## 2018-05-07 DIAGNOSIS — G4700 Insomnia, unspecified: Secondary | ICD-10-CM

## 2018-05-09 NOTE — Telephone Encounter (Signed)
Call patient I refilled Ambien only for month supply.  If she is on a controlled substance, she needs to be seen every 3 to 4 months.  Please offer her Doxy  I looked up patient on [place] Controlled Substances Reporting System and saw no activity that raised concern of inappropriate use.

## 2018-07-11 ENCOUNTER — Ambulatory Visit: Payer: 59

## 2018-07-11 ENCOUNTER — Ambulatory Visit (INDEPENDENT_AMBULATORY_CARE_PROVIDER_SITE_OTHER): Payer: 59 | Admitting: Orthopaedic Surgery

## 2018-07-11 ENCOUNTER — Other Ambulatory Visit: Payer: Self-pay

## 2018-07-11 VITALS — Ht 62.0 in | Wt 122.0 lb

## 2018-07-11 DIAGNOSIS — M79672 Pain in left foot: Secondary | ICD-10-CM | POA: Diagnosis not present

## 2018-07-11 DIAGNOSIS — M79671 Pain in right foot: Secondary | ICD-10-CM | POA: Diagnosis not present

## 2018-07-11 DIAGNOSIS — M25561 Pain in right knee: Secondary | ICD-10-CM

## 2018-07-11 NOTE — Progress Notes (Signed)
Office Visit Note   Patient: Jodi Anderson           Date of Birth: 02/04/1960           MRN: [id number] Visit Date: 07/11/2018              Requested by: Allegra GranaArnett, Margaret G, FNP [address] [place] [id number] PCP: Allegra GranaArnett, Margaret G, FNP   Assessment & Plan: Visit Diagnoses:  1. Right knee pain, unspecified chronicity   2. Bilateral foot pain     Plan: We discussed shoewear wear shoes that have a higher arch buildup to help unload the midfoot since she likes to do a lot of walking on a daily basis.  She does not have a cavus foot deformity plantar fascia is normal and I think if she uses shoes that help unload the the posterior tibial tendon she should get improvement in her symptoms.  She has some mild chondromalacia patella worse on the left than right knee but no specific treatment is required she certainly does not have any evidence of significant arthritis in her knee which was her principal concern.  She can return if she has increased symptoms.  Follow-Up Instructions: No follow-ups on file.   Orders:  Orders Placed This Encounter  Procedures  . XR Knee 1-2 Views Right  . XR Foot Complete Left  . XR Foot Complete Right   No orders of the defined types were placed in this encounter.     Procedures: No procedures performed   Clinical Data: No additional findings.   Subjective: Chief Complaint  Patient presents with  . Right Knee - Pain  . Left Foot - Pain  . Right Foot - Pain    HPI 58 year old female seen with bilateral knee pain with clicking of her right knee.  She states she walks 8 to 12 miles a day and also requested x-rays of her feet since she has had some pain medially over the medial navicular region.  She is used some Aleve which tends to help.  Past history of injury to her left toe with open repair described of the EHL insertion site.  She has had pain with ambulation medially over the navicular.  She usually walks in tennis  shoes.  At one point she had some custom inserts made.  She has had popping of her knees occasional slight swelling no definite locking no rheumatologic conditions other than some bursitis in her shoulder and some facet arthropathy and cervical spine as has responded to previous injection.  Patient states she has had some discomfort just distal to the tibial tubercle on the right knee over the anterior compartment but is not bothering her currently.  She describes some tingling or some muscle jumping or cramping type sensation but did not stop her from walking.  Review of Systems positive history of palpitations insomnia, rotator cuff tendinopathy.  Cervical spondylosis.  14 point review of systems otherwise negative as it pertains HPI.   Objective: Vital Signs: Ht 5\' 2"  (1.575 m)   Wt 122 lb (55.3 kg)   LMP 06/23/2014 (Approximate)   BMI 22.31 kg/m   Physical Exam Constitutional:      Appearance: She is well-developed.  HENT:     Head: Normocephalic.     Right Ear: External ear normal.     Left Ear: External ear normal.  Eyes:     Pupils: Pupils are equal, round, and reactive to light.  Neck:  Thyroid: No thyromegaly.     Trachea: No tracheal deviation.  Cardiovascular:     Rate and Rhythm: Normal rate.  Pulmonary:     Effort: Pulmonary effort is normal.  Abdominal:     Palpations: Abdomen is soft.  Skin:    General: Skin is warm and dry.  Neurological:     Mental Status: She is alert and oriented to person, place, and time.  Psychiatric:        Behavior: Behavior normal.     Ortho Exam patient has mild crepitus with knee extension and some discomfort with patellofemoral loading and quadriceps contracture with straight leg raising.  Negative hip range of motion mild sciatic notch tenderness on the left.  No trochanteric bursal tenderness negative straight leg raising 90 degrees knee and ankle jerk are intact.  Collateral ligaments crucial ligament exam both knees are  normal.  Knee and ankle jerk are intact.  Some tenderness at the posterior tibial insertion on the navicular.  Specialty Comments:  No specialty comments available.  Imaging: No results found.   PMFS History: Patient Active Problem List   Diagnosis Date Noted  . Spondylosis without myelopathy or radiculopathy, cervical region 02/12/2018  . Other intervertebral disc degeneration, lumbar region 02/12/2018  . Tendinopathy of left rotator cuff 10/30/2017  . Medication refill 10/30/2017  . Subacromial bursitis of left shoulder joint 10/30/2017  . Premature beats 04/06/2016  . Routine general medical examination at a health care facility 12/10/2013  . Palpitations 04/14/2013  . Insomnia 04/14/2013   No past medical history on file.  Family History  Problem Relation Age of Onset  . Epilepsy Son   . Cancer Paternal Grandmother        breast  . Cancer Maternal Aunt        lung    Past Surgical History:  Procedure Laterality Date  . CESAREAN SECTION     3x  . tummy tuck    . VAGINAL DELIVERY     Social History   Occupational History  . Not on file  Tobacco Use  . Smoking status: Never Smoker  . Smokeless tobacco: Never Used  Substance and Sexual Activity  . Alcohol use: Yes  . Drug use: No  . Sexual activity: Not on file

## 2018-08-19 ENCOUNTER — Telehealth: Payer: Self-pay | Admitting: Orthopaedic Surgery

## 2018-08-19 ENCOUNTER — Telehealth: Payer: Self-pay | Admitting: Family

## 2018-08-19 DIAGNOSIS — M5136 Other intervertebral disc degeneration, lumbar region: Secondary | ICD-10-CM

## 2018-08-19 DIAGNOSIS — M47812 Spondylosis without myelopathy or radiculopathy, cervical region: Secondary | ICD-10-CM

## 2018-08-19 DIAGNOSIS — G4700 Insomnia, unspecified: Secondary | ICD-10-CM

## 2018-08-19 DIAGNOSIS — Z760 Encounter for issue of repeat prescription: Secondary | ICD-10-CM

## 2018-08-19 NOTE — Telephone Encounter (Signed)
OK - thanks

## 2018-08-19 NOTE — Telephone Encounter (Signed)
Medication Refill - Medication: zolpidem (AMBIEN) 10 MG tablet, Diclofenac-Misoprostol (ARTHROTEC PO)   Preferred Pharmacy:  [place], [place] - [address] [phone] (Phone) [phone] (Fax)     Agent: Please be advised that RX refills may take up to 3 business days. We ask that you follow-up with your pharmacy.

## 2018-08-19 NOTE — Telephone Encounter (Signed)
Last Refill:  05/09/18  Last OV:  03/05/18

## 2018-08-19 NOTE — Telephone Encounter (Signed)
Please advise 

## 2018-08-19 NOTE — Telephone Encounter (Signed)
Patient called asked if she can be referred to Dr Rolla Flatten at [facility] for a left side back injection and left side neck injection. The number to contact patient is [phone]

## 2018-08-20 MED ORDER — ZOLPIDEM TARTRATE 10 MG PO TABS: 10.0000 mg | ORAL_TABLET | Freq: Every evening | ORAL | 1 refills | Status: DC | PRN

## 2018-08-20 NOTE — Telephone Encounter (Signed)
Call pt Refilled ambien  Please education patient. Ambien is controlled substance;  In order for me to prescribe medication,  Patient must be seen every 3-6 months. please make follow-up appointment.      I looked up patient on [place] Controlled Substances Reporting System and saw no activity that raised concern of inappropriate use.

## 2018-08-20 NOTE — Telephone Encounter (Signed)
Orders entered. I left voicemail for patient advising.

## 2018-08-22 ENCOUNTER — Encounter: Payer: Self-pay | Admitting: Family

## 2018-08-22 MED ORDER — DICLOFENAC-MISOPROSTOL 75-0.2 MG PO TBEC: DELAYED_RELEASE_TABLET | ORAL | 2 refills | Status: DC

## 2018-08-22 NOTE — Telephone Encounter (Signed)
Call pt  I refilled Diclofenac-Misoprostol  Please advise PRN use due to risk of cardiovascular events and GI ulcer on this medication.

## 2018-08-22 NOTE — Telephone Encounter (Signed)
Patient notified verbalized understanding

## 2018-09-02 ENCOUNTER — Ambulatory Visit
Admission: RE | Admit: 2018-09-02 | Discharge: 2018-09-02 | Disposition: A | Discharge status: Home / Self Care | Payer: 59 | Source: Ambulatory Visit | Attending: Orthopaedic Surgery | Admitting: Orthopaedic Surgery

## 2018-09-02 ENCOUNTER — Other Ambulatory Visit: Payer: Self-pay

## 2018-09-02 DIAGNOSIS — M47812 Spondylosis without myelopathy or radiculopathy, cervical region: Secondary | ICD-10-CM

## 2018-09-02 MED ORDER — TRIAMCINOLONE ACETONIDE 40 MG/ML IJ SUSP (RADIOLOGY)
60.0000 mg | Freq: Once | INTRAMUSCULAR | Status: AC
  Administered 2018-09-02: 60 mg via EPIDURAL

## 2018-09-02 MED ORDER — IOPAMIDOL (ISOVUE-M 300) INJECTION 61%
1.0000 mL | Freq: Once | INTRAMUSCULAR | Status: AC | PRN
  Administered 2018-09-02: 15:00:00 1 mL via EPIDURAL

## 2018-09-02 NOTE — Discharge Instructions (Signed)

## 2018-09-08 ENCOUNTER — Other Ambulatory Visit: Payer: Self-pay

## 2018-09-08 ENCOUNTER — Ambulatory Visit (INDEPENDENT_AMBULATORY_CARE_PROVIDER_SITE_OTHER): Payer: 59 | Admitting: Family

## 2018-09-08 ENCOUNTER — Encounter: Payer: Self-pay | Admitting: Family

## 2018-09-08 DIAGNOSIS — M67912 Unspecified disorder of synovium and tendon, left shoulder: Secondary | ICD-10-CM | POA: Diagnosis not present

## 2018-09-08 DIAGNOSIS — G4700 Insomnia, unspecified: Secondary | ICD-10-CM

## 2018-09-08 NOTE — Assessment & Plan Note (Signed)
Chronic neck, shoulder,and back pain; following with Dr Lorin Mercy of [facility]. She is using diclofenac very sparingly due to AE of increased cardiovascular risk.  Will follow.

## 2018-09-08 NOTE — Assessment & Plan Note (Signed)
Doing well on medication; will continue.

## 2018-09-08 NOTE — Progress Notes (Signed)
This appt is f/u for Ambien.  Pt stopped taking the diclofenac.    Verbal consent for services obtained from patient prior to services given to TELEPHONE visit:   Location of call:  provider at work patient at home  Names of all persons present for services: Mable Paris, NP Chief complaint:   Feels well today.   No complaints.   Insomnia- Takes ambien 1/3 tablet each night with relief.   Chronic back, neck, and shoulder pain- improved.  Has stopped diclofenac as concerned by CVD risks. Had injection in neck with [facility], Dr Lorin Mercy last week which has helped with pain.   History, background, results pertinent:   Mammogram UTD  A/P/next steps:  Problem List Items Addressed This Visit      Musculoskeletal and Integument   Tendinopathy of left rotator cuff    Chronic neck, shoulder,and back pain; following with Dr Lorin Mercy of [facility]. She is using diclofenac very sparingly due to AE of increased cardiovascular risk.  Will follow.         Other   Insomnia    Doing well on medication; will continue.         I spent 15 min  discussing plan of care over the phone.

## 2018-09-15 ENCOUNTER — Ambulatory Visit
Admission: RE | Admit: 2018-09-15 | Discharge: 2018-09-15 | Disposition: A | Discharge status: Home / Self Care | Payer: 59 | Source: Ambulatory Visit | Attending: Orthopaedic Surgery | Admitting: Orthopaedic Surgery

## 2018-09-15 ENCOUNTER — Other Ambulatory Visit: Payer: Self-pay

## 2018-09-15 ENCOUNTER — Other Ambulatory Visit: Payer: Self-pay | Admitting: Orthopaedic Surgery

## 2018-09-15 DIAGNOSIS — M5136 Other intervertebral disc degeneration, lumbar region: Secondary | ICD-10-CM

## 2018-09-15 MED ORDER — METHYLPREDNISOLONE ACETATE 40 MG/ML INJ SUSP (RADIOLOG
120.0000 mg | Freq: Once | INTRAMUSCULAR | Status: AC
  Administered 2018-09-15: 120 mg via EPIDURAL

## 2018-09-15 MED ORDER — IOPAMIDOL (ISOVUE-M 200) INJECTION 41%
1.0000 mL | Freq: Once | INTRAMUSCULAR | Status: AC
  Administered 2018-09-15: 1 mL via EPIDURAL

## 2018-09-15 NOTE — Discharge Instructions (Signed)

## 2018-09-16 ENCOUNTER — Other Ambulatory Visit: Payer: Self-pay

## 2018-09-17 ENCOUNTER — Ambulatory Visit (INDEPENDENT_AMBULATORY_CARE_PROVIDER_SITE_OTHER): Payer: 59

## 2018-09-17 ENCOUNTER — Ambulatory Visit: Payer: 59 | Admitting: Family

## 2018-09-17 ENCOUNTER — Encounter: Payer: Self-pay | Admitting: Family

## 2018-09-17 ENCOUNTER — Other Ambulatory Visit: Payer: Self-pay

## 2018-09-17 VITALS — BP 92/62 | HR 60 | Temp 98.3°F | Wt 124.0 lb

## 2018-09-17 DIAGNOSIS — R0602 Shortness of breath: Secondary | ICD-10-CM

## 2018-09-17 LAB — IBC + FERRITIN
Ferritin: 47.7 ng/mL (ref 10.0–291.0)
Iron: 138 ug/dL (ref 42–145)
Saturation Ratios: 36.4 % (ref 20.0–50.0)
Transferrin: 271 mg/dL (ref 212.0–360.0)

## 2018-09-17 LAB — CBC WITH DIFFERENTIAL/PLATELET
Basophils Absolute: 0 10*3/uL (ref 0.0–0.1)
Basophils Relative: 0.6 % (ref 0.0–3.0)
Eosinophils Absolute: 0 10*3/uL (ref 0.0–0.7)
Eosinophils Relative: 0.4 % (ref 0.0–5.0)
HCT: 40.6 % (ref 36.0–46.0)
Hemoglobin: 13.7 g/dL (ref 12.0–15.0)
Lymphocytes Relative: 19.3 % (ref 12.0–46.0)
Lymphs Abs: 1.3 10*3/uL (ref 0.7–4.0)
MCHC: 33.8 g/dL (ref 30.0–36.0)
MCV: 90.4 fl (ref 78.0–100.0)
Monocytes Absolute: 0.7 10*3/uL (ref 0.1–1.0)
Monocytes Relative: 9.6 % (ref 3.0–12.0)
Neutro Abs: 4.9 10*3/uL (ref 1.4–7.7)
Neutrophils Relative %: 70.1 % (ref 43.0–77.0)
Platelets: 250 10*3/uL (ref 150.0–400.0)
RBC: 4.49 Mil/uL (ref 3.87–5.11)
RDW: 13.8 % (ref 11.5–15.5)
WBC: 7 10*3/uL (ref 4.0–10.5)

## 2018-09-17 LAB — TSH: TSH: 0.71 u[IU]/mL (ref 0.35–4.50)

## 2018-09-17 LAB — B12 AND FOLATE PANEL
Folate: 10.7 ng/mL (ref >5.9)
Vitamin B-12: 142 pg/mL — ABNORMAL LOW (ref 211–911)

## 2018-09-17 NOTE — Assessment & Plan Note (Addendum)
Patient does not appear in any acute respiratory distress in office today.  EKG shows no acute ischemia. NO significant changes from prior 2018. HR 53 however HR in office, walking normal. I suspect this because she had been lying supine for some time prior to EKG.  Walking SaO2 stayed above 98% and HR 97; not labored in speech. Pending labs, echocardiogram.  If she does not continue to improve, patient will let me know so we can proceed with further evaluation including consult with pulmonology, cardiology. She will let me know. Close f/u.

## 2018-09-17 NOTE — Progress Notes (Signed)
Subjective:    Patient ID: Jodi Anderson, female    DOB: 02/05/1960, 58 y.o.   MRN: [id number]  CC: Jodi Anderson is a 58 y.o. female who presents today for follow up.   HPI: Complains of feeling short of breath with exertion has been going on for several months off and on.  "Feels like low on iron".  One week ago she started taking iron 325 3 times a day and since then feels better.  She is a history of iron deficient anemia and she is seen by hematology, oncology appproximally 4 years ago ( Dr Sherrlyn HockPandit). She describes no pain with deep inspiration. No heavy lifting.    She denies exertional chest pain cough, wheezing, fever, leg swelling.  She does not feel short of breath when she sitting still.  She has no anxiety.  She notes an exercise habits and walks on this every day.  No history of any DVT, PE.   Mammogram, colonoscopy UTD  HISTORY:  History reviewed. No pertinent past medical history. Past Surgical History:  Procedure Laterality Date  . CESAREAN SECTION     3x  . tummy tuck    . VAGINAL DELIVERY     Family History  Problem Relation Age of Onset  . Epilepsy Son   . Cancer Paternal Grandmother        breast  . Cancer Maternal Aunt        lung    Allergies: Codeine Current Outpatient Medications on File Prior to Visit  Medication Sig Dispense Refill  . Diclofenac-miSOPROStol 75-0.2 MG TBEC TAKE ONE TABLET TWICE A DAY AS NEEDED 60 tablet 2  . ferrous sulfate 325 (65 FE) MG tablet Take 325 mg by mouth 3 (three) times daily with meals.     . metaxalone (SKELAXIN) 800 MG tablet Take 1 tablet (800 mg total) by mouth 2 (two) times daily as needed for muscle spasms. 60 tablet 2  . zolpidem (AMBIEN) 10 MG tablet Take 1 tablet (10 mg total) by mouth at bedtime as needed. for sleep 30 tablet 1   No current facility-administered medications on file prior to visit.     Social History   Tobacco Use  . Smoking status: Never Smoker  . Smokeless tobacco: Never Used   Substance Use Topics  . Alcohol use: Yes  . Drug use: No    Review of Systems  Constitutional: Negative for chills and fever.  HENT: Negative for congestion and sinus pressure.   Respiratory: Positive for shortness of breath. Negative for cough, chest tightness and wheezing.   Cardiovascular: Negative for chest pain and palpitations.  Gastrointestinal: Negative for constipation, nausea and vomiting.  Neurological: Negative for weakness, numbness and headaches.      Objective:    BP 92/62   Pulse 60   Temp 98.3 F (36.8 C)   Wt 124 lb (56.2 kg)   LMP 06/23/2014 (Approximate)   SpO2 98%   BMI 22.68 kg/m  BP Readings from Last 3 Encounters:  09/17/18 92/62  09/15/18 94/63  09/02/18 109/68   Wt Readings from Last 3 Encounters:  09/17/18 124 lb (56.2 kg)  07/11/18 122 lb (55.3 kg)  03/05/18 122 lb 3.2 oz (55.4 kg)    Physical Exam Vitals signs reviewed.  Constitutional:      Appearance: She is well-developed.  Eyes:     Conjunctiva/sclera: Conjunctivae normal.  Cardiovascular:     Rate and Rhythm: Normal rate and regular rhythm.  Pulses: Normal pulses.     Heart sounds: Normal heart sounds.  Pulmonary:     Effort: Pulmonary effort is normal.     Breath sounds: Normal breath sounds. No wheezing, rhonchi or rales.  Chest:     Chest wall: No tenderness.     Comments: No tenderness on palpation.  Skin:    General: Skin is warm and dry.  Neurological:     Mental Status: She is alert.  Psychiatric:        Speech: Speech normal.        Behavior: Behavior normal.        Thought Content: Thought content normal.        Assessment & Plan:   Problem List Items Addressed This Visit      Other   SOB (shortness of breath) on exertion - Primary    Patient does not appear in any acute respiratory distress in office today.  EKG shows no acute ischemia. NO significant changes from prior 2018. HR 53 however HR in office, walking normal. I suspect this because she had  been lying supine for some time prior to EKG.  Walking SaO2 stayed above 98% and HR 97; not labored in speech. Pending labs, echocardiogram.  If she does not continue to improve, patient will let me know so we can proceed with further evaluation including consult with pulmonology, cardiology. She will let me know. Close f/u.       Relevant Orders   B12 and Folate Panel   CBC with Differential/Platelet   IBC + Ferritin   TSH   EKG 12-Lead   EKG 12-Lead   ECHOCARDIOGRAM COMPLETE   DG Chest 2 View       I am having Jodi Anderson maintain her ferrous sulfate, metaxalone, zolpidem, and Diclofenac-miSOPROStol.   No orders of the defined types were placed in this encounter.   Return precautions given.   Risks, benefits, and alternatives of the medications and treatment plan prescribed today were discussed, and patient expressed understanding.   Education regarding symptom management and diagnosis given to patient on AVS.  Continue to follow with Burnard Hawthorne, FNP for routine health maintenance.   Jodi Anderson and I agreed with plan.   Mable Paris, FNP

## 2018-09-17 NOTE — Patient Instructions (Addendum)
Labs today  Until we get labs back, I would advise taking iron 325mg  once daily versus 3 times per day.  Today we discussed referrals, orders. echocardiogram  I have placed these orders in the system for you.  Please be sure to give [place] a call if you have not heard from our office regarding this. We should hear from [place] within ONE week with information regarding your appointment. If not, please let me know immediately.    Please let me know if you feel continue to improve or certainly develop new symptoms.  Follow up appointment in one month, sooner if symptom does not continue to improve.

## 2018-09-18 ENCOUNTER — Telehealth: Payer: Self-pay

## 2018-09-18 NOTE — Telephone Encounter (Signed)
Copied from [place] [id number]. Topic: General - Other >> Sep 18, 2018  9:05 AM Celene Kras A wrote: Reason for CRM: Pt called and is requesting to go over her lab results. Please advise.

## 2018-09-18 NOTE — Telephone Encounter (Signed)
I have sent request for labs to be resulted to [facility]. I spoke with patient & gave her results of chest xray.

## 2018-09-19 ENCOUNTER — Other Ambulatory Visit: Payer: Self-pay | Admitting: Family

## 2018-09-19 ENCOUNTER — Ambulatory Visit
Admission: RE | Admit: 2018-09-19 | Discharge: 2018-09-19 | Disposition: A | Discharge status: Home / Self Care | Payer: 59 | Source: Ambulatory Visit | Attending: Family | Admitting: Family

## 2018-09-19 ENCOUNTER — Other Ambulatory Visit: Payer: Self-pay

## 2018-09-19 DIAGNOSIS — R0602 Shortness of breath: Secondary | ICD-10-CM

## 2018-09-19 DIAGNOSIS — E538 Deficiency of other specified B group vitamins: Secondary | ICD-10-CM | POA: Insufficient documentation

## 2018-09-19 DIAGNOSIS — R079 Chest pain, unspecified: Secondary | ICD-10-CM

## 2018-09-19 MED ORDER — IOHEXOL 350 MG/ML SOLN
75.0000 mL | Freq: Once | INTRAVENOUS | Status: AC | PRN
  Administered 2018-09-19: 60 mL via INTRAVENOUS

## 2018-09-19 MED ORDER — CYANOCOBALAMIN 1000 MCG/ML IJ SOLN: INTRAMUSCULAR | 15 refills | Status: DC

## 2018-09-19 NOTE — Progress Notes (Signed)
Call pt I sent in [facility]; she will need to speak with about administering there OR we can teach her how to inject at home through nurse visit  B12 will go for 4 months; at that time advise her to call the office and have her b12 rechecked to see if normal and she may stop medication.   I ordered STAT CT angio. Advise her that Lenna Sciara will be contacting her TODAY and to be by her phone.

## 2018-09-20 ENCOUNTER — Encounter: Payer: Self-pay | Admitting: Family Medicine

## 2018-09-20 NOTE — Progress Notes (Signed)
Received call from RN call line that patient wanted to know results of CTA as she was going out of town.  Reviewed results and let her know there was no sign of or or other abnormalities.  She expressed understanding.

## 2018-09-22 ENCOUNTER — Telehealth: Payer: Self-pay | Admitting: Family

## 2018-09-22 NOTE — Telephone Encounter (Signed)
Call pt Wanted to circle back How is patient doing? Does she continue to have SOB?  If yes, we need to discuss pulmonology or cardiology.   Does patient want to schedule a visit for re-evaluation so we can determine next steps OR I can place referral?  Let me know.

## 2018-09-24 ENCOUNTER — Telehealth: Payer: Self-pay | Admitting: Family

## 2018-09-24 NOTE — Telephone Encounter (Signed)
Noted I called patient as well and waiting on return call

## 2018-09-24 NOTE — Telephone Encounter (Signed)
fyi Jodi Anderson  I called and left VM with patient ; I just wanted to hear from her myself and see how she is doing. Advised her to return our call.

## 2018-09-25 NOTE — Telephone Encounter (Signed)
Noted. I will send any message when she returns your call.

## 2018-09-30 ENCOUNTER — Telehealth: Payer: Self-pay | Admitting: Family

## 2018-09-30 MED ORDER — VALACYCLOVIR HCL 1 G PO TABS: 1000.0000 mg | ORAL_TABLET | Freq: Three times a day (TID) | ORAL | 0 refills | Status: DC

## 2018-09-30 NOTE — Telephone Encounter (Signed)
Valtrex was on her historical list,  So I sent it to [facility].  She needs to keep the virtual with lauren tomorrow about the outbreak and the hives.

## 2018-09-30 NOTE — Telephone Encounter (Signed)
Patient scheduled with Lauren for 10/01/18

## 2018-09-30 NOTE — Telephone Encounter (Signed)
Jodi Anderson, please advise patient that I'm not in office today. I'm not at my computer ( saw this via Epic app)   I have cc'ed Judson Roch so she can help get appt.   She can make a virtual with myself or lauren .

## 2018-09-30 NOTE — Telephone Encounter (Signed)
Patient has HX of herpes and she feels that what she has another break out now, she has been with anyone else but her husband and he has only  Been with her definitely wanted to PCP to understand this she was addiment that nurse advise PCP and no one but PCP. Patient requesting script be sent to  [place] I have set pharmacy in Epic patient requesting Valtrex script.

## 2018-09-30 NOTE — Telephone Encounter (Signed)
I left detailed message for patient advising that valtrex was sent, but she must keep tomorrow's appointment with Ander Purpura.  TY Dr. Derrel Nip!

## 2018-09-30 NOTE — Telephone Encounter (Signed)
pls have Jodi Anderson give her a call or make a virtual feels that she need Valtrex, thinks (knows) that she has had a breakout and  she has not had any sexual encounters but had this years ago and really needs this to be treated [phone]. A nurse call would be okay if you can call in something without talking her.

## 2018-10-01 ENCOUNTER — Telehealth: Payer: Self-pay | Admitting: Family

## 2018-10-01 ENCOUNTER — Encounter: Payer: Self-pay | Admitting: Family

## 2018-10-01 ENCOUNTER — Ambulatory Visit (INDEPENDENT_AMBULATORY_CARE_PROVIDER_SITE_OTHER): Payer: 59 | Admitting: Family Medicine

## 2018-10-01 ENCOUNTER — Other Ambulatory Visit: Payer: Self-pay

## 2018-10-01 DIAGNOSIS — R0602 Shortness of breath: Secondary | ICD-10-CM

## 2018-10-01 DIAGNOSIS — A6000 Herpesviral infection of urogenital system, unspecified: Secondary | ICD-10-CM | POA: Diagnosis not present

## 2018-10-01 DIAGNOSIS — L509 Urticaria, unspecified: Secondary | ICD-10-CM

## 2018-10-01 NOTE — Telephone Encounter (Signed)
Spoke with patient  She feels well today. Pleased with appointment with Ander Purpura today    Has been better and sob had resolved. No CP.  Feeling better after second b12 injection Yesterday when rash appeared, felt SOB return. Doesn't know how to describe. No crushing pain , wheezing ,cough. Appears to come and go. She doesn't feel she needs to be seen in ED in [facility] today.  Does note a lot of stress of late; husband had MI this year.   Agrees that cardiology referral for risk stratification and SOB; she will call us with any new worsening symptoms, concerns.    Melissa,  Can we schedule echo and also get patient scheduled with gollan in couple of weeks?

## 2018-10-01 NOTE — Telephone Encounter (Signed)
   Thank you so much for jumping in Dr Derrel Nip. I was not at a computer yesterday and sure patient is appreciative. I will follow up with patient today.

## 2018-10-01 NOTE — Progress Notes (Signed)
Patient ID: Jodi Anderson, female   DOB: 09/15/1960, 58 y.o.   MRN: [id number]    Virtual Visit via video Note  This visit type was conducted due to national recommendations for restrictions regarding the COVID-19 pandemic (e.g. social distancing).  This format is felt to be most appropriate for this patient at this time.  All issues noted in this document were discussed and addressed.  No physical exam was performed (except for noted visual exam findings with Video Visits).   I connected with Arletha PiliEmily Keech today at 11:20 AM EDT by a video enabled telemedicine application and verified that I am speaking with the correct person using two identifiers. Location patient: home Location provider: work or home office Persons participating in the virtual visit: patient, provider  I discussed the limitations, risks, security and privacy concerns of performing an evaluation and management service by video and the availability of in person appointments. I also discussed with the patient that there may be a patient responsible charge related to this service. The patient expressed understanding and agreed to proceed.  HPI:  Patient and I connected via video due to a genital herpes outbreak and also some hives on both of her elbows.  Patient states her last genital herpes outbreak was about 20 years ago; the out break prior was 25 years ago.  Patient states she has no concerns for any other STDs. Has not had sexual intercourse in months, has not been with anyone other than her husband.    Patient states she also has a rash/hives on both elbows.  States she has gotten this rash/hives outbreak many times before when she is extremely stressed.  Does have additional stress in her life due to her husband dealing with her issues and also having to care for her elderly mother who recently had a stroke.  Patient has a topical steroid cream she uses on the hives, started using it yesterday and is calming down the rash  and itching well.  Denies any shortness of breath, wheezing or any spreading of the rash or hives.   ROS: See pertinent positives and negatives per HPI.  No past medical history on file.  Past Surgical History:  Procedure Laterality Date   CESAREAN SECTION     3x   tummy tuck     VAGINAL DELIVERY      Family History  Problem Relation Age of Onset   Epilepsy Son    Cancer Paternal Grandmother        breast   Cancer Maternal Aunt        lung   Social History   Tobacco Use   Smoking status: Never Smoker   Smokeless tobacco: Never Used  Substance Use Topics   Alcohol use: Yes    Current Outpatient Medications:    cyanocobalamin (,VITAMIN B-12,) 1000 MCG/ML injection, 1000 mcg (1 mg) injection once per week for four weeks, followed by 1000 mcg injection once per month., Disp: 1 mL, Rfl: 15   Diclofenac-miSOPROStol 75-0.2 MG TBEC, TAKE ONE TABLET TWICE A DAY AS NEEDED, Disp: 60 tablet, Rfl: 2   ferrous sulfate 325 (65 FE) MG tablet, Take 325 mg by mouth 3 (three) times daily with meals. , Disp: , Rfl:    metaxalone (SKELAXIN) 800 MG tablet, Take 1 tablet (800 mg total) by mouth 2 (two) times daily as needed for muscle spasms., Disp: 60 tablet, Rfl: 2   valACYclovir (VALTREX) 1000 MG tablet, Take 1 tablet (1,000 mg total) by mouth  3 (three) times daily., Disp: 21 tablet, Rfl: 0   zolpidem (AMBIEN) 10 MG tablet, Take 1 tablet (10 mg total) by mouth at bedtime as needed. for sleep, Disp: 30 tablet, Rfl: 1  EXAM:  GENERAL: alert, oriented, appears well and in no acute distress  HEENT: atraumatic, conjunttiva clear, no obvious abnormalities on inspection of external nose and ears  NECK: normal movements of the head and neck  LUNGS: on inspection no signs of respiratory distress, breathing rate appears normal, no obvious gross SOB, gasping or wheezing  CV: no obvious cyanosis  MS: moves all visible extremities without noticeable abnormality  SKIN: Faint red  hive areas on bilat elbows, per patient they are improving.  PSYCH/NEURO: pleasant and cooperative, no obvious depression or anxiety, speech and thought processing grossly intact  ASSESSMENT AND PLAN:  Discussed the following assessment and plan:  Genital herpes - patient advised to complete Valtrex course as prescribed.  Reassured patient that this is the correct treatment plan that she does not have to have any concerns about anyone believing she has been unfaithful to her husband.  Advised patient that the herpes virus lives with in our body and can flareup from time to time, advised patient that years can pass without a outbreak.  Advised outbreaks often times are induced by increased stress which can lower her immune system allowing the outbreak to occur.  Hives -patient's hives are already going down with use of topical steroid cream, advised she can take some sort of antihistamine like a Claritin or Allegra to help complete the hives treatment.  Advised to monitor for any worsening of hives and let us know if these occur.  Advised if hives worsen and she develops any difficulty breathing to call 911 right away   I discussed the assessment and treatment plan with the patient. The patient was provided an opportunity to ask questions and all were answered. The patient agreed with the plan and demonstrated an understanding of the instructions.   The patient was advised to call back or seek an in-person evaluation if the symptoms worsen or if the condition fails to improve as anticipated.  15 minutes spent on video call with patient.   Jodelle Green, FNP

## 2018-10-10 NOTE — Telephone Encounter (Signed)
Noted   Echo sch 10/21/18 [facility] 10/28/18.

## 2018-10-21 ENCOUNTER — Ambulatory Visit
Admission: RE | Admit: 2018-10-21 | Discharge: 2018-10-21 | Disposition: A | Discharge status: Home / Self Care | Payer: 59 | Source: Ambulatory Visit | Attending: Family | Admitting: Family

## 2018-10-21 ENCOUNTER — Other Ambulatory Visit: Payer: Self-pay

## 2018-10-21 DIAGNOSIS — R0602 Shortness of breath: Secondary | ICD-10-CM

## 2018-10-21 NOTE — Progress Notes (Signed)
*  PRELIMINARY RESULTS* Echocardiogram 2D Echocardiogram has been performed.  Jodi Anderson 10/21/2018, 10:37 AM

## 2018-10-22 ENCOUNTER — Telehealth: Payer: Self-pay | Admitting: Cardiovascular Disease

## 2018-10-22 NOTE — Telephone Encounter (Signed)
Call to patient to discuss results of echo ordered by PCP. RN told her results but was not able to answer specific questions.  I let her know that results look stable and she has f/u on Tuesday and can talk with Dr. Rockey Situ at [facility] on tuesday.   Advised pt to call for any further questions or concerns.

## 2018-10-22 NOTE — Telephone Encounter (Signed)
Patient calling to discuss recent Echo testing results   Please call about pcp reported findings

## 2018-10-24 ENCOUNTER — Other Ambulatory Visit: Payer: Self-pay

## 2018-10-24 ENCOUNTER — Ambulatory Visit (INDEPENDENT_AMBULATORY_CARE_PROVIDER_SITE_OTHER): Payer: 59 | Admitting: Cardiology

## 2018-10-24 ENCOUNTER — Encounter: Payer: Self-pay | Admitting: Cardiology

## 2018-10-24 VITALS — BP 120/80 | HR 69 | Ht 62.0 in | Wt 123.0 lb

## 2018-10-24 DIAGNOSIS — R0602 Shortness of breath: Secondary | ICD-10-CM

## 2018-10-24 NOTE — Patient Instructions (Signed)
Medication Instructions:  Your physician recommends that you continue on your current medications as directed. Please refer to the Current Medication list given to you today.  If you need a refill on your cardiac medications before your next appointment, please call your pharmacy.   Lab work: None ordered  If you have labs (blood work) drawn today and your tests are completely normal, you will receive your results only by: Marland Kitchen MyChart Message (if you have MyChart) OR . A paper copy in the mail If you have any lab test that is abnormal or we need to change your treatment, we will call you to review the results.  Testing/Procedures: None ordered   Follow-Up: At [facility], you and your health needs are our priority.  As part of our continuing mission to provide you with exceptional heart care, we have created designated Provider Care Teams.  These Care Teams include your primary Cardiologist (physician) and Advanced Practice Providers (APPs -  Physician Assistants and Nurse Practitioners) who all work together to provide you with the care you need, when you need it. You will need a follow up appointment in as needed. You may see Kate Sable, MD or one of the following Advanced Practice Providers on your designated Care Team:   Murray Hodgkins, NP Christell Faith, PA-C . Marrianne Mood, PA-C

## 2018-10-24 NOTE — Progress Notes (Signed)
Cardiology Office Note:    Date:  10/24/2018   ID:  Jodi Anderson, DOB Jul 18, 1960, MRN [id number]  PCP:  Burnard Hawthorne, FNP  Cardiologist:  Kate Sable, MD  Electrophysiologist:  None   Referring MD: Burnard Hawthorne, FNP   Chief Complaint  Patient presents with  . New Patient (Initial Visit)    SOB with exertion    History of Present Illness:    Jodi Anderson is a 58 y.o. female with no significant past medical history who presents due to occasional shortness of breath.  Patient walks about 8 miles a day without chest pain.  She bikes about 2 times a week, biking up a hill and has noticed that towards the end of her bike she gets some shortness of breath.  She also notes shortness of breath when going up stairs of late.  She denies chest pain, palpitations, edema, orthopnea, syncope or presyncope.  She denies any family history of heart disease.  She is under a lot of stress she states, with her mom having strokes, and her husband having 2 heart attacks.  Her son also recently lost his job.  She is able to do all her activities of daily living without any limitations.  A recent pulmonary CT angiogram ordered by her primary care physician was was negative for PE.  An echocardiogram, date 10/21/2018 reviewed by myself had normal ejection fraction, trace TR and was otherwise normal with normal filling pressures.  History reviewed. No pertinent past medical history.  Past Surgical History:  Procedure Laterality Date  . CESAREAN SECTION     3x  . tummy tuck    . VAGINAL DELIVERY      Current Medications: Current Meds  Medication Sig  . zolpidem (AMBIEN) 10 MG tablet Take 1 tablet (10 mg total) by mouth at bedtime as needed. for sleep (Patient taking differently: Take 10 mg by mouth at bedtime as needed. Taking 1/3 tablet at HS)     Allergies:   Codeine   Social History   Socioeconomic History  . Marital status: Married    Spouse name: Not on file  . Number of  children: Not on file  . Years of education: Not on file  . Highest education level: Not on file  Occupational History  . Not on file  Social Needs  . Financial resource strain: Not on file  . Food insecurity    Worry: Not on file    Inability: Not on file  . Transportation needs    Medical: Not on file    Non-medical: Not on file  Tobacco Use  . Smoking status: Never Smoker  . Smokeless tobacco: Never Used  Substance and Sexual Activity  . Alcohol use: Yes  . Drug use: No  . Sexual activity: Not on file  Lifestyle  . Physical activity    Days per week: Not on file    Minutes per session: Not on file  . Stress: Not on file  Relationships  . Social Herbalist on phone: Not on file    Gets together: Not on file    Attends religious service: Not on file    Active member of club or organization: Not on file    Attends meetings of clubs or organizations: Not on file    Relationship status: Not on file  Other Topics Concern  . Not on file  Social History Narrative   Lives in [place]. 4 children, 1 son  at home has autism, 57 years old, [place]. Has dog and 3 cats.      Work - homemaker      Diet - regular diet      Exercise - walks daily, weights, trampoline     Family History: The patient's family history includes Cancer in her maternal aunt and paternal grandmother; Epilepsy in her son.  ROS:   Please see the history of present illness.     All other systems reviewed and are negative.  EKGs/Labs/Other Studies Reviewed:    The following studies were reviewed today: Echo 10/21/2018 IMPRESSIONS    1. Left ventricular ejection fraction, by visual estimation, is 55 to 60%. The left ventricle has normal function. Normal left ventricular size. There is no left ventricular hypertrophy.  2. Left ventricular diastolic Doppler parameters are consistent with impaired relaxation pattern of LV diastolic filling.  3. Global right ventricle has normal  systolic function.The right ventricular size is normal. No increase in right ventricular wall thickness.  4. Left atrial size was normal.  5. Right atrial size was normal.  6. Trivial pericardial effusion is present.  7. The mitral valve is normal in structure. No evidence of mitral valve regurgitation. No evidence of mitral stenosis.  8. The tricuspid valve is not well visualized. Tricuspid valve regurgitation is trivial.  9. The aortic valve was not well visualized Aortic valve regurgitation was not visualized by color flow Doppler. There is no aortic valve stenosis. 10. The pulmonic valve was not well visualized. Pulmonic valve regurgitation was not assessed by color flow Doppler. 11. TR signal is inadequate for assessing pulmonary artery systolic pressure. 12. The inferior vena cava is normal in size with greater than 50% respiratory variability, suggesting right atrial pressure of 3 mmHg.  EKG:  EKG is  ordered today.  The ekg ordered today demonstrates sinus rhythm, possible right atrial enlargement, otherwise normal ECG.  Recent Labs: 09/17/2018: Hemoglobin 13.7; Platelets 250.0; TSH 0.71  Recent Lipid Panel    Component Value Date/Time   CHOL 205 (H) 03/14/2017 1018   TRIG 68.0 03/14/2017 1018   HDL 80.20 03/14/2017 1018   CHOLHDL 3 03/14/2017 1018   VLDL 13.6 03/14/2017 1018   LDLCALC 111 (H) 03/14/2017 1018    Physical Exam:    VS:  BP 120/80 (BP Location: Right Arm, Patient Position: Sitting, Cuff Size: Normal)   Pulse 69   Ht 5\' 2"  (1.575 m)   Wt 123 lb (55.8 kg)   LMP 06/23/2014 (Approximate)   SpO2 97%   BMI 22.50 kg/m     Wt Readings from Last 3 Encounters:  10/24/18 123 lb (55.8 kg)  09/17/18 124 lb (56.2 kg)  07/11/18 122 lb (55.3 kg)     GEN:  Well nourished, well developed in no acute distress HEENT: Normal NECK: No JVD; No carotid bruits LYMPHATICS: No lymphadenopathy CARDIAC: RRR, no murmurs, rubs, gallops RESPIRATORY:  Clear to auscultation without  rales, wheezing or rhonchi  ABDOMEN: Soft, non-tender, non-distended MUSCULOSKELETAL:  No edema; No deformity  SKIN: Warm and dry NEUROLOGIC:  Alert and oriented x 3 PSYCHIATRIC:  Normal affect   ASSESSMENT:   Patient states being short of breath after biking uphill.  She walks for 8 miles without any chest pain or breathing problems, she does all her activities of daily living without any problems.  She has no risk factors for coronary artery disease.  Her echocardiogram was essentially normal.  She is otherwise low risk for  heart disease at this point.  No further cardiac testing is indicated at this point.  The multiple stresses in her life could be contributing.  1. SOB (shortness of breath) on exertion    PLAN:    In order of problems listed above:  1. I counseled patient on the results of her echocardiogram.  Told her symptoms where not likely due to cardiac origin, especially in light of normal echocardiogram.  Encouraged patient to work on external stress factors as much as she can.  Follow-up PRN.  Total encounter time more than 40 minutes  Greater than 50% was spent in counseling and coordination of care with the patient   Medication Adjustments/Labs and Tests Ordered: Current medicines are reviewed at length with the patient today.  Concerns regarding medicines are outlined above.  Orders Placed This Encounter  Procedures  . EKG 12-Lead   No orders of the defined types were placed in this encounter.   Patient Instructions  Medication Instructions:  Your physician recommends that you continue on your current medications as directed. Please refer to the Current Medication list given to you today.  If you need a refill on your cardiac medications before your next appointment, please call your pharmacy.   Lab work: None ordered  If you have labs (blood work) drawn today and your tests are completely normal, you will receive your results only by: Marland Kitchen MyChart Message (if  you have MyChart) OR . A paper copy in the mail If you have any lab test that is abnormal or we need to change your treatment, we will call you to review the results.  Testing/Procedures: None ordered   Follow-Up: At [facility], you and your health needs are our priority.  As part of our continuing mission to provide you with exceptional heart care, we have created designated Provider Care Teams.  These Care Teams include your primary Cardiologist (physician) and Advanced Practice Providers (APPs -  Physician Assistants and Nurse Practitioners) who all work together to provide you with the care you need, when you need it. You will need a follow up appointment in as needed. You may see Debbe Odea, MD or one of the following Advanced Practice Providers on your designated Care Team:   Nicolasa Ducking, NP Eula Listen, PA-C . Marisue Ivan, PA-C     Signed, Debbe Odea, MD  10/24/2018 10:00 AM    [facility]

## 2018-10-28 ENCOUNTER — Ambulatory Visit: Payer: 59 | Admitting: Cardiovascular Disease

## 2018-11-11 NOTE — Telephone Encounter (Signed)
error 

## 2018-11-13 ENCOUNTER — Telehealth: Payer: Self-pay | Admitting: Orthopaedic Surgery

## 2018-11-13 DIAGNOSIS — M5136 Other intervertebral disc degeneration, lumbar region: Secondary | ICD-10-CM

## 2018-11-13 DIAGNOSIS — M47812 Spondylosis without myelopathy or radiculopathy, cervical region: Secondary | ICD-10-CM

## 2018-11-13 NOTE — Telephone Encounter (Signed)
Patient called asked if she can be referred back to [facility] imaging for neck and back injection? The number to contact patient is [phone]

## 2018-11-13 NOTE — Telephone Encounter (Signed)
Please advise 

## 2018-11-19 NOTE — Telephone Encounter (Signed)
I called patient and advised. 

## 2018-11-19 NOTE — Telephone Encounter (Signed)
OK . Let her know insurance sometimes will not approve both or may require ROV and OV note review before they will approve but we are happy to send it in and hopefully they will approve thanks

## 2018-11-24 ENCOUNTER — Ambulatory Visit
Admission: RE | Admit: 2018-11-24 | Discharge: 2018-11-24 | Disposition: A | Discharge status: Home / Self Care | Payer: 59 | Source: Ambulatory Visit | Attending: Orthopaedic Surgery | Admitting: Orthopaedic Surgery

## 2018-11-24 ENCOUNTER — Other Ambulatory Visit: Payer: Self-pay | Admitting: Orthopaedic Surgery

## 2018-11-24 ENCOUNTER — Other Ambulatory Visit: Payer: Self-pay

## 2018-11-24 DIAGNOSIS — M47812 Spondylosis without myelopathy or radiculopathy, cervical region: Secondary | ICD-10-CM

## 2018-11-24 DIAGNOSIS — M5136 Other intervertebral disc degeneration, lumbar region: Secondary | ICD-10-CM

## 2018-11-24 MED ORDER — METHYLPREDNISOLONE ACETATE 40 MG/ML INJ SUSP (RADIOLOG
120.0000 mg | Freq: Once | INTRAMUSCULAR | Status: AC
  Administered 2018-11-24: 15:00:00 120 mg via EPIDURAL

## 2018-11-24 MED ORDER — METHYLPREDNISOLONE ACETATE 40 MG/ML INJ SUSP (RADIOLOG: 120.0000 mg | Freq: Once | INTRAMUSCULAR | Status: DC

## 2018-11-24 MED ORDER — IOPAMIDOL (ISOVUE-M 200) INJECTION 41%: 1.0000 mL | Freq: Once | INTRAMUSCULAR | Status: DC

## 2018-11-24 MED ORDER — IOPAMIDOL (ISOVUE-M 200) INJECTION 41%
1.0000 mL | Freq: Once | INTRAMUSCULAR | Status: AC
  Administered 2018-11-24: 15:00:00 1 mL via EPIDURAL

## 2018-11-24 NOTE — Discharge Instructions (Signed)

## 2019-01-28 ENCOUNTER — Ambulatory Visit: Payer: 59 | Attending: Internal Medicine

## 2019-01-28 DIAGNOSIS — Z20822 Contact with and (suspected) exposure to covid-19: Secondary | ICD-10-CM

## 2019-01-30 ENCOUNTER — Other Ambulatory Visit: Payer: Self-pay | Admitting: Family

## 2019-01-30 DIAGNOSIS — G4700 Insomnia, unspecified: Secondary | ICD-10-CM

## 2019-01-30 LAB — NOVEL CORONAVIRUS, NAA: SARS-CoV-2, NAA: NOT DETECTED

## 2019-02-04 NOTE — Telephone Encounter (Signed)
Refilled: 08/20/2018 Last OV: 09/17/2018 Next OV: not scheduled

## 2019-02-04 NOTE — Telephone Encounter (Signed)
Call pt   I have refilled your ambien for ONE MONTH.  However I wanted to remind you that this is controlled substance.   In order for me to prescribe medication,  patients must be seen every 3 months.   Please make follow-up appointment this month for any further refills.    I looked up patient on [place] Controlled Substances Reporting System and saw no activity that raised concern of inappropriate use.

## 2019-02-05 NOTE — Telephone Encounter (Signed)
Patent mychart message sent.

## 2019-03-02 ENCOUNTER — Telehealth: Payer: Self-pay | Admitting: Orthopaedic Surgery

## 2019-03-02 DIAGNOSIS — M5136 Other intervertebral disc degeneration, lumbar region: Secondary | ICD-10-CM

## 2019-03-02 NOTE — Telephone Encounter (Signed)
Patient called requesting a call back form imaging to call for injections in back area. Patient phone numbers is [phone]

## 2019-03-03 NOTE — Telephone Encounter (Signed)
Patient would like a referral entered for repeat ESI's at [facility]. OK for referral?

## 2019-03-03 NOTE — Telephone Encounter (Signed)
Ok thx.

## 2019-03-03 NOTE — Telephone Encounter (Signed)
Order entered

## 2019-03-09 ENCOUNTER — Ambulatory Visit: Admission: RE | Admit: 2019-03-09 | Payer: 59 | Source: Ambulatory Visit

## 2019-03-09 ENCOUNTER — Ambulatory Visit: Payer: 59 | Attending: Internal Medicine

## 2019-03-09 DIAGNOSIS — Z23 Encounter for immunization: Secondary | ICD-10-CM

## 2019-03-09 NOTE — Progress Notes (Signed)
   Covid-19 Vaccination Clinic  Name:  Jodi Anderson    MRN: [id number] DOB: 26-Sep-1960  03/09/2019  [name] was observed post Covid-19 immunization for 15 minutes without incidence. She was provided with Vaccine Information Sheet and instruction to access the V-Safe system.   [name] was instructed to call 911 with any severe reactions post vaccine: Marland Kitchen Difficulty breathing  . Swelling of your face and throat  . A fast heartbeat  . A bad rash all over your body  . Dizziness and weakness    Immunizations Administered    Name Date Dose VIS Date Route   Pfizer COVID-19 Vaccine 03/09/2019 10:31 AM 0.3 mL 01/02/2019 Intramuscular   Manufacturer: [employer], [employer]   Lot: [id number]   NDC: [address]-6

## 2019-04-06 ENCOUNTER — Ambulatory Visit (INDEPENDENT_AMBULATORY_CARE_PROVIDER_SITE_OTHER): Payer: Self-pay

## 2019-04-06 ENCOUNTER — Other Ambulatory Visit: Payer: Self-pay

## 2019-04-06 DIAGNOSIS — L988 Other specified disorders of the skin and subcutaneous tissue: Secondary | ICD-10-CM

## 2019-04-06 DIAGNOSIS — I781 Nevus, non-neoplastic: Secondary | ICD-10-CM

## 2019-04-06 DIAGNOSIS — L578 Other skin changes due to chronic exposure to nonionizing radiation: Secondary | ICD-10-CM

## 2019-04-06 DIAGNOSIS — L814 Other melanin hyperpigmentation: Secondary | ICD-10-CM

## 2019-04-07 NOTE — Progress Notes (Signed)
Pt tolerated the procedure well. Discussed Halo for Fall/Winter.

## 2019-04-08 ENCOUNTER — Ambulatory Visit: Payer: 59 | Attending: Internal Medicine

## 2019-04-08 DIAGNOSIS — Z23 Encounter for immunization: Secondary | ICD-10-CM

## 2019-04-08 NOTE — Progress Notes (Signed)
   Covid-19 Vaccination Clinic  Name:  Jodi Anderson    MRN: [id number] DOB: 22-Aug-1960  04/08/2019  [name] was observed post Covid-19 immunization for 15 minutes without incident. She was provided with Vaccine Information Sheet and instruction to access the V-Safe system.   [name] was instructed to call 911 with any severe reactions post vaccine: Marland Kitchen Difficulty breathing  . Swelling of face and throat  . A fast heartbeat  . A bad rash all over body  . Dizziness and weakness   Immunizations Administered    Name Date Dose VIS Date Route   Pfizer COVID-19 Vaccine 04/08/2019 10:09 AM 0.3 mL 01/02/2019 Intramuscular   Manufacturer: [employer], [employer]   Lot: [id number]   NDC: [address]-3

## 2019-04-21 ENCOUNTER — Ambulatory Visit
Admission: RE | Admit: 2019-04-21 | Discharge: 2019-04-21 | Disposition: A | Discharge status: Home / Self Care | Payer: 59 | Source: Ambulatory Visit | Attending: Orthopaedic Surgery | Admitting: Orthopaedic Surgery

## 2019-04-21 ENCOUNTER — Other Ambulatory Visit: Payer: Self-pay | Admitting: Family

## 2019-04-21 ENCOUNTER — Other Ambulatory Visit: Payer: Self-pay

## 2019-04-21 DIAGNOSIS — G4700 Insomnia, unspecified: Secondary | ICD-10-CM

## 2019-04-21 DIAGNOSIS — M5136 Other intervertebral disc degeneration, lumbar region: Secondary | ICD-10-CM

## 2019-04-21 MED ORDER — IOPAMIDOL (ISOVUE-M 200) INJECTION 41%
1.0000 mL | Freq: Once | INTRAMUSCULAR | Status: AC
  Administered 2019-04-21: 1 mL via EPIDURAL

## 2019-04-21 MED ORDER — METHYLPREDNISOLONE ACETATE 40 MG/ML INJ SUSP (RADIOLOG
120.0000 mg | Freq: Once | INTRAMUSCULAR | Status: AC
  Administered 2019-04-21: 120 mg via EPIDURAL

## 2019-04-21 NOTE — Telephone Encounter (Signed)
Call pt   I have refilled your ambien  However I wanted to remind you that this is controlled substance.   In order for me to prescribe medication,  patients must be seen every 3 months.   Please make follow-up appointment this month for any further refills.    I looked up patient on [place] Controlled Substances Reporting System and saw no activity that raised concern of inappropriate use.    

## 2019-04-21 NOTE — Discharge Instructions (Signed)

## 2019-04-21 NOTE — Telephone Encounter (Signed)
Refill request for Ambien, last seen 09/19/18, last filled 02-04-19.  Please advise.

## 2019-04-22 NOTE — Telephone Encounter (Signed)
I have sent mychart message to patient.  °

## 2019-04-27 ENCOUNTER — Telehealth: Payer: Self-pay | Admitting: Orthopaedic Surgery

## 2019-04-27 DIAGNOSIS — M542 Cervicalgia: Secondary | ICD-10-CM

## 2019-04-27 NOTE — Telephone Encounter (Signed)
Patient called.   She is wanting to be set up for another MRI for her neck but needs the sign off from [place].   Call back: [phone]

## 2019-04-27 NOTE — Telephone Encounter (Signed)
Please advise 

## 2019-04-27 NOTE — Addendum Note (Signed)
Addended by: Rogers Seeds on: 04/27/2019 03:14 PM   Modules accepted: Orders

## 2019-04-27 NOTE — Telephone Encounter (Signed)
I called and discussed. Schedule for cervical MRI increased neck and shoulder pain times 1.5 yrs after a fall with neck and left shoulder pain, previous MRI moderate to severe foraminal stenosis . ROV after scan thanks

## 2019-04-27 NOTE — Telephone Encounter (Signed)
Order entered into system.  

## 2019-04-28 ENCOUNTER — Other Ambulatory Visit: Payer: 59

## 2019-05-25 ENCOUNTER — Other Ambulatory Visit: Payer: 59

## 2019-06-03 ENCOUNTER — Encounter: Payer: Self-pay | Admitting: Orthopaedic Surgery

## 2019-06-03 ENCOUNTER — Other Ambulatory Visit: Payer: Self-pay

## 2019-06-03 ENCOUNTER — Ambulatory Visit: Payer: 59 | Admitting: Orthopaedic Surgery

## 2019-06-03 VITALS — BP 103/71 | HR 68 | Ht 62.0 in | Wt 121.0 lb

## 2019-06-03 DIAGNOSIS — M7552 Bursitis of left shoulder: Secondary | ICD-10-CM | POA: Diagnosis not present

## 2019-06-03 DIAGNOSIS — M47812 Spondylosis without myelopathy or radiculopathy, cervical region: Secondary | ICD-10-CM | POA: Diagnosis not present

## 2019-06-03 DIAGNOSIS — M67912 Unspecified disorder of synovium and tendon, left shoulder: Secondary | ICD-10-CM | POA: Diagnosis not present

## 2019-06-03 DIAGNOSIS — M5136 Other intervertebral disc degeneration, lumbar region: Secondary | ICD-10-CM | POA: Diagnosis not present

## 2019-06-03 NOTE — Progress Notes (Signed)
Office Visit Note   Patient: Jodi Anderson           Date of Birth: [birth year]           MRN: [id number] Visit Date: 06/03/2019              Requested by: Jodi Grana, FNP [address] [place],  [place] [address] PCP: Jodi Grana, FNP   Assessment & Plan: Visit Diagnoses:  1. Spondylosis without myelopathy or radiculopathy, cervical region   2. Subacromial bursitis of left shoulder joint   3. Other intervertebral disc degeneration, lumbar region   4. Tendinopathy of left rotator cuff     Plan: Patient can continue normal activity we reviewed MRI scan lumbar, cervical MRI scan as well as plain radiographs of her shoulder.  She can return if she has increased symptoms.  Follow-Up Instructions: No follow-ups on file.   Orders:  No orders of the defined types were placed in this encounter.  No orders of the defined types were placed in this encounter.     Procedures: No procedures performed   Clinical Data: No additional findings.   Subjective: Chief Complaint  Patient presents with  . Neck - Follow-up    HPI 59 year old female returns.  States she had MRI cervical which was canceled on 05/25/2019.  She had an injection by Dr. Karin Anderson lumbar spine with good relief which was left L5-S1 level.  She states she is back to normal activity.  History of 2 or 3 injections/year for L5-S1 disc degeneration.  She has had cervical epidural injection in the past as well for cervical spondylosis with disc protrusion left side C5-6 .  She states currently the neck and shoulders not been giving her as much problems.  Review of Systems Foster insomnia rotator cuff tendinopathy cervical spondylosis with referred left shoulder pain.  L5-S1 disc degeneration otherwise negative as pertains HPI.   Objective: Vital Signs: BP 103/71 (BP Location: Left Arm, Patient Position: Sitting, Cuff Size: Normal)   Pulse 68   Ht 5\' 2"  (1.575 m)   Wt 121 lb (54.9 kg)   LMP  06/23/2014 (Approximate)   BMI 22.13 kg/m   Physical Exam Constitutional:      Appearance: She is well-developed.  HENT:     Head: Normocephalic.     Right Ear: External ear normal.     Left Ear: External ear normal.  Eyes:     Pupils: Pupils are equal, round, and reactive to light.  Neck:     Thyroid: No thyromegaly.     Trachea: No tracheal deviation.  Cardiovascular:     Rate and Rhythm: Normal rate.  Pulmonary:     Effort: Pulmonary effort is normal.  Abdominal:     Palpations: Abdomen is soft.  Skin:    General: Skin is warm and dry.  Neurological:     Mental Status: She is alert and oriented to person, place, and time.  Psychiatric:        Behavior: Behavior normal.     Ortho Exam patient some brachial plexus tenderness on the left angle in the right upper semireflexes are 2+ mild impingement negative drop arm test long of the biceps is normal.  Normal heel toe gait.  Negative logroll to the hips.  No lower extremity atrophy. Specialty Comments:  No specialty comments available.  Imaging: No results found.   PMFS History: Patient Active Problem List   Diagnosis Date Noted  . B12 deficiency  09/19/2018  . SOB (shortness of breath) on exertion 09/17/2018  . Spondylosis without myelopathy or radiculopathy, cervical region 02/12/2018  . Other intervertebral disc degeneration, lumbar region 02/12/2018  . Tendinopathy of left rotator cuff 10/30/2017  . Medication refill 10/30/2017  . Subacromial bursitis of left shoulder joint 10/30/2017  . Premature beats 04/06/2016  . Routine general medical examination at a health care facility 12/10/2013  . Palpitations 04/14/2013  . Insomnia 04/14/2013   History reviewed. No pertinent past medical history.  Family History  Problem Relation Age of Onset  . Epilepsy Son   . Cancer Paternal Grandmother        breast  . Cancer Maternal Aunt        lung    Past Surgical History:  Procedure Laterality Date  . CESAREAN  SECTION     3x  . tummy tuck    . VAGINAL DELIVERY     Social History   Occupational History  . Not on file  Tobacco Use  . Smoking status: Never Smoker  . Smokeless tobacco: Never Used  Substance and Sexual Activity  . Alcohol use: Yes  . Drug use: No  . Sexual activity: Not on file

## 2019-06-22 ENCOUNTER — Other Ambulatory Visit: Payer: Self-pay | Admitting: Family

## 2019-06-22 DIAGNOSIS — Z760 Encounter for issue of repeat prescription: Secondary | ICD-10-CM

## 2019-06-22 DIAGNOSIS — G4700 Insomnia, unspecified: Secondary | ICD-10-CM

## 2019-06-23 NOTE — Telephone Encounter (Signed)
Call pt Please sch f/u appt for refill of ambien as it has been 9 months. Remind her she has to be seen every 3 months for refill  I have declined refill at this time

## 2019-06-23 NOTE — Telephone Encounter (Signed)
Patient stated that she thought that she had one refill. I advised her that pharmacy is the one who sent refill request. She said that she would check with pharmacy & call back to schedule an appointment. She is out of town right now.

## 2019-07-20 ENCOUNTER — Telehealth: Payer: Self-pay

## 2019-07-20 DIAGNOSIS — M5136 Other intervertebral disc degeneration, lumbar region: Secondary | ICD-10-CM

## 2019-07-20 NOTE — Telephone Encounter (Signed)
Order entered. Patient aware. 

## 2019-07-20 NOTE — Addendum Note (Signed)
Addended by: Rogers Seeds on: 07/20/2019 02:12 PM   Modules accepted: Orders

## 2019-07-20 NOTE — Telephone Encounter (Signed)
OK - thanks

## 2019-07-20 NOTE — Telephone Encounter (Signed)
Ok for referral?

## 2019-07-20 NOTE — Telephone Encounter (Signed)
patient called in wanting to get back injection sent to [facility] from dr yates.

## 2019-07-30 ENCOUNTER — Ambulatory Visit
Admission: RE | Admit: 2019-07-30 | Discharge: 2019-07-30 | Disposition: A | Discharge status: Home / Self Care | Payer: 59 | Source: Ambulatory Visit | Attending: Orthopaedic Surgery | Admitting: Orthopaedic Surgery

## 2019-07-30 ENCOUNTER — Other Ambulatory Visit: Payer: Self-pay

## 2019-07-30 DIAGNOSIS — M5136 Other intervertebral disc degeneration, lumbar region: Secondary | ICD-10-CM

## 2019-07-30 MED ORDER — IOPAMIDOL (ISOVUE-M 200) INJECTION 41%
1.0000 mL | Freq: Once | INTRAMUSCULAR | Status: AC
  Administered 2019-07-30: 1 mL via EPIDURAL

## 2019-07-30 MED ORDER — METHYLPREDNISOLONE ACETATE 40 MG/ML INJ SUSP (RADIOLOG
120.0000 mg | Freq: Once | INTRAMUSCULAR | Status: AC
  Administered 2019-07-30: 120 mg via EPIDURAL

## 2019-08-18 ENCOUNTER — Telehealth: Payer: Self-pay | Admitting: Family

## 2019-08-18 DIAGNOSIS — G4700 Insomnia, unspecified: Secondary | ICD-10-CM

## 2019-08-18 NOTE — Telephone Encounter (Signed)
Pt needs a refill on zolpidem (AMBIEN) 10 MG tablet. Pt has appt scheduled in August

## 2019-08-19 MED ORDER — ZOLPIDEM TARTRATE 10 MG PO TABS: 10.0000 mg | ORAL_TABLET | Freq: Every evening | ORAL | 1 refills | Status: DC | PRN

## 2019-08-19 NOTE — Telephone Encounter (Signed)
I looked up patient on [place] Controlled Substances Reporting System PMP AWARE and saw no activity that raised concern of inappropriate use.   

## 2019-09-21 ENCOUNTER — Encounter: Payer: Self-pay | Admitting: Family

## 2019-09-21 ENCOUNTER — Telehealth (INDEPENDENT_AMBULATORY_CARE_PROVIDER_SITE_OTHER): Payer: 59 | Admitting: Family

## 2019-09-21 VITALS — Ht 62.0 in | Wt 120.0 lb

## 2019-09-21 DIAGNOSIS — R002 Palpitations: Secondary | ICD-10-CM | POA: Diagnosis not present

## 2019-09-21 DIAGNOSIS — G4700 Insomnia, unspecified: Secondary | ICD-10-CM

## 2019-09-21 DIAGNOSIS — Z1231 Encounter for screening mammogram for malignant neoplasm of breast: Secondary | ICD-10-CM | POA: Diagnosis not present

## 2019-09-21 NOTE — Patient Instructions (Signed)
Nice to you !

## 2019-09-21 NOTE — Assessment & Plan Note (Signed)
No recurrence. Advised to continue to monitor and let me know if this were to change.

## 2019-09-21 NOTE — Assessment & Plan Note (Signed)
Stable. Controlled. Continue ambien.

## 2019-09-21 NOTE — Progress Notes (Signed)
Virtual Visit via Video Note  I connected with@  on 09/21/19 at  4:00 PM EDT by a video enabled telemedicine application and verified that I am speaking with the correct person using two identifiers.  Location patient: home Location provider:work  Persons participating in the virtual visit: patient, provider  I discussed the limitations of evaluation and management by telemedicine and the availability of in person appointments. The patient expressed understanding and agreed to proceed. Interactive audio and video telecommunications were attempted between this provider and patient, however failed, due to patient having technical difficulties or patient did not have access to video capability.  We continued and completed visit with audio only.     HPI: Doing well today No complaints. Feels well.   Insomnia- doing well on ambien 1/2 tablet qhs. No falls, strange dreams.   No further palpitations. No cp, sob.   ROS: See pertinent positives and negatives per HPI.  History reviewed. No pertinent past medical history.  Past Surgical History:  Procedure Laterality Date  . CESAREAN SECTION     3x  . tummy tuck    . VAGINAL DELIVERY      Family History  Problem Relation Age of Onset  . Epilepsy Son   . Cancer Paternal Grandmother        breast  . Cancer Maternal Aunt        lung      Current Outpatient Medications:  .  Diclofenac-miSOPROStol 75-0.2 MG TBEC, Take 1 tablet by mouth 2 (two) times daily as needed., Disp: , Rfl:  .  zolpidem (AMBIEN) 10 MG tablet, Take 1 tablet (10 mg total) by mouth at bedtime as needed. for sleep, Disp: 30 tablet, Rfl: 1  EXAM:    PSYCH/NEURO: pleasant and cooperative, no obvious depression or anxiety, speech and thought processing grossly intact  ASSESSMENT AND PLAN:  Discussed the following assessment and plan:  Encounter for screening mammogram for malignant neoplasm of breast - Plan: MM 3D SCREEN BREAST  BILATERAL  Palpitations  Insomnia, unspecified type Problem List Items Addressed This Visit      Other   Insomnia    Stable. Controlled. Continue ambien.       Palpitations    No recurrence. Advised to continue to monitor and let me know if this were to change.       Other Visit Diagnoses    Encounter for screening mammogram for malignant neoplasm of breast    -  Primary   Relevant Orders   MM 3D SCREEN BREAST BILATERAL    patient understands to schedule mammogram  -we discussed possible serious and likely etiologies, options for evaluation and workup, limitations of telemedicine visit vs in person visit, treatment, treatment risks and precautions. Pt prefers to treat via telemedicine empirically rather then risking or undertaking an in person visit at this moment.    I discussed the assessment and treatment plan with the patient. The patient was provided an opportunity to ask questions and all were answered. The patient agreed with the plan and demonstrated an understanding of the instructions.   The patient was advised to call back or seek an in-person evaluation if the symptoms worsen or if the condition fails to improve as anticipated.   Rennie Plowman, FNP  I have spent 15 minutes with a patient including precharting, exam, reviewing medical records, and discussion plan of care.

## 2019-10-12 ENCOUNTER — Telehealth: Payer: Self-pay | Admitting: Orthopaedic Surgery

## 2019-10-12 NOTE — Telephone Encounter (Signed)
I called patient and advised.  Appt made.

## 2019-10-12 NOTE — Telephone Encounter (Signed)
Please advise 

## 2019-10-12 NOTE — Telephone Encounter (Signed)
ROV for recheck and review thanks

## 2019-10-12 NOTE — Telephone Encounter (Signed)
Patient called  Jodi Anderson she's had several injections but the pain is persistent. She would like to a call back to determine if she should get another injection or consider surgery  Call back: [phone]

## 2019-10-13 ENCOUNTER — Other Ambulatory Visit: Payer: Self-pay | Admitting: Family

## 2019-10-13 ENCOUNTER — Ambulatory Visit: Payer: 59 | Admitting: Orthopaedic Surgery

## 2019-10-13 DIAGNOSIS — G4700 Insomnia, unspecified: Secondary | ICD-10-CM

## 2019-10-14 ENCOUNTER — Encounter: Payer: Self-pay | Admitting: Family

## 2019-10-20 ENCOUNTER — Ambulatory Visit: Payer: 59 | Admitting: Orthopaedic Surgery

## 2019-10-20 ENCOUNTER — Encounter: Payer: Self-pay | Admitting: Orthopaedic Surgery

## 2019-10-20 VITALS — Ht 62.0 in | Wt 121.0 lb

## 2019-10-20 DIAGNOSIS — Z780 Asymptomatic menopausal state: Secondary | ICD-10-CM

## 2019-10-20 DIAGNOSIS — M5136 Other intervertebral disc degeneration, lumbar region: Secondary | ICD-10-CM | POA: Diagnosis not present

## 2019-10-21 ENCOUNTER — Telehealth: Payer: Self-pay | Admitting: *Deleted

## 2019-10-21 NOTE — Progress Notes (Signed)
Office Visit Note   Patient: Jodi Anderson           Date of Birth: 1960-02-10           MRN: [id number] Visit Date: 10/20/2019              Requested by: Allegra Grana, FNP [address] [place],  [place] [address] PCP: Allegra Grana, FNP   Assessment & Plan: Visit Diagnoses:  1. Other intervertebral disc degeneration, lumbar region   2. Postmenopausal     Plan: Patient has advanced disc degeneration L5-S1 with at least moderate neuroforaminal stenosis bilaterally. All other levels above did not show significant pathology. MRI scan was 2018 and her symptoms have progressed. We'll proceed with a new MRI scan. She has been through physical therapy anti-inflammatories at least 6 epidural injections without relief now with increased symptoms. Patient had multiple epidural injections and not had a bone density test. Will obtain one for screening osteoporosis.  Follow-Up Instructions: Return after MRI scan lumbar.  Orders:  Orders Placed This Encounter  Procedures  . MR Lumbar Spine w/o contrast  . DG BONE DENSITY (DXA)   No orders of the defined types were placed in this encounter.     Procedures: No procedures performed   Clinical Data: No additional findings.   Subjective: Chief Complaint  Patient presents with  . Neck - Follow-up  . Lower Back - Follow-up    HPI 59 year old female returns she states her back is giving her more problems with left lateral calf pain that radiates down to her foot primarily over the dorsum. She has had at least 6 epidural injections. Past MRI scan showed moderate to severe foraminal stenosis left greater than right. She states her neck symptoms with spondylosis have been doing somewhat better and is calm down. She states is gotten the point where she not able to handle the back and leg symptoms and is ready to discuss surgical options. Last MRI scan lumbar was 02/23/2016 which was reviewed at today's visit.  Review of  Systems   Objective: Vital Signs: Ht 5\' 2"  (1.575 m)   Wt 121 lb (54.9 kg)   LMP 06/23/2014 (Approximate)   BMI 22.13 kg/m   Physical Exam Constitutional:      Appearance: She is well-developed.  HENT:     Head: Normocephalic.     Right Ear: External ear normal.     Left Ear: External ear normal.  Eyes:     Pupils: Pupils are equal, round, and reactive to light.  Neck:     Thyroid: No thyromegaly.     Trachea: No tracheal deviation.  Cardiovascular:     Rate and Rhythm: Normal rate.  Pulmonary:     Effort: Pulmonary effort is normal.  Abdominal:     Palpations: Abdomen is soft.  Skin:    General: Skin is warm and dry.  Neurological:     Mental Status: She is alert and oriented to person, place, and time.  Psychiatric:        Behavior: Behavior normal.     Ortho Exam patient has intact knee and ankle jerk pain with straight leg raising at 80 degrees on the left negative on the right. Sciatic notch tenderness left slightly worse than right no trochanteric bursal tenderness negative logroll to the hips. She is able to heel and toe walk. Decreased sensation lateral left calf extends to the dorsum of the ankle.  Specialty Comments:  No specialty  comments available.  Imaging: No results found.   PMFS History: Patient Active Problem List   Diagnosis Date Noted  . B12 deficiency 09/19/2018  . SOB (shortness of breath) on exertion 09/17/2018  . Spondylosis without myelopathy or radiculopathy, cervical region 02/12/2018  . Other intervertebral disc degeneration, lumbar region 02/12/2018  . Tendinopathy of left rotator cuff 10/30/2017  . Medication refill 10/30/2017  . Subacromial bursitis of left shoulder joint 10/30/2017  . Premature beats 04/06/2016  . Routine general medical examination at a health care facility 12/10/2013  . Palpitations 04/14/2013  . Insomnia 04/14/2013   No past medical history on file.  Family History  Problem Relation Age of Onset  .  Epilepsy Son   . Cancer Paternal Grandmother        breast  . Cancer Maternal Aunt        lung    Past Surgical History:  Procedure Laterality Date  . CESAREAN SECTION     3x  . tummy tuck    . VAGINAL DELIVERY     Social History   Occupational History  . Not on file  Tobacco Use  . Smoking status: Never Smoker  . Smokeless tobacco: Never Used  Vaping Use  . Vaping Use: Never used  Substance and Sexual Activity  . Alcohol use: Yes  . Drug use: No  . Sexual activity: Not on file

## 2019-10-21 NOTE — Telephone Encounter (Signed)
Pt was in yesterday and was ordered a MRI lumbar spine and now wants to know if can also get MRI of neck too in callse she needs to get another injection Dr. Arnetha Gula will need a updated MRI of CSP.

## 2019-10-21 NOTE — Telephone Encounter (Signed)
I called just do lumbar.

## 2019-10-21 NOTE — Telephone Encounter (Signed)
Please advise 

## 2019-10-26 ENCOUNTER — Ambulatory Visit: Payer: 59 | Attending: Internal Medicine

## 2019-10-26 DIAGNOSIS — Z23 Encounter for immunization: Secondary | ICD-10-CM

## 2019-10-26 NOTE — Progress Notes (Signed)
   Covid-19 Vaccination Clinic  Name:  Jodi Anderson    MRN: [id number] DOB: October 22, 1960  10/26/2019  [name] was observed post Covid-19 immunization for 15 minutes without incident. She was provided with Vaccine Information Sheet and instruction to access the V-Safe system.   [name] was instructed to call 911 with any severe reactions post vaccine: Marland Kitchen Difficulty breathing  . Swelling of face and throat  . A fast heartbeat  . A bad rash all over body  . Dizziness and weakness

## 2019-11-10 ENCOUNTER — Other Ambulatory Visit: Payer: 59

## 2019-11-24 ENCOUNTER — Other Ambulatory Visit: Payer: 59

## 2019-12-07 ENCOUNTER — Other Ambulatory Visit: Payer: Self-pay

## 2019-12-07 DIAGNOSIS — G4700 Insomnia, unspecified: Secondary | ICD-10-CM

## 2019-12-07 NOTE — Telephone Encounter (Signed)
Refilled: 10/15/2019 Last OV: 09/21/2019 Next OV: 03/21/2020

## 2019-12-08 MED ORDER — ZOLPIDEM TARTRATE 10 MG PO TABS: 10.0000 mg | ORAL_TABLET | Freq: Every evening | ORAL | 1 refills | Status: DC | PRN

## 2019-12-08 NOTE — Telephone Encounter (Signed)
Call pt   I have refilled your ambien  However I wanted to remind you that this is controlled substance.   In order for me to prescribe medication,  patients must be seen every 3 months.   Please make follow-up appointment this month for any further refills.    I looked up patient on  Controlled Substances Reporting System and saw no activity that raised concern of inappropriate use.    

## 2019-12-15 NOTE — Telephone Encounter (Signed)
Pt has appointment 03/21/20.

## 2020-01-13 ENCOUNTER — Telehealth: Payer: Self-pay | Admitting: Family

## 2020-01-13 NOTE — Telephone Encounter (Signed)
Pt stated that she tested positive yesterday for Covid. Symptoms started on Sunday. So far it is mild with cough & stuffy nose. She feels like she has a cold. No appointments available & patient wanted to know if she should be referred for MAI. I see no co-morbidities in her chart.& we have no VV available for her to be seen in office. I did advise recommenced vitamins & OTC meds to manage symptoms. She wanted to get your take on being referred?

## 2020-01-13 NOTE — Telephone Encounter (Signed)
Call pt  Sorry she is positive , yes optimize vitamins per below. You may review info with her below if needed.   Per our new update, as she is vaccinated, she may not be able to get MAI due to low resources at this time and closed Friday- Sunday.   HOWEVER, we will call and leave a message at [facility] as we normally do and operate under the impression that she may still be able to get. Please leave message at [facility]   If any sob , advise ED.  Covid testing in [facility]. I recommend anyone that has had close contact with you to quarantine immediately and be tested with PCR covid test for most accurate test.    [email or web address]  We have called [facility] in [place] regarding monoclonal antibodies infusion as we discussed;please expect a phone call from them in the next day or  Two, if not please let me know asap as you have narrow window to get this infusion. You have to receive with 10 days of symptom onset.     Purchase pulse oximeter to monitor oxygenation saturation. If you oxygen were to drop < 90%, this is an emergency and you may need oxygen support. You would need to seek in person evaluation at the emergency room or call 911.   Please start Mucinex DM to take at bedtime for cough.     Please begin Vit C 1000 mg daily   Vitamin D3 4000 IU daily   Zinc 50 mg daily   Quercetin 250 mg -500 mg twice daily ; this an antioxidant which reduces inflammation. You can find it at places such as [employer] and [employer] however not limited to these stores.   Once you are better, you may STOP all the above supplements.   Rest, hydrate very well, eat healthy protein food, Tylenol or Advil as directed .    Please go to Acute Care if symptoms worsen but are not an emergency. Office video visit again early next week.    Remain in self quarantine until at least 10 days since symptom onset AND 3 consecutive days fever free  without antipyretics AND improvement in respiratory symptoms.    Utilize over the counter medications to treat symptoms.   Seek  treatment in the ED if respiratory issues/distress develops or unrelieved chest pain. Or, if you become severely weak, dehydrated, confused.    Only leave home to seek medical care and must wear a mask in public. Limit contact with family members or caregivers in the home and to notify her family who she was with yesterday that they should quarantine for 14 days. Practice social distancing and to continue to use good preventative care measures such has frequent hand washing.      Your COVID-19 test was resulted as positive.    You should continue your self imposed quantarine until you can answer "yes" to ALL 3 of the conditions below:   1) Your symptoms (fever, cough, shortness of breath) started 7 or more days ago   2) Your body temperature  has been normal for at least 72 hours (WITHOUT the use of any tylenol, motrin or aleve) . Normal is < 100.4 Farenheit  3) your other flu  like symptoms symptoms are getting better.   YOUR FAMILY or other household contacts, however , even if they feel fine , will need to continue to quarantining themselves  (that means no contact with ANYONE outside of the house)  for 14 days STARTING from the end of your initial 7 day period . (why? because they have been theoretically exposed to you during the entire 7 days of your illness, and they can sheD the virus without symptoms for that period of time ).         10 Things You Can Do to Manage Your COVID-19 Symptoms at Home If you have possible or confirmed COVID-19: 1. Stay home from work and school. And stay away from other public places. If you must go out, avoid using any kind of public transportation, ridesharing, or taxis. 2. Monitor your symptoms carefully. If your symptoms get worse, call your healthcare provider immediately. 3. Get rest and stay hydrated. 4. If you have a  medical appointment, call the healthcare provider ahead of time and tell them that you have or may have COVID-19. 5. For medical emergencies, call 911 and notify the dispatch personnel that you have or may have COVID-19. 6. Cover your cough and sneezes with a tissue or use the inside of your elbow. 7. Wash your hands often with soap and water for at least 20 seconds or clean your hands with an alcohol-based hand sanitizer that contains at least 60% alcohol. 8. As much as possible, stay in a specific room and away from other people in your home. Also, you should use a separate bathroom, if available. If you need to be around other people in or outside of the home, wear a mask. 9. Avoid sharing personal items with other people in your household, like dishes, towels, and bedding. 10. Clean all surfaces that are touched often, like counters, tabletops, and doorknobs. Use household cleaning sprays or wipes according to the label instructions. [email or web address] 07/23/2018

## 2020-01-13 NOTE — Telephone Encounter (Signed)
Patient called in stated that she has tested positive for covid wanted to know what she needs to do now

## 2020-01-13 NOTE — Telephone Encounter (Signed)
I called and let patient know that she did not qualify for MAI, but I would call to refer her just in case. I gave instructions to manage symptoms & instructed urgent care or ED if oxygen saturation before 90% or any worsening symptoms.

## 2020-01-14 ENCOUNTER — Telehealth: Payer: Self-pay

## 2020-01-14 ENCOUNTER — Encounter: Payer: Self-pay | Admitting: Nurse Practitioner

## 2020-01-14 NOTE — Telephone Encounter (Signed)
Pt called and stated that she was starting to feel worse & cough with being Covid positive. She was nervous & anxious due her being alone over the weekend. I advised that I had referred her for MAI, but that she really did not meet current qualifications. I advised that she go to UC to at least have her STATS checked & see what her O2 was at. She may need breathing treatment or chest xray I just did not know. Pt asked why we were unable to get patients the treatment now & I advised that we were just told that the supplies were at a shortage due to surge in cases. Pt wanted to know what dx ws in her chart from cardiology, so that she could tell her friend Dr. Jenne Campus so maybe he had connections & could get her an appointment for infusion.

## 2020-02-10 ENCOUNTER — Other Ambulatory Visit (HOSPITAL_COMMUNITY)
Admission: RE | Admit: 2020-02-10 | Discharge: 2020-02-10 | Disposition: A | Discharge status: Home / Self Care | Payer: 59 | Source: Ambulatory Visit | Attending: Family | Admitting: Family

## 2020-02-10 ENCOUNTER — Encounter: Payer: Self-pay | Admitting: Family

## 2020-02-10 ENCOUNTER — Other Ambulatory Visit: Payer: Self-pay

## 2020-02-10 ENCOUNTER — Ambulatory Visit (INDEPENDENT_AMBULATORY_CARE_PROVIDER_SITE_OTHER): Payer: 59 | Admitting: Family

## 2020-02-10 VITALS — BP 98/70 | HR 85 | Temp 98.6°F | Ht 62.01 in | Wt 127.4 lb

## 2020-02-10 DIAGNOSIS — Z23 Encounter for immunization: Secondary | ICD-10-CM

## 2020-02-10 DIAGNOSIS — Z0000 Encounter for general adult medical examination without abnormal findings: Secondary | ICD-10-CM

## 2020-02-10 DIAGNOSIS — L509 Urticaria, unspecified: Secondary | ICD-10-CM | POA: Diagnosis not present

## 2020-02-10 DIAGNOSIS — G4700 Insomnia, unspecified: Secondary | ICD-10-CM | POA: Diagnosis not present

## 2020-02-10 MED ORDER — FAMOTIDINE 20 MG PO TABS: 20.0000 mg | ORAL_TABLET | Freq: Two times a day (BID) | ORAL | 1 refills | Status: DC

## 2020-02-10 NOTE — Patient Instructions (Addendum)
For hives, lets do a trial of pepcid ac WITH allegra for a couple of weeks and see if you notice a decrease.       Tips on Aggravating factors --    These include:  ?Physical factors -  As an example, heat (hot showers, extreme humidity) is a common trigger for many , and tight clothing or straps can also aggravate symptoms.   ?Anti-inflammatory medications - Nonsteroidal anti-inflammatory drugs (NSAIDs) worsen symptoms   ?Stress - Patients often report more severe symptoms during periods of physical or psychologic stress   ?Variations in dietary habits and alcohol - Although food allergy is a rare cause of, some patients will report that variations in diet, particularly rich meals or spicy foods, will aggravate symptoms. Alcohol also aggravates symptoms in some.  If hives do not improve, please let me know and remember :   Histamine 1 blocker - Either benadryl every 8 hours (may be different dosing/duration depending on formulation, please follow directions on bottle)                                         OR  claritin or zyrtec once daily  Histamine 2 blocker- Pepcid AC or Zantac twice a day  Health Maintenance, Female Adopting a healthy lifestyle and getting preventive care are important in promoting health and wellness. Ask your health care provider about:  The right schedule for you to have regular tests and exams.  Things you can do on your own to prevent diseases and keep yourself healthy. What should I know about diet, weight, and exercise? Eat a healthy diet  Eat a diet that includes plenty of vegetables, fruits, low-fat dairy products, and lean protein.  Do not eat a lot of foods that are high in solid fats, added sugars, or sodium.   Maintain a healthy weight Body mass index (BMI) is used to identify weight problems. It estimates body fat based on height and weight. Your health care provider can help determine your BMI and help you achieve or maintain a healthy  weight. Get regular exercise Get regular exercise. This is one of the most important things you can do for your health. Most adults should:  Exercise for at least 150 minutes each week. The exercise should increase your heart rate and make you sweat (moderate-intensity exercise).  Do strengthening exercises at least twice a week. This is in addition to the moderate-intensity exercise.  Spend less time sitting. Even light physical activity can be beneficial. Watch cholesterol and blood lipids Have your blood tested for lipids and cholesterol at 59 years of age, then have this test every 5 years. Have your cholesterol levels checked more often if:  Your lipid or cholesterol levels are high.  You are older than 59 years of age.  You are at high risk for heart disease. What should I know about cancer screening? Depending on your health history and family history, you may need to have cancer screening at various ages. This may include screening for:  Breast cancer.  Cervical cancer.  Colorectal cancer.  Skin cancer.  Lung cancer. What should I know about heart disease, diabetes, and high blood pressure? Blood pressure and heart disease  High blood pressure causes heart disease and increases the risk of stroke. This is more likely to develop in people who have high blood pressure readings, are of African descent, or  are overweight.  Have your blood pressure checked: ? Every 3-5 years if you are 61-35 years of age. ? Every year if you are 40 years old or older. Diabetes Have regular diabetes screenings. This checks your fasting blood sugar level. Have the screening done:  Once every three years after age 45 if you are at a normal weight and have a low risk for diabetes.  More often and at a younger age if you are overweight or have a high risk for diabetes. What should I know about preventing infection? Hepatitis B If you have a higher risk for hepatitis B, you should be  screened for this virus. Talk with your health care provider to find out if you are at risk for hepatitis B infection. Hepatitis C Testing is recommended for:  Everyone born from [birth year] through 1964.  Anyone with known risk factors for hepatitis C. Sexually transmitted infections (STIs)  Get screened for STIs, including gonorrhea and chlamydia, if: ? You are sexually active and are younger than 59 years of age. ? You are older than 59 years of age and your health care provider tells you that you are at risk for this type of infection. ? Your sexual activity has changed since you were last screened, and you are at increased risk for chlamydia or gonorrhea. Ask your health care provider if you are at risk.  Ask your health care provider about whether you are at high risk for HIV. Your health care provider may recommend a prescription medicine to help prevent HIV infection. If you choose to take medicine to prevent HIV, you should first get tested for HIV. You should then be tested every 3 months for as long as you are taking the medicine. Pregnancy  If you are about to stop having your period (premenopausal) and you may become pregnant, seek counseling before you get pregnant.  Take 400 to 800 micrograms (mcg) of folic acid every day if you become pregnant.  Ask for birth control (contraception) if you want to prevent pregnancy. Osteoporosis and menopause Osteoporosis is a disease in which the bones lose minerals and strength with aging. This can result in bone fractures. If you are 17 years old or older, or if you are at risk for osteoporosis and fractures, ask your health care provider if you should:  Be screened for bone loss.  Take a calcium or vitamin D supplement to lower your risk of fractures.  Be given hormone replacement therapy (HRT) to treat symptoms of menopause. Follow these instructions at home: Lifestyle  Do not use any products that contain nicotine or tobacco, such as  cigarettes, e-cigarettes, and chewing tobacco. If you need help quitting, ask your health care provider.  Do not use street drugs.  Do not share needles.  Ask your health care provider for help if you need support or information about quitting drugs. Alcohol use  Do not drink alcohol if: ? Your health care provider tells you not to drink. ? You are pregnant, may be pregnant, or are planning to become pregnant.  If you drink alcohol: ? Limit how much you use to 0-1 drink a day. ? Limit intake if you are breastfeeding.  Be aware of how much alcohol is in your drink. In the [place] one drink equals one 12 oz bottle of beer (355 mL), one 5 oz glass of wine (148 mL), or one 1 oz glass of hard liquor (44 mL). General instructions  Schedule regular health, dental, and eye  exams.  Stay current with your vaccines.  Tell your health care provider if: ? You often feel depressed. ? You have ever been abused or do not feel safe at home. Summary  Adopting a healthy lifestyle and getting preventive care are important in promoting health and wellness.  Follow your health care provider's instructions about healthy diet, exercising, and getting tested or screened for diseases.  Follow your health care provider's instructions on monitoring your cholesterol and blood pressure. This information is not intended to replace advice given to you by your health care provider. Make sure you discuss any questions you have with your health care provider. Document Revised: 01/01/2018 Document Reviewed: 01/01/2018 Elsevier Patient Education  2021 [employer].

## 2020-02-10 NOTE — Progress Notes (Signed)
Subjective:    Patient ID: Jodi Anderson, female    DOB: [birth year], 60 y.o.   MRN: [id number]  CC: Jodi Anderson is a 60 y.o. female who presents today for physical exam and follow up.    HPI: Complains of intermittent hives for past 5 years, comes and go. No hives today. First presented when under a lot of stress. Resolves on their own in 5 days typically.  Over the past month, hives have been more bothersome, persistent. Recently had covid 19 and endorses stress as family and children got as well.   Hot water helps with itch.  Very itchy. Hives are not worse with temperature changes.  Hives present face, chest. Red blotchy like. No hives today.  NO new lotons, creams, detergents, new medications. Rare alcohol.  She does take diclofenac 75mg  BID prn.  No h/o seasonal allergies.   Compliant with allegra with some relief of hives.    Follows with Dr .   Planning on plastic surgery for bilateral knee lifts in approx 4 months time in [place]. She would like baseline EKG and labs for this.   Recent covid 19 approx 4 weeks ago. No sob,fever. Continues to have cough, throat clearing although improved from last week. Walking daily 4-8 miles.   Insomnia- doing well on 10mg  ambien.     Colorectal Cancer Screening: UTD . States 10 year repeat. Unable to see report from [facility].  Breast Cancer Screening: Mammogram scheduled 02/26/20 Cervical Cancer Screening: Due; atrophy seen 2019.           Tetanus - UTD        Labs: Screening labs today. Exercise: Gets regular exercise.   Alcohol use:  rare Smoking/tobacco use: Nonsmoker.     HISTORY:  History reviewed. No pertinent past medical history.  Past Surgical History:  Procedure Laterality Date  . CESAREAN SECTION     3x  . tummy tuck    . VAGINAL DELIVERY     Family History  Problem Relation Age of Onset  . Epilepsy Son   . Cancer Paternal Grandmother        breast  . Cancer Maternal Aunt        lung       ALLERGIES: Codeine  Current Outpatient Medications on File Prior to Visit  Medication Sig Dispense Refill  . diclofenac (VOLTAREN) 75 MG EC tablet Take 75 mg by mouth 2 (two) times daily.    04/25/20 triamcinolone (KENALOG) 0.1 % Apply 1 application topically 2 (two) times daily.     No current facility-administered medications on file prior to visit.    Social History   Tobacco Use  . Smoking status: Never Smoker  . Smokeless tobacco: Never Used  Vaping Use  . Vaping Use: Never used  Substance Use Topics  . Alcohol use: Yes  . Drug use: No    Review of Systems  Constitutional: Negative for chills, fever and unexpected weight change.  HENT: Positive for congestion.   Respiratory: Negative for cough and shortness of breath.   Cardiovascular: Negative for chest pain, palpitations and leg swelling.  Gastrointestinal: Negative for nausea and vomiting.  Musculoskeletal: Negative for arthralgias and myalgias.  Skin: Positive for rash.  Neurological: Negative for headaches.  Hematological: Negative for adenopathy.  Psychiatric/Behavioral: Negative for confusion.      Objective:    BP 98/70 (BP Location: Left Arm, Patient Position: Sitting)   Pulse 85   Temp 98.6 F (37 C)  Ht 5' 2.01" (1.575 m)   Wt 127 lb 6.4 oz (57.8 kg)   LMP 06/23/2014 (Approximate)   SpO2 97%   BMI 23.30 kg/m   BP Readings from Last 3 Encounters:  02/10/20 98/70  07/30/19 98/70  06/03/19 103/71   Wt Readings from Last 3 Encounters:  02/10/20 127 lb 6.4 oz (57.8 kg)  10/20/19 121 lb (54.9 kg)  09/21/19 120 lb (54.4 kg)    Physical Exam Vitals reviewed.  Constitutional:      Appearance: She is well-developed and well-nourished.  Eyes:     Conjunctiva/sclera: Conjunctivae normal.  Neck:     Thyroid: No thyroid mass or thyromegaly.  Cardiovascular:     Rate and Rhythm: Normal rate and regular rhythm.     Pulses: Normal pulses.     Heart sounds: Normal heart sounds.  Pulmonary:      Effort: Pulmonary effort is normal.     Breath sounds: Normal breath sounds. No wheezing, rhonchi or rales.     Comments: No masses or asymmetry appreciated during CBE. Chest:  Breasts: Breasts are symmetrical.     Right: No inverted nipple, mass, nipple discharge, skin change or tenderness.     Left: No inverted nipple, mass, nipple discharge, skin change or tenderness.    Genitourinary:    Cervix: No cervical motion tenderness, discharge or friability.     Uterus: Not enlarged, not fixed and not tender.      Adnexa:        Right: No mass, tenderness or fullness.         Left: No mass, tenderness or fullness.       Comments: Pap performed. Stenotic os.  No CMT. Unable to appreciated ovaries. Lymphadenopathy:     Head:     Right side of head: No submental, submandibular, tonsillar, preauricular, posterior auricular or occipital adenopathy.     Left side of head: No submental, submandibular, tonsillar, preauricular, posterior auricular or occipital adenopathy.     Cervical:     Right cervical: No superficial, deep or posterior cervical adenopathy.    Left cervical: No superficial, deep or posterior cervical adenopathy.     Upper Body:  No axillary adenopathy present.    Right upper body: No pectoral or lateral adenopathy.     Left upper body: No pectoral or lateral adenopathy.  Skin:    General: Skin is warm and dry.  Neurological:     Mental Status: She is alert.  Psychiatric:        Mood and Affect: Mood and affect normal.        Speech: Speech normal.        Behavior: Behavior normal.        Thought Content: Thought content normal.        Assessment & Plan:   Problem List Items Addressed This Visit      Musculoskeletal and Integument   Hives    Acute on chronic. Hives with unknown etiology. No rash today.  Discussed various etiologies including NSAIDs, temperature change, anxiety. Trial of pepcid ac in addition to allegra for histamine blockade. She will let me know if  hives remain persistent. At follow up, may consider holding NSAIDs in their entirety as well to see if any effect.  She follows with dermatology however seeing an allergist may be reasonable as well. Will follow        Relevant Medications   famotidine (PEPCID) 20 MG tablet     Other  Insomnia    Stable. Continue ambien 10mg  prn.       Relevant Medications   zolpidem (AMBIEN) 10 MG tablet   Routine general medical examination at a health care facility - Primary (Chronic)    CBE and pap smear performed today. She has scheduled mammogram. Encouraged continued exercise. Baseline EKG performed for upcoming surgery ( approx May 2022) which revealed NSR, no ischemia, nor significant changes from prior EKG  10/2018 with cardiology.       Relevant Orders   EKG 12-Lead (Completed)   TSH (Completed)   CBC with Differential/Platelet (Completed)   Comprehensive metabolic panel (Completed)   Hemoglobin A1c (Completed)   Lipid panel (Completed)   VITAMIN D 25 Hydroxy (Vit-D Deficiency, Fractures) (Completed)   Cytology - PAP   Protime-INR (Completed)   APTT (Completed)    Other Visit Diagnoses    Need for immunization against influenza       Relevant Orders   Flu Vaccine QUAD 36+ mos IM (Completed)       I have discontinued 11/2018 R. Steers's Diclofenac-miSOPROStol and hydrOXYzine. I am also having her start on famotidine. Additionally, I am having her maintain her triamcinolone, diclofenac, and zolpidem.   Meds ordered this encounter  Medications  . famotidine (PEPCID) 20 MG tablet    Sig: Take 1 tablet (20 mg total) by mouth 2 (two) times daily.    Dispense:  60 tablet    Refill:  1    Order Specific Question:   Supervising Provider    Answer:   [name] L [[id number]]  . zolpidem (AMBIEN) 10 MG tablet    Sig: Take 1 tablet (10 mg total) by mouth at bedtime as needed. for sleep    Dispense:  30 tablet    Refill:  1    FOR NEXT TIME    Order Specific Question:   Supervising  Provider    Answer:   Duncan Dull [[id number]]    Return precautions given.   Risks, benefits, and alternatives of the medications and treatment plan prescribed today were discussed, and patient expressed understanding.   Education regarding symptom management and diagnosis given to patient on AVS.   Continue to follow with Sherlene Shams, FNP for routine health maintenance.   Allegra Grana and I agreed with plan.   Jodi Clinch, FNP

## 2020-02-11 ENCOUNTER — Telehealth: Payer: Self-pay

## 2020-02-11 LAB — CBC WITH DIFFERENTIAL/PLATELET
Basophils Absolute: 0 10*3/uL (ref 0.0–0.1)
Basophils Relative: 1.2 % (ref 0.0–3.0)
Eosinophils Absolute: 0.1 10*3/uL (ref 0.0–0.7)
Eosinophils Relative: 2.2 % (ref 0.0–5.0)
HCT: 41.1 % (ref 36.0–46.0)
Hemoglobin: 13.8 g/dL (ref 12.0–15.0)
Lymphocytes Relative: 33.6 % (ref 12.0–46.0)
Lymphs Abs: 1.2 10*3/uL (ref 0.7–4.0)
MCHC: 33.6 g/dL (ref 30.0–36.0)
MCV: 88.2 fl (ref 78.0–100.0)
Monocytes Absolute: 0.5 10*3/uL (ref 0.1–1.0)
Monocytes Relative: 15.2 % — ABNORMAL HIGH (ref 3.0–12.0)
Neutro Abs: 1.7 10*3/uL (ref 1.4–7.7)
Neutrophils Relative %: 47.8 % (ref 43.0–77.0)
Platelets: 229 10*3/uL (ref 150.0–400.0)
RBC: 4.66 Mil/uL (ref 3.87–5.11)
RDW: 12.7 % (ref 11.5–15.5)
WBC: 3.6 10*3/uL — ABNORMAL LOW (ref 4.0–10.5)

## 2020-02-11 LAB — COMPREHENSIVE METABOLIC PANEL
ALT: 18 U/L (ref 0–35)
AST: 20 U/L (ref 0–37)
Albumin: 4.7 g/dL (ref 3.5–5.2)
Alkaline Phosphatase: 64 U/L (ref 39–117)
BUN: 24 mg/dL — ABNORMAL HIGH (ref 6–23)
CO2: 29 mEq/L (ref 19–32)
Calcium: 9.7 mg/dL (ref 8.4–10.5)
Chloride: 102 mEq/L (ref 96–112)
Creatinine, Ser: 0.81 mg/dL (ref 0.40–1.20)
GFR: 79.72 mL/min (ref >60.00)
Glucose, Bld: 96 mg/dL (ref 70–99)
Potassium: 4.1 mEq/L (ref 3.5–5.1)
Sodium: 138 mEq/L (ref 135–145)
Total Bilirubin: 0.4 mg/dL (ref 0.2–1.2)
Total Protein: 6.8 g/dL (ref 6.0–8.3)

## 2020-02-11 LAB — LIPID PANEL
Cholesterol: 246 mg/dL — ABNORMAL HIGH (ref 0–200)
HDL: 84.6 mg/dL (ref >39.00)
LDL Cholesterol: 142 mg/dL — ABNORMAL HIGH (ref 0–99)
NonHDL: 161.31
Total CHOL/HDL Ratio: 3
Triglycerides: 95 mg/dL (ref 0.0–149.0)
VLDL: 19 mg/dL (ref 0.0–40.0)

## 2020-02-11 LAB — TSH: TSH: 1 u[IU]/mL (ref 0.35–4.50)

## 2020-02-11 LAB — PROTIME-INR
INR: 1 (ref 0.9–1.2)
Prothrombin Time: 10.3 s (ref 9.1–12.0)

## 2020-02-11 LAB — VITAMIN D 25 HYDROXY (VIT D DEFICIENCY, FRACTURES): VITD: 29.92 ng/mL — ABNORMAL LOW (ref 30.00–100.00)

## 2020-02-11 LAB — APTT: aPTT: 25 s (ref 24–33)

## 2020-02-11 LAB — HEMOGLOBIN A1C: Hgb A1c MFr Bld: 5.9 % (ref 4.6–6.5)

## 2020-02-11 MED ORDER — ZOLPIDEM TARTRATE 10 MG PO TABS: 10.0000 mg | ORAL_TABLET | Freq: Every evening | ORAL | 1 refills | Status: DC | PRN

## 2020-02-11 NOTE — Telephone Encounter (Signed)
Pt called about lab results. Please advise °

## 2020-02-11 NOTE — Assessment & Plan Note (Signed)
Stable. Continue ambien 10mg  prn.

## 2020-02-11 NOTE — Assessment & Plan Note (Deleted)
Stable. Continue ambien 5mg 

## 2020-02-11 NOTE — Assessment & Plan Note (Addendum)
CBE and pap smear performed today. She has scheduled mammogram. Encouraged continued exercise. Baseline EKG performed for upcoming surgery ( approx May 2022) which revealed NSR, no ischemia, nor significant changes from prior EKG  10/2018 with cardiology.

## 2020-02-11 NOTE — Assessment & Plan Note (Addendum)
Acute on chronic. Hives with unknown etiology. No rash today.  Discussed various etiologies including NSAIDs, temperature change, anxiety. Trial of pepcid ac in addition to allegra for histamine blockade. She will let me know if hives remain persistent. At follow up, may consider holding NSAIDs in their entirety as well to see if any effect.  She follows with dermatology however seeing an allergist may be reasonable as well. Will follow

## 2020-02-12 ENCOUNTER — Other Ambulatory Visit: Payer: Self-pay | Admitting: Family

## 2020-02-12 DIAGNOSIS — R899 Unspecified abnormal finding in specimens from other organs, systems and tissues: Secondary | ICD-10-CM

## 2020-02-12 NOTE — Telephone Encounter (Signed)
It appears that patient has viewed on mychart. LM stating these has not yet been resulted to me with any comments from [facility], but would call when they were.

## 2020-02-15 ENCOUNTER — Other Ambulatory Visit: Payer: Self-pay | Admitting: Family

## 2020-02-15 ENCOUNTER — Telehealth: Payer: Self-pay | Admitting: Orthopaedic Surgery

## 2020-02-15 DIAGNOSIS — M5136 Other intervertebral disc degeneration, lumbar region: Secondary | ICD-10-CM

## 2020-02-15 DIAGNOSIS — Z0000 Encounter for general adult medical examination without abnormal findings: Secondary | ICD-10-CM

## 2020-02-15 LAB — CYTOLOGY - PAP
Comment: NEGATIVE
Diagnosis: NEGATIVE
High risk HPV: NEGATIVE

## 2020-02-15 NOTE — Telephone Encounter (Signed)
Pt called asking to have an order for a back injection put in at [facility] and she also stated that her L elbow has been giving her constant pain and she would like to get a cortisone injection for her elbow when she goes for her back. She would like a CB to make sure this can be done.

## 2020-02-15 NOTE — Telephone Encounter (Signed)
Order entered for lumbar ESI with [facility].

## 2020-02-15 NOTE — Telephone Encounter (Signed)
Please advise 

## 2020-02-15 NOTE — Telephone Encounter (Signed)
I called. She had cancelled bone density when she was going on vacation when it was scheduled.  Discussed with her...Marland KitchenMarland KitchenMarland Kitchen Plan is , refer for ESI as she requested, skip elbow injection, reschedule bone density test,   ROV after both done thanks.

## 2020-02-17 ENCOUNTER — Other Ambulatory Visit: Payer: 59

## 2020-02-18 ENCOUNTER — Telehealth: Payer: Self-pay

## 2020-02-18 NOTE — Telephone Encounter (Signed)
noted 

## 2020-02-18 NOTE — Telephone Encounter (Signed)
Patient called she is requesting a order to be sent in to [facility] so she can receive a cortisone injection the same day as her back injection ( next Tuesday). CB:[phone]

## 2020-02-18 NOTE — Telephone Encounter (Signed)
Can you please advise? Per last phone message, you had spoken with patient and we were only doing ESI and skipping elbow injection.

## 2020-02-18 NOTE — Telephone Encounter (Signed)
I called . Left message. Lumbar only. thanks

## 2020-02-23 ENCOUNTER — Other Ambulatory Visit: Payer: 59

## 2020-02-26 ENCOUNTER — Ambulatory Visit: Payer: 59 | Admitting: Family

## 2020-02-26 ENCOUNTER — Other Ambulatory Visit: Payer: Self-pay

## 2020-02-26 ENCOUNTER — Ambulatory Visit
Admission: RE | Admit: 2020-02-26 | Discharge: 2020-02-26 | Disposition: A | Discharge status: Home / Self Care | Payer: 59 | Source: Ambulatory Visit | Attending: Family | Admitting: Family

## 2020-02-26 DIAGNOSIS — Z1231 Encounter for screening mammogram for malignant neoplasm of breast: Secondary | ICD-10-CM | POA: Diagnosis not present

## 2020-03-01 ENCOUNTER — Ambulatory Visit: Payer: 59 | Admitting: Dermatology

## 2020-03-01 ENCOUNTER — Encounter: Payer: Self-pay | Admitting: Dermatology

## 2020-03-01 ENCOUNTER — Other Ambulatory Visit: Payer: Self-pay

## 2020-03-01 DIAGNOSIS — L2489 Irritant contact dermatitis due to other agents: Secondary | ICD-10-CM

## 2020-03-01 DIAGNOSIS — L509 Urticaria, unspecified: Secondary | ICD-10-CM | POA: Diagnosis not present

## 2020-03-01 MED ORDER — FAMOTIDINE 20 MG PO TABS: 20.0000 mg | ORAL_TABLET | Freq: Two times a day (BID) | ORAL | 1 refills | Status: DC

## 2020-03-01 NOTE — Progress Notes (Signed)
   Follow-Up Visit   Subjective  Jodi Anderson is a 60 y.o. female who presents for the following: Rash (Pt c/o itchy rash that will come and go for several months, pt taking otc Allegra tablets daily, Pepcid tablets and using Triamcinolone cream, today her skin is clear of the rash ).   The following portions of the chart were reviewed this encounter and updated as appropriate:   Tobacco  Allergies  Meds  Problems  Med Hx  Surg Hx  Fam Hx      Review of Systems:  No other skin or systemic complaints except as noted in HPI or Assessment and Plan.  Objective  Well appearing patient in no apparent distress; mood and affect are within normal limits.  A focused examination was performed including face, chest, back. Relevant physical exam findings are noted in the Assessment and Plan.  Objective  arms,legs,chest,back,face: Skin clear of rash today, reviewed photos on patients phone showing edematous pink plaques  Objective  eyelids: erythema   Assessment & Plan  Urticaria arms,legs,chest,back,face  Reviewed CBC with diff, TSH, CMP, vitamin D all WNL  D/c allegra.  Start Zyrtec 10 mg take 1 tablet twice a day, if still breaking out increase to 2 tablets twice a day  Cont Pepcid 20 mg take 1 tablet po bid       Reordered Medications famotidine (PEPCID) 20 MG tablet  Hives  Reordered Medications famotidine (PEPCID) 20 MG tablet  Irritant contact dermatitis due to other agents eyelids  Start over the counter Hydrocortisone 1% cream apply to eyelids twice a day as needed for up to one week as needed for flares   Topical steroids (such as triamcinolone, fluocinolone, fluocinonide, mometasone, clobetasol, halobetasol, betamethasone, hydrocortisone) can cause thinning and lightening of the skin if they are used for too long in the same area. Your physician has selected the right strength medicine for your problem and area affected on the body. Please use your  medication only as directed by your physician to prevent side effects.    Return in about 6 weeks (around 04/12/2020) for hives.  I, Angelique Holm, CMA, am acting as scribe for Darden Dates, MD .  Documentation: I have reviewed the above documentation for accuracy and completeness, and I agree with the above.  Darden Dates, MD

## 2020-03-01 NOTE — Patient Instructions (Addendum)
Start Zyrtec 10 mg take 1 tablet twice a day, if still breaking out increase to 2 tablets twice a day   Stop allegra.   Continue Pepcid twice a day  Start over the counter Hydrocortisone 1% cream apply to eyelids twice a day for up to one week as needed for flares

## 2020-03-03 ENCOUNTER — Telehealth: Payer: Self-pay

## 2020-03-03 NOTE — Addendum Note (Signed)
Addended by: Sandi Mealy on: 03/03/2020 11:57 AM   Modules accepted: Orders

## 2020-03-03 NOTE — Telephone Encounter (Signed)
Pt left vm stating that she has been taking the Zyrtec BID in the morning and at night with a pepcid. She states that it is making her feel too tired. She says that the allegra didn't make her feel tired. She is wanting to know if she can take that and if she can take it BID or just once a day.

## 2020-03-03 NOTE — Telephone Encounter (Signed)
Unfortunately going up on the Allegra dose has not been shown to work better. However, there are two other newer antihistamines, Levocetirizine (Xyzal) and desloratadine (Clarinex), that typically do not cause drowsiness and do work better at the higher doses.   I would recommend starting with the levocetirizine 5 mg twice a day and if still breaking out in a week, increase to two tablets at night and one in the morning.   If still breaking out after that, increase to two in the morning and two at night.   If she has drowsiness with this or does not clear despite taking two in the morning and two at night, I would recommend switching to the Clarinex (we would prescribe).  I prescribed the Xyzal in case her insurance will cover it at a better price than the over the counter price. It looks like it would be around $17 for 120 tablets at [employer] with the GoodRx coupon with no insurance coverage.   Please let me know if she has any other questions or concerns. Thank you!

## 2020-03-09 ENCOUNTER — Other Ambulatory Visit: Payer: Self-pay

## 2020-03-09 MED ORDER — LEVOCETIRIZINE DIHYDROCHLORIDE 5 MG PO TABS: 5.0000 mg | ORAL_TABLET | ORAL | 0 refills | Status: DC

## 2020-03-09 NOTE — Progress Notes (Signed)
RX sent in and advised of information per Dr. Neale Burly.

## 2020-03-21 ENCOUNTER — Encounter: Payer: 59 | Admitting: Family

## 2020-03-22 ENCOUNTER — Other Ambulatory Visit: Payer: 59

## 2020-03-29 ENCOUNTER — Other Ambulatory Visit: Payer: 59

## 2020-03-30 ENCOUNTER — Other Ambulatory Visit: Payer: Self-pay

## 2020-03-30 ENCOUNTER — Ambulatory Visit
Admission: RE | Admit: 2020-03-30 | Discharge: 2020-03-30 | Disposition: A | Discharge status: Home / Self Care | Payer: 59 | Source: Ambulatory Visit | Attending: Orthopaedic Surgery | Admitting: Orthopaedic Surgery

## 2020-03-30 DIAGNOSIS — M5136 Other intervertebral disc degeneration, lumbar region: Secondary | ICD-10-CM

## 2020-03-30 MED ORDER — IOPAMIDOL (ISOVUE-M 200) INJECTION 41%
1.0000 mL | Freq: Once | INTRAMUSCULAR | Status: AC
  Administered 2020-03-30: 1 mL via EPIDURAL

## 2020-03-30 MED ORDER — METHYLPREDNISOLONE ACETATE 40 MG/ML INJ SUSP (RADIOLOG
120.0000 mg | Freq: Once | INTRAMUSCULAR | Status: AC
  Administered 2020-03-30: 120 mg via EPIDURAL

## 2020-03-30 NOTE — Discharge Instructions (Signed)

## 2020-03-31 ENCOUNTER — Other Ambulatory Visit (INDEPENDENT_AMBULATORY_CARE_PROVIDER_SITE_OTHER): Payer: 59

## 2020-03-31 DIAGNOSIS — R899 Unspecified abnormal finding in specimens from other organs, systems and tissues: Secondary | ICD-10-CM

## 2020-03-31 DIAGNOSIS — Z0000 Encounter for general adult medical examination without abnormal findings: Secondary | ICD-10-CM

## 2020-04-01 ENCOUNTER — Other Ambulatory Visit: Payer: Self-pay | Admitting: Dermatology

## 2020-04-01 ENCOUNTER — Telehealth: Payer: Self-pay | Admitting: Family

## 2020-04-01 LAB — IRON,TIBC AND FERRITIN PANEL
Ferritin: 137 ng/mL (ref 15–150)
Iron Saturation: 44 % (ref 15–55)
Iron: 130 ug/dL (ref 27–159)
Total Iron Binding Capacity: 297 ug/dL (ref 250–450)
UIBC: 167 ug/dL (ref 131–425)

## 2020-04-01 NOTE — Telephone Encounter (Signed)
Call pt  rec'ed testing order from cosmetic surgery, Dr Quincy Sheehan which asks for ekg prior to surgery Please sch f/u for ekg 2-3 weeks prior to surgery

## 2020-04-01 NOTE — Telephone Encounter (Signed)
Pt scheduled for EKG for surgery clearance 04/27/20.

## 2020-04-04 ENCOUNTER — Telehealth: Payer: Self-pay

## 2020-04-04 NOTE — Telephone Encounter (Signed)
Pt called about lab results that were not shown in MyChart. She states that our office requested the labs and that they were different than the [place] orders. Please advise

## 2020-04-05 ENCOUNTER — Other Ambulatory Visit: Payer: Self-pay

## 2020-04-05 NOTE — Telephone Encounter (Signed)
Did patient give you a form a lab request form? I am confused here? I don't see CBC, but patient said that it was drawn?

## 2020-04-05 NOTE — Telephone Encounter (Signed)
Pt was also made aware at the time of her lab visit that those results would go to the provider who ordered them. I cancelled our CBC order because it was on the lab slip the patient handed me as well. I also gave the patient a copy back of the lab slip in case she had problems with them running the test for some reason since we don't typically send our lbs they way she requested.

## 2020-04-05 NOTE — Telephone Encounter (Signed)
Apparently [facility] told her that we could draw labs for another provider here. So, a CBC was on that lab slip. Pt can have her provider send copy of results to you I guess.

## 2020-04-05 NOTE — Telephone Encounter (Signed)
Pt dropped off CBC form from [facility]. Placed in folder up front

## 2020-04-05 NOTE — Telephone Encounter (Signed)
CBC was not resulted through [place] & patient has brought copy from [facility]. Labs went to Dr. Quincy Sheehan for her upcoming surgery. She has written a note to advise on labs & I have added vitamins she is taking to her chart. Placed in red folder.

## 2020-04-06 NOTE — Telephone Encounter (Signed)
Call pt Her labs look great. No anemia  She can stop the following vitamins:  Iron, magnesium, vitamin C  In post menopausal women, below are doses of vit d and calcium I recommend  I recommend Calcium Citrate over the counter, and you may take a total of 600 to 800 mg per day in divided doses with meals for best absorption.   For bone health, you need adequate vitamin D, and I recommend you supplement as it is harder to do so with diet alone. I recommend cholecalciferol 800 units daily.  Also, please ensure you are following a diet high in calcium -- research shows better outcomes with dietary sources including kale, yogurt, broccolii, cheese, okra, almonds- to name a few.

## 2020-04-07 ENCOUNTER — Telehealth: Payer: Self-pay | Admitting: Family

## 2020-04-07 NOTE — Telephone Encounter (Signed)
I have advised patient on below. She is taking Vitamin D 1000 IU & she said the Calcium Citrate she has is plus D 200 IU? Can she take both together or too much Vitamin D?

## 2020-04-07 NOTE — Telephone Encounter (Signed)
Pt called and results reviewed.

## 2020-04-07 NOTE — Telephone Encounter (Signed)
Patient was returningcall about lab results 

## 2020-04-08 NOTE — Telephone Encounter (Signed)
Call pt She is taking 1200 units of vit d daily, more than 800 units which is maintence dose  I would recommend going forward she decrease the total daily amount  Perhaps she can find calcium without vitamin D?

## 2020-04-08 NOTE — Telephone Encounter (Signed)
I called and advised patient on below. She stated that she will get calcium by itself & purchase the 800 IU of Vitamin D.

## 2020-04-18 ENCOUNTER — Other Ambulatory Visit: Payer: Self-pay | Admitting: Dermatology

## 2020-04-18 ENCOUNTER — Other Ambulatory Visit: Payer: Self-pay | Admitting: Family

## 2020-04-18 DIAGNOSIS — G4700 Insomnia, unspecified: Secondary | ICD-10-CM

## 2020-04-18 NOTE — Telephone Encounter (Signed)
I do not see this medication listed in her previous notes, but she is requesting a refill. Please advise.

## 2020-04-20 ENCOUNTER — Other Ambulatory Visit: Payer: Self-pay

## 2020-04-20 ENCOUNTER — Encounter: Payer: Self-pay | Admitting: Dermatology

## 2020-04-20 ENCOUNTER — Ambulatory Visit: Payer: 59 | Admitting: Dermatology

## 2020-04-20 DIAGNOSIS — L820 Inflamed seborrheic keratosis: Secondary | ICD-10-CM | POA: Diagnosis not present

## 2020-04-20 DIAGNOSIS — L501 Idiopathic urticaria: Secondary | ICD-10-CM | POA: Diagnosis not present

## 2020-04-20 DIAGNOSIS — L719 Rosacea, unspecified: Secondary | ICD-10-CM

## 2020-04-20 DIAGNOSIS — L570 Actinic keratosis: Secondary | ICD-10-CM | POA: Diagnosis not present

## 2020-04-20 NOTE — Patient Instructions (Addendum)
Rosacea  What is rosacea? Rosacea (say: ro-zay-sha) is a common skin disease that usually begins as a trend of flushing or blushing easily.  As rosacea progresses, a persistent redness in the center of the face will develop and may gradually spread beyond the nose and cheeks to the forehead and chin.  In some cases, the ears, chest, and back could be affected.  Rosacea may appear as tiny blood vessels or small red bumps that occur in crops.  Frequently they can contain pus, and are called "pustules".  If the bumps do not contain pus, they are referred to as "papules".  Rarely, in prolonged, untreated cases of rosacea, the oil glands of the nose and cheeks may become permanently enlarged.  This is called rhinophyma, and is seen more frequently in men.  Signs and Risks In its beginning stages, rosacea tends to come and go, which makes it difficult to recognize.  It can start as intermittent flushing of the face.  Eventually, blood vessels may become permanently visible.  Pustules and papules can appear, but can be mistaken for adult acne.  People of all races, ages, genders and ethnic groups are at risk of developing rosacea.  However, it is more common in women (especially around menopause) and adults with fair skin between the ages of 30 and 50.  Treatment Dermatologists typically recommend a combination of treatments to effectively manage rosacea.  Treatment can improve symptoms and may stop the progression of the rosacea.  Treatment may involve both topical and oral medications.  The tetracycline antibiotics are often used for their anti-inflammatory effect; however, because of the possibility of developing antibiotic resistance, they should not be used long term at full dose.  For dilated blood vessels the options include electrodessication (uses electric current through a small needle), laser treatment, and cosmetics to hide the redness.   With all forms of treatment, improvement is a slow process, and  patients may not see any results for the first 3-4 weeks.  It is very important to avoid the sun and other triggers.  Patients must wear sunscreen daily.  Skin Care Instructions: 1. Cleanse the skin with a mild soap such as CeraVe cleanser, Cetaphil cleanser, or Dove soap once or twice daily as needed. 2. Moisturize with Eucerin Redness Relief Daily Perfecting Lotion (has a subtle green tint), CeraVe Moisturizing Cream, or Oil of Olay Daily Moisturizer with sunscreen every morning and/or night as recommended. 3. Makeup should be "non-comedogenic" (won't clog pores) and be labeled "for sensitive skin". Good choices for cosmetics are: Neutrogena, Almay, and Physician's Formula.  Any product with a green tint tends to offset a red complexion. 4. If your eyes are dry and irritated, use artificial tears 2-3 times per day and cleanse the eyelids daily with baby shampoo.  Have your eyes examined at least every 2 years.  Be sure to tell your eye doctor that you have rosacea. 5. Alcoholic beverages tend to cause flushing of the skin, and may make rosacea worse. 6. Always wear sunscreen, protect your skin from extreme hot and cold temperatures, and avoid spicy foods, hot drinks, and mechanical irritation such as rubbing, scrubbing, or massaging the face.  Avoid harsh skin cleansers, cleansing masks, astringents, and exfoliation. If a particular product burns or makes your face feel tight, then it is likely to flare your rosacea. 7. If you are having difficulty finding a sunscreen that you can tolerate, you may try switching to a chemical-free sunscreen.  These are ones whose active   ingredient is zinc oxide or titanium dioxide only.  They should also be fragrance free, non-comedogenic, and labeled for sensitive skin. 8. Rosacea triggers may vary from person to person.  There are a variety of foods that have been reported to trigger rosacea.  Some patients find that keeping a diary of what they were doing when they  flared helps them avoid triggers.   If you have any questions or concerns for your doctor, please call our main line at [phone] and press option 4 to reach your doctor's medical assistant. If no one answers, please leave a voicemail as directed and we will return your call as soon as possible. Messages left after 4 pm will be answered the following business day.   You may also send [place] a message via MyChart. We typically respond to MyChart messages within 1-2 business days.  For prescription refills, please ask your pharmacy to contact our office. Our fax number is [phone].  If you have an urgent issue when the clinic is closed that cannot wait until the next business day, you can page your doctor at the number below.    Please note that while we do our best to be available for urgent issues outside of office hours, we are not available 24/7.   If you have an urgent issue and are unable to reach [place], you may choose to seek medical care at your doctor's office, retail clinic, urgent care center, or emergency room.  If you have a medical emergency, please immediately call 911 or go to the emergency department.  Pager Numbers  - Dr. Gwen Pounds: [phone]  - Dr. Neale Burly: [phone]  - Dr. Roseanne Reno: [phone]  In the event of inclement weather, please call our main line at [phone] for an update on the status of any delays or closures.  Dermatology Medication Tips: Please keep the boxes that topical medications come in in order to help keep track of the instructions about where and how to use these. Pharmacies typically print the medication instructions only on the boxes and not directly on the medication tubes.   If your medication is too expensive, please contact our office at [phone] option 4 or send [place] a message through MyChart.   We are unable to tell what your co-pay for medications will be in advance as this is different depending on your insurance coverage.  However, we may be able to find a substitute medication at lower cost or fill out paperwork to get insurance to cover a needed medication.   If a prior authorization is required to get your medication covered by your insurance company, please allow [place] 1-2 business days to complete this process.  Drug prices often vary depending on where the prescription is filled and some pharmacies may offer cheaper prices.  The website www.goodrx.com contains coupons for medications through different pharmacies. The prices here do not account for what the cost may be with help from insurance (it may be cheaper with your insurance), but the website can give you the price if you did not use any insurance.  - You can print the associated coupon and take it with your prescription to the pharmacy.  - You may also stop by our office during regular business hours and pick up a GoodRx coupon card.  - If you need your prescription sent electronically to a different pharmacy, notify our office through [facility] or by phone at [phone] option 4.   Cryotherapy Aftercare  . Wash gently with  soap and water everyday.   Marland Kitchen Apply Vaseline and Band-Aid daily until healed.  Prior to procedure, discussed risks of blister formation, small wound, skin dyspigmentation, or rare scar following cryotherapy.

## 2020-04-20 NOTE — Progress Notes (Signed)
   Follow-Up Visit   Subjective  Jodi Anderson is a 60 y.o. female who presents for the following: Follow-up (Patient here today for 6 week hives follow up., mostly at arms and chest. About 4 weeks she broke out and called Dr. Gwen Pounds at home. He recommended 2 Allegra, 2 Pepcid and 1 Singulair, she tapered down but then increased again to 1 Allegra, 1 Pepcid and 1 Singulair 5 days ago. Patient is continuing to get hives. ).  She has also noticed worsening redness of cheeks, particularly left cheek.  The following portions of the chart were reviewed this encounter and updated as appropriate:   Tobacco  Allergies  Meds  Problems  Med Hx  Surg Hx  Fam Hx      Review of Systems:  No other skin or systemic complaints except as noted in HPI or Assessment and Plan.  Objective  Well appearing patient in no apparent distress; mood and affect are within normal limits.  A focused examination was performed including neck, chest, arms. Relevant physical exam findings are noted in the Assessment and Plan.  Objective  face: Mid face erythema  Objective  nasal dorsum: Erythematous thin papules/macules with gritty scale.   Objective  Left arm x 4 (4): Erythematous keratotic or waxy stuck-on papule or plaque.   Objective  Right Forearm - Anterior: Edematous pink plaques at chest and arms   Assessment & Plan  Rosacea face  Rosacea is a chronic progressive skin condition usually affecting the face of adults, causing redness and/or acne bumps. It is treatable but not curable. It sometimes affects the eyes (ocular rosacea) as well. It may respond to topical and/or systemic medication and can flare with stress, sun exposure, alcohol, exercise and some foods.  Daily application of broad spectrum spf 30+ sunscreen to face is recommended to reduce flares.  May consider therapy at follow-up.    AK (actinic keratosis) nasal dorsum  Recheck at follow up.   Inflamed seborrheic  keratosis (4) Left arm x 4  Prior to procedure, discussed risks of blister formation, small wound, skin dyspigmentation, or rare scar following cryotherapy.   Vs Lentigo, inflamed  Destruction of lesion - Left arm x 4  Destruction method: cryotherapy   Informed consent: discussed and consent obtained   Lesion destroyed using liquid nitrogen: Yes   Cryotherapy cycles:  2 Outcome: patient tolerated procedure well with no complications   Post-procedure details: wound care instructions given    Chronic idiopathic urticaria Right Forearm - Anterior  Poorly controlled. Chronic condition with duration or expected duration over one year. Condition is bothersome to patient. Currently flared.  Labs unremarkable Has failed antihistamines  Will plan to start Xolair 150 mg q 4 weeks if approved with insurance. Discussed risk of anaphylaxis, headache, nausea, leg pain, that it must be given as an injection in office.  Continue Allegra 2 po qd Continue Pepcid 1 po bid Continue Singulair qd    Return in about 4 weeks (around 05/18/2020) for hives.  Anise Salvo, RMA, am acting as scribe for Darden Dates, MD .  Documentation: I have reviewed the above documentation for accuracy and completeness, and I agree with the above.  Darden Dates, MD

## 2020-04-21 ENCOUNTER — Other Ambulatory Visit: Payer: Self-pay

## 2020-04-21 DIAGNOSIS — L509 Urticaria, unspecified: Secondary | ICD-10-CM

## 2020-04-21 MED ORDER — OMALIZUMAB 150 MG/ML ~~LOC~~ SOSY: 150.0000 mg | PREFILLED_SYRINGE | SUBCUTANEOUS | 3 refills | Status: DC

## 2020-04-25 ENCOUNTER — Telehealth: Payer: Self-pay

## 2020-04-25 DIAGNOSIS — R21 Rash and other nonspecific skin eruption: Secondary | ICD-10-CM

## 2020-04-25 NOTE — Telephone Encounter (Signed)
Patient called with some questions: 1) She states her Xolair has been approved. I am still working on this so until I can see the approval and figure out where the RX needs to be filled,  I don't have a update on that situation.  2) She states you mentioned she does have rosacea but nothing was prescribed. Patient has been using her spouses Soolantra cream. 3) She also read somewhere that Vitamin D and C can help but she also read where these can make rosacea worse. Should would like your input.

## 2020-04-26 ENCOUNTER — Encounter: Payer: Self-pay | Admitting: Dermatology

## 2020-04-26 ENCOUNTER — Other Ambulatory Visit: Payer: Self-pay | Admitting: Dermatology

## 2020-04-26 DIAGNOSIS — L509 Urticaria, unspecified: Secondary | ICD-10-CM

## 2020-04-26 MED ORDER — OMALIZUMAB 150 MG/ML ~~LOC~~ SOSY: 300.0000 mg | PREFILLED_SYRINGE | SUBCUTANEOUS | 3 refills | Status: DC

## 2020-04-26 NOTE — Telephone Encounter (Signed)
Spoke with patient. Will order ANA given facial erythema and chronic urticaria.   Discussed rosacea options given she has erythematotelangiectatic type rosacea. Recommended trial of Soolantra for a few weeks to see if she notices any difference. Also discussed BBL treatment (best option) and Rhofade or homemade anti-redness cream with Afrin mixed in Cerave. Advised that Afrin in Cerave and Rhofade both temporarily reduce redness for a few hours. Sent recipe for Afrin in Cerave in [employer].   Advised I do not recommend vitamin D for rosacea. Advised there's no evidence that I am aware of for using vitamin C supplements for rosacea. Topical vitamin C may help rosacea some but it's also not my first line treatment as we have more well studied options.   Can you please print a copy of the ANA lab slip and leave it for her at the front desk? Thank you!

## 2020-04-27 ENCOUNTER — Other Ambulatory Visit: Payer: Self-pay

## 2020-04-27 ENCOUNTER — Encounter: Payer: Self-pay | Admitting: Family

## 2020-04-27 ENCOUNTER — Ambulatory Visit (INDEPENDENT_AMBULATORY_CARE_PROVIDER_SITE_OTHER): Payer: 59 | Admitting: Family

## 2020-04-27 VITALS — BP 116/72 | HR 67 | Temp 97.6°F | Ht 62.01 in | Wt 123.0 lb

## 2020-04-27 DIAGNOSIS — Z01818 Encounter for other preprocedural examination: Secondary | ICD-10-CM

## 2020-04-27 NOTE — Progress Notes (Signed)
Subjective:    Patient ID: Jodi Anderson, female    DOB: 1960-05-18, 60 y.o.   MRN: [id number]  CC: CORYNNE SCIBILIA is a 60 y.o. female who presents today for pre operative visit  HPI: She is here today for pre op visit.   She feels well. No complaints.   She denies cp, sob, palpitations, dizziness.   She exercises regularly.   Planning on plastic surgery for bilateral knee lifts May 18th with Dr Quincy Sheehan in [place], [place].  She had labs done through is office however I am unable to see these. She will bring them to the office.        HISTORY:  History reviewed. No pertinent past medical history. Past Surgical History:  Procedure Laterality Date  . CESAREAN SECTION     3x  . tummy tuck    . VAGINAL DELIVERY     Family History  Problem Relation Age of Onset  . Epilepsy Son   . Cancer Paternal Grandmother        breast  . Cancer Maternal Aunt        lung    Allergies: Codeine Current Outpatient Medications on File Prior to Visit  Medication Sig Dispense Refill  . calcium-vitamin D (OSCAL WITH D) 500-200 MG-UNIT tablet Take 1 tablet by mouth daily.    . diclofenac (VOLTAREN) 75 MG EC tablet Take 75 mg by mouth 2 (two) times daily.    . famotidine (PEPCID) 20 MG tablet Take 1 tablet (20 mg total) by mouth 2 (two) times daily. 60 tablet 1  . Magnesium 500 MG TABS Take 1 tablet by mouth daily.    . montelukast (SINGULAIR) 10 MG tablet TAKE 1 TABLET BY MOUTH DAILY FOR HIVES 30 tablet 5  . triamcinolone (KENALOG) 0.1 % Apply 1 application topically 2 (two) times daily.    . vitamin C (ASCORBIC ACID) 500 MG tablet Take 500 mg by mouth daily.    Marland Kitchen zolpidem (AMBIEN) 10 MG tablet TAKE 1 TABLET BY MOUTH AT BEDTIME AS NEEDED FOR SLEEP 30 tablet 1  . omalizumab (XOLAIR) 150 MG/ML prefilled syringe Inject 300 mg into the skin every 28 (twenty-eight) days. (Patient not taking: Reported on 04/27/2020) 2 mL 3   No current facility-administered medications on file prior to visit.     Social History   Tobacco Use  . Smoking status: Never Smoker  . Smokeless tobacco: Never Used  Vaping Use  . Vaping Use: Never used  Substance Use Topics  . Alcohol use: Yes  . Drug use: No    Review of Systems  Constitutional: Negative for chills and fever.  Respiratory: Negative for cough and shortness of breath.   Cardiovascular: Negative for chest pain and palpitations.  Gastrointestinal: Negative for nausea and vomiting.      Objective:    BP 116/72   Pulse 67   Temp 97.6 F (36.4 C)   Ht 5' 2.01" (1.575 m)   Wt 123 lb (55.8 kg)   LMP 06/23/2014 (Approximate)   SpO2 99%   BMI 22.49 kg/m  BP Readings from Last 3 Encounters:  04/27/20 116/72  03/30/20 105/62  02/10/20 98/70   Wt Readings from Last 3 Encounters:  04/27/20 123 lb (55.8 kg)  02/10/20 127 lb 6.4 oz (57.8 kg)  10/20/19 121 lb (54.9 kg)    Physical Exam Vitals reviewed.  Constitutional:      Appearance: She is well-developed.  Eyes:     Conjunctiva/sclera: Conjunctivae normal.  Cardiovascular:     Rate and Rhythm: Normal rate and regular rhythm.     Pulses: Normal pulses.     Heart sounds: Normal heart sounds.  Pulmonary:     Effort: Pulmonary effort is normal.     Breath sounds: Normal breath sounds. No wheezing, rhonchi or rales.  Musculoskeletal:     Right lower leg: No edema.     Left lower leg: No edema.  Skin:    General: Skin is warm and dry.  Neurological:     Mental Status: She is alert.  Psychiatric:        Speech: Speech normal.        Behavior: Behavior normal.        Thought Content: Thought content normal.        Assessment & Plan:   Problem List Items Addressed This Visit      Other   Preop examination    Patient well appearing. I am unable to recent labs; last labs in chart 3 months ago. Advised for safest surgery, these need to be up to date. She will send me these labs prior to surgery for my review.  EKG is NSR and no acute ischemia. I have sent Dr  Sandie Ano a staff message regarding poor R wave progression lead V3 which appears different than prior EKG, he agreed that this finding is non significant in low risk procedure.        Other Visit Diagnoses    Preoperative clearance    -  Primary   Relevant Orders   EKG 12-Lead (Completed)       I have discontinued [name] R. Mills's levocetirizine and ferrous sulfate. I am also having her maintain her triamcinolone cream, diclofenac, famotidine, Magnesium, calcium-vitamin D, vitamin C, zolpidem, montelukast, and omalizumab.   No orders of the defined types were placed in this encounter.   Return precautions given.   Risks, benefits, and alternatives of the medications and treatment plan prescribed today were discussed, and patient expressed understanding.   Education regarding symptom management and diagnosis given to patient on AVS.  Continue to follow with Allegra Grana, FNP for routine health maintenance.   Jodi Anderson and I agreed with plan.   Rennie Plowman, FNP

## 2020-05-02 DIAGNOSIS — Z01818 Encounter for other preprocedural examination: Secondary | ICD-10-CM | POA: Insufficient documentation

## 2020-05-02 NOTE — Assessment & Plan Note (Addendum)
Patient well appearing. I am unable to recent labs; last labs in chart 3 months ago. Advised for safest surgery, these need to be up to date. She will send me these labs prior to surgery for my review.  EKG is NSR and no acute ischemia. I have sent Dr Sandie Ano a staff message regarding poor R wave progression lead V3 which appears different than prior EKG, he agreed that this finding is non significant in low risk procedure.

## 2020-05-03 ENCOUNTER — Ambulatory Visit: Payer: 59 | Attending: Internal Medicine

## 2020-05-03 ENCOUNTER — Other Ambulatory Visit: Payer: Self-pay | Admitting: Dermatology

## 2020-05-03 DIAGNOSIS — Z23 Encounter for immunization: Secondary | ICD-10-CM

## 2020-05-03 NOTE — Progress Notes (Signed)
   Covid-19 Vaccination Clinic  Name:  Jodi Anderson    MRN: [id number] DOB: September 10, 1960  05/03/2020  [name] was observed post Covid-19 immunization for 15 minutes without incident. She was provided with Vaccine Information Sheet and instruction to access the V-Safe system.   [name] was instructed to call 911 with any severe reactions post vaccine: Marland Kitchen Difficulty breathing  . Swelling of face and throat  . A fast heartbeat  . A bad rash all over body  . Dizziness and weakness   Immunizations Administered    Name Date Dose VIS Date Route   PFIZER Comrnaty(Gray TOP) Covid-19 Vaccine 05/03/2020  1:07 PM 0.3 mL 12/31/2019 Intramuscular   Manufacturer: [employer], [employer]   Lot: [id number]   NDC: [phone]

## 2020-05-04 ENCOUNTER — Other Ambulatory Visit: Payer: Self-pay

## 2020-05-04 MED ORDER — PFIZER-BIONT COVID-19 VAC-TRIS 30 MCG/0.3ML IM SUSP
INTRAMUSCULAR | 0 refills | Status: DC
  Filled 2020-05-04: qty 0.3, 20d supply, fill #0

## 2020-05-06 ENCOUNTER — Other Ambulatory Visit: Payer: Self-pay

## 2020-05-09 ENCOUNTER — Telehealth: Payer: Self-pay

## 2020-05-09 NOTE — Telephone Encounter (Signed)
Returned call to patient concerning Xolair prescription. Patient spoke with [facility] over the weekend and was told that we needed to call them. I called the number patient gave me [phone] and spoke with Tiffany. I gave her the information below as the patient gave to me -  [employer] Assistance Group #[id number] Member #[id number] [facility] #[id number] [facility] [id number] Digital Mastercard [phone] Exp 06/24 Code 751 Fax # to submit a claim is [phone]  Elmarie Shiley explained the group # was not going through so she was going to look into it and get back with [place] so we can try and get patient's Xolair shipped out by Wednesday. Patient is going out of town Thursday and is trying to get injections beforehand, JS

## 2020-05-09 NOTE — Telephone Encounter (Signed)
Tiffany with [facility] left msg advising she did speak with patient and Xolair order in complete. Xolair will be delivered tomorrow, April 19th. Patient needs to be called and scheduled for initial injection, JS

## 2020-05-11 ENCOUNTER — Other Ambulatory Visit: Payer: Self-pay

## 2020-05-11 ENCOUNTER — Ambulatory Visit: Payer: 59 | Admitting: Dermatology

## 2020-05-11 ENCOUNTER — Encounter: Payer: Self-pay | Admitting: Dermatology

## 2020-05-11 DIAGNOSIS — L719 Rosacea, unspecified: Secondary | ICD-10-CM | POA: Diagnosis not present

## 2020-05-11 DIAGNOSIS — L509 Urticaria, unspecified: Secondary | ICD-10-CM

## 2020-05-11 DIAGNOSIS — L501 Idiopathic urticaria: Secondary | ICD-10-CM | POA: Diagnosis not present

## 2020-05-11 MED ORDER — RHOFADE 1 % EX CREA: TOPICAL_CREAM | CUTANEOUS | 2 refills | Status: DC

## 2020-05-11 MED ORDER — OMALIZUMAB 150 MG/ML ~~LOC~~ SOSY
150.0000 mg | PREFILLED_SYRINGE | Freq: Once | SUBCUTANEOUS | Status: AC
  Administered 2020-05-11: 150 mg via SUBCUTANEOUS

## 2020-05-11 MED ORDER — EPINEPHRINE 0.3 MG/0.3ML IJ SOAJ: INTRAMUSCULAR | 1 refills | Status: DC

## 2020-05-11 NOTE — Progress Notes (Signed)
   Follow-Up Visit   Subjective  Jodi Anderson is a 60 y.o. female who presents for the following: Follow-up (Patient here today for follow up for hives. She is here to start Xolair. Currently patient is taking 2 Allegra, 1 Pepcid and 1 Singulair in the am and 1 Pepcid in the evening. ).  Also concerned about rosacea. She has started soolantra with possibly some improvement.   The following portions of the chart were reviewed this encounter and updated as appropriate:   Tobacco  Allergies  Meds  Problems  Med Hx  Surg Hx  Fam Hx      Review of Systems:  No other skin or systemic complaints except as noted in HPI or Assessment and Plan.  Objective  Well appearing patient in no apparent distress; mood and affect are within normal limits.  A focused examination was performed including bilateral arms. Relevant physical exam findings are noted in the Assessment and Plan.  Objective  Face: Mid face erythema  Objective  chest and arms: Edematous pink plaques    Assessment & Plan  Rosacea Face  Chronic condition with duration or expected duration over one year. Condition is bothersome to patient. Currently flared.  Rosacea is a chronic progressive skin condition usually affecting the face of adults, causing redness and/or acne bumps. It is treatable but not curable. It sometimes affects the eyes (ocular rosacea) as well. It may respond to topical and/or systemic medication and can flare with stress, sun exposure, alcohol, exercise and some foods.  Daily application of broad spectrum spf 30+ sunscreen to face is recommended to reduce flares.  Start Rhofade QAM  Continue Soolantra (has sample at home)    Ordered Medications: Oxymetazoline HCl (RHOFADE) 1 % CREA  Chronic idiopathic urticaria chest and arms  Chronic condition with duration or expected duration over one year. Condition is bothersome to patient. Currently not at goal despite Allegra, Pepcid and  Singulair.  Discussed risk of anaphylaxis, headache, nausea, leg pain with Xolair injections.  Continue 2 Allegra QAM, 1 Pepcid BID and Singulair QAM for 2 more weeks then may taper off if well controlled.   Start Xolair 150mg /mL today. Injected Xolair 150mg /mL to upper arms bilateral, patient tolerated well.   BP prior to injection at 12:28 100/66 BP 15 minutes later at 12:48 94/61 BP 15 minutes later 1:04pm 97/67  Epipen prescribed and pt advised how to use it. In case of signs of anaphylaxis, inject epipen immediately and seek immediate medical care in emergency department.   Return for as scheduled.  , RMA, am acting as scribe for 06-30-1977, MD .  Documentation: I have reviewed the above documentation for accuracy and completeness, and I agree with the above.  Anise Salvo, MD

## 2020-05-11 NOTE — Patient Instructions (Signed)

## 2020-05-25 ENCOUNTER — Ambulatory Visit: Payer: 59 | Admitting: Dermatology

## 2020-05-30 ENCOUNTER — Telehealth: Payer: Self-pay

## 2020-05-30 NOTE — Telephone Encounter (Signed)
[facility] called to let us know delivery for Xolair for this pt will be May 11 between 8-5

## 2020-06-07 ENCOUNTER — Other Ambulatory Visit: Payer: Self-pay

## 2020-06-07 ENCOUNTER — Ambulatory Visit: Payer: 59 | Admitting: Dermatology

## 2020-06-07 DIAGNOSIS — L501 Idiopathic urticaria: Secondary | ICD-10-CM | POA: Diagnosis not present

## 2020-06-07 DIAGNOSIS — L209 Atopic dermatitis, unspecified: Secondary | ICD-10-CM | POA: Diagnosis not present

## 2020-06-07 DIAGNOSIS — L719 Rosacea, unspecified: Secondary | ICD-10-CM | POA: Diagnosis not present

## 2020-06-07 MED ORDER — TRIAMCINOLONE ACETONIDE 0.1 % EX CREA: TOPICAL_CREAM | CUTANEOUS | 3 refills | Status: DC

## 2020-06-07 MED ORDER — OMALIZUMAB 150 MG/ML ~~LOC~~ SOSY
300.0000 mg | PREFILLED_SYRINGE | Freq: Once | SUBCUTANEOUS | Status: AC
Start: 2020-06-07 — End: 2020-06-07
  Administered 2020-06-07: 300 mg via SUBCUTANEOUS

## 2020-06-07 MED ORDER — IVERMECTIN 1 % EX CREA: 1.0000 "application " | TOPICAL_CREAM | Freq: Every day | CUTANEOUS | 5 refills | Status: DC | PRN

## 2020-06-07 NOTE — Progress Notes (Signed)
   Follow-Up Visit   Subjective  Jodi Anderson is a 60 y.o. female who presents for the following: Urticaria (Pt here for 1 mo urticaria f/u. Treating with xolair injections. Pt states that she has been weaning off of her antihistamines. She states that she has had some breakouts on chest, arms, and legs but that they have not been itchy. ).   The following portions of the chart were reviewed this encounter and updated as appropriate:  Tobacco  Allergies  Meds  Problems  Med Hx  Surg Hx  Fam Hx      Review of Systems: No other skin or systemic complaints except as noted in HPI or Assessment and Plan.   Objective  Well appearing patient in no apparent distress; mood and affect are within normal limits.  A focused examination was performed including chest, arms, legs. Relevant physical exam findings are noted in the Assessment and Plan.  Objective  Chest - Medial [facility]): Clear on exam today  Objective  Chest: Scaly pink papules coalescing to plaques   Objective  face: Mid face erythema with telangiectasias  Assessment & Plan  Chronic idiopathic urticaria Chest - Medial (Center)  Chronic condition with duration or expected duration over one year. Condition is bothersome to patient. Not currently at goal.  Still getting some hives in the last couple of weeks but not as much itch.  May decrease antihistamines to PRN.   Xolair injections given today. 150 mg/mL injected into right and left arms. Pt tolerated well.   Lot: [id number] Exp: 02/2021  BP before injection - 95/64 BP immediately after - 94/65 BP 15 min after injection - 93/65 BP 30 min after injection - 97/69  Atopic dermatitis, unspecified type Chest  Start TMC 0.1% cream. Apply BID up to two weeks to affected areas. Avoid applying to face, groin, and axilla. Use as directed. Risk of skin atrophy with long-term use reviewed.   Topical steroids (such as triamcinolone, fluocinolone, fluocinonide,  mometasone, clobetasol, halobetasol, betamethasone, hydrocortisone) can cause thinning and lightening of the skin if they are used for too long in the same area. Your physician has selected the right strength medicine for your problem and area affected on the body. Please use your medication only as directed by your physician to prevent side effects.    triamcinolone cream (KENALOG) 0.1 % - Chest  Rosacea face  Chronic condition with duration or expected duration over one year. Papules at goal but still with persistent erythema.  Continue Soolantra. Apply daily to affected areas.  Start oxymetazoline QAM  Ivermectin (SOOLANTRA) 1 % CREA - face  Other Related Medications Oxymetazoline HCl (RHOFADE) 1 % CREA  Return in about 1 month (around 07/08/2020) for xolair.   I, Epifania Gore, MA, scribed for Sandi Mealy, MD.  Documentation: I have reviewed the above documentation for accuracy and completeness, and I agree with the above.  Darden Dates, MD

## 2020-06-12 ENCOUNTER — Encounter: Payer: Self-pay | Admitting: Dermatology

## 2020-06-18 ENCOUNTER — Other Ambulatory Visit: Payer: Self-pay | Admitting: Family

## 2020-06-18 DIAGNOSIS — G4700 Insomnia, unspecified: Secondary | ICD-10-CM

## 2020-06-21 NOTE — Telephone Encounter (Signed)
Last OV for4/6/22, last filled 04/18/20 for 30 and 1 refill?

## 2020-07-06 ENCOUNTER — Other Ambulatory Visit: Payer: Self-pay

## 2020-07-06 ENCOUNTER — Encounter: Payer: Self-pay | Admitting: Dermatology

## 2020-07-06 ENCOUNTER — Ambulatory Visit: Payer: 59 | Admitting: Dermatology

## 2020-07-06 DIAGNOSIS — L501 Idiopathic urticaria: Secondary | ICD-10-CM | POA: Diagnosis not present

## 2020-07-06 DIAGNOSIS — L719 Rosacea, unspecified: Secondary | ICD-10-CM | POA: Diagnosis not present

## 2020-07-06 MED ORDER — OMALIZUMAB 150 MG/ML ~~LOC~~ SOSY
300.0000 mg | PREFILLED_SYRINGE | Freq: Once | SUBCUTANEOUS | Status: AC
  Administered 2020-07-06: 13:00:00 300 mg via SUBCUTANEOUS

## 2020-07-06 MED ORDER — METRONIDAZOLE 0.75 % EX CREA: TOPICAL_CREAM | Freq: Two times a day (BID) | CUTANEOUS | 2 refills | Status: DC

## 2020-07-06 NOTE — Patient Instructions (Addendum)

## 2020-07-06 NOTE — Progress Notes (Signed)
   Follow-Up Visit   Subjective  Jodi Anderson is a 60 y.o. female who presents for the following: Follow-up (Patient here today for follow up on chronic idiopathic urticaria at chest. She was prescribed xolair and has been using for several months. She reports it has been helping, has had a few break out since. ).  The following portions of the chart were reviewed this encounter and updated as appropriate:  Tobacco  Allergies  Meds  Problems  Med Hx  Surg Hx  Fam Hx       Objective  Well appearing patient in no apparent distress; mood and affect are within normal limits.  A focused examination was performed including face, neck, chest, arms . Relevant physical exam findings are noted in the Assessment and Plan.  Chest - Medial (Center) Clear today  Head - Anterior (Face) Very mild mid face erythema   Assessment & Plan  Chronic idiopathic urticaria Chest - Medial (Center)  Chronic condition with duration or expected duration over one year. Condition is bothersome to patient. Not currently at goal.   Still getting some hives in the last couple of weeks only with stressed   Continue Allegra twice a day as needed for flares. Call if not responding well to therapy.   Reviewed rare risk of anaphylaxis with Xolair. Pt keeps epipen in her purse.  Xolair injections given today. 150 mg/mL injected into right and left arms. Pt tolerated well.  BP before injection - 90/69 BP immediately after - 90/64 BP 15 min after injection at 1:25- 94/65 BP 30 min after injection at 1:40 - 94/67  omalizumab (XOLAIR) prefilled syringe 300 mg - Chest - Medial (Center)   Rosacea Head - Anterior (Face)  Chronic condition with duration or expected duration over one year.   Currently well-controlled using husband's soolantra. She advises her insurance said not covered. Will send metronidazole to see if covered. Otherwise will send soolantra to [facility] for better pricing.  Continue  Rhofade daily as needed.   Rosacea is a chronic progressive skin condition usually affecting the face of adults, causing redness and/or acne bumps. It is treatable but not curable. It sometimes affects the eyes (ocular rosacea) as well. It may respond to topical and/or systemic medication and can flare with stress, sun exposure, alcohol, exercise and some foods.  Daily application of broad spectrum spf 30+ sunscreen to face is recommended to reduce flares.      metroNIDAZOLE (METROCREAM) 0.75 % cream - Head - Anterior (Face) Apply topically 2 (two) times daily. At face  Related Medications Oxymetazoline HCl (RHOFADE) 1 % CREA Apply QAM to entire face  Ivermectin (SOOLANTRA) 1 % CREA Apply 1 application topically daily as needed.  Return for 1 month nurse visit xolair on wed or thursday, 3-4 month follow-up. I, Asher Muir, CMA, am acting as scribe for Darden Dates, MD.  Documentation: I have reviewed the above documentation for accuracy and completeness, and I agree with the above.  Darden Dates, MD

## 2020-07-25 ENCOUNTER — Other Ambulatory Visit: Payer: Self-pay | Admitting: Dermatology

## 2020-07-25 DIAGNOSIS — L509 Urticaria, unspecified: Secondary | ICD-10-CM

## 2020-08-01 ENCOUNTER — Telehealth: Payer: Self-pay

## 2020-08-01 NOTE — Telephone Encounter (Signed)
Spoke with [facility] and patient's Xolair will be delivered 08/03/20./js

## 2020-08-02 ENCOUNTER — Other Ambulatory Visit: Payer: Self-pay | Admitting: Family

## 2020-08-02 DIAGNOSIS — Z760 Encounter for issue of repeat prescription: Secondary | ICD-10-CM

## 2020-08-03 ENCOUNTER — Telehealth: Payer: Self-pay

## 2020-08-03 NOTE — Telephone Encounter (Signed)
The medication was D/C in January. Last time it was ordered was may 2021 per medication history.

## 2020-08-03 NOTE — Telephone Encounter (Signed)
Pt would like a refill on diclofenac (VOLTAREN) 75 MG EC tablet-she states that she uses it as needed.

## 2020-08-04 ENCOUNTER — Ambulatory Visit: Payer: 59

## 2020-08-05 MED ORDER — DICLOFENAC SODIUM 75 MG PO TBEC: 75.0000 mg | DELAYED_RELEASE_TABLET | Freq: Two times a day (BID) | ORAL | 1 refills | Status: DC | PRN

## 2020-08-05 NOTE — Addendum Note (Signed)
Addended by: Allegra Grana on: 08/05/2020 10:18 AM   Modules accepted: Orders

## 2020-08-05 NOTE — Telephone Encounter (Signed)
Pt notified that refill was sent. 

## 2020-08-05 NOTE — Telephone Encounter (Signed)
Refilled Let pt know

## 2020-08-05 NOTE — Telephone Encounter (Signed)
Patient called about the status of the medication she would like to be filled. Please see note below.

## 2020-08-08 ENCOUNTER — Ambulatory Visit (INDEPENDENT_AMBULATORY_CARE_PROVIDER_SITE_OTHER): Payer: 59

## 2020-08-08 ENCOUNTER — Other Ambulatory Visit: Payer: Self-pay

## 2020-08-08 DIAGNOSIS — L509 Urticaria, unspecified: Secondary | ICD-10-CM

## 2020-08-08 DIAGNOSIS — L501 Idiopathic urticaria: Secondary | ICD-10-CM | POA: Diagnosis not present

## 2020-08-08 IMAGING — CT CT ANGIOGRAPHY CHEST
2 of 7 series · 13 of 36 positions shown · IV contrast (omnipaque)
Comparison: None

CLINICAL DATA: Chest pain Terese pulmonary embolism, mediastinal
pain and discomfort with shortness of breath on exertion for 2 weeks

EXAM:
CT ANGIOGRAPHY CHEST WITH CONTRAST
TECHNIQUE: Multidetector CT imaging of the chest was performed using the
standard protocol during bolus administration of intravenous
contrast. Multiplanar CT image reconstructions and MIPs were
obtained to evaluate the vascular anatomy.
CONTRAST:  60mL OMNIPAQUE IOHEXOL 350 MG/ML SOLN IV

[Series 6: thins cta pe · axial · 0.55mm/px · z∈[-1138,-891]mm · 12 of 293 slices shown]
[im 23/293  lung]
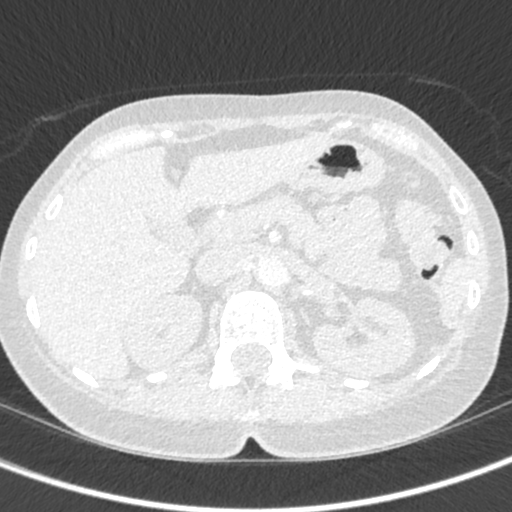
[im 45/293  mediastinal]
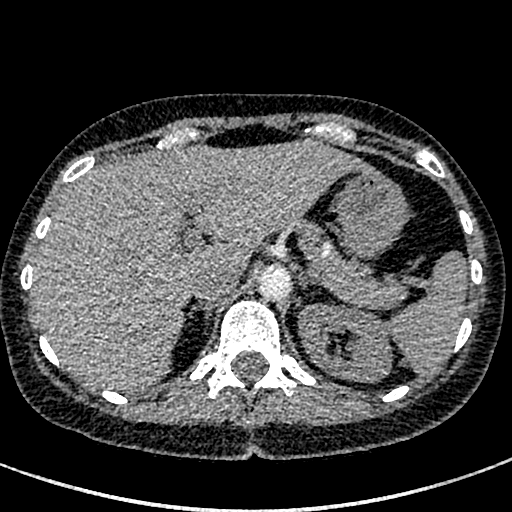
[im 68/293  lung]
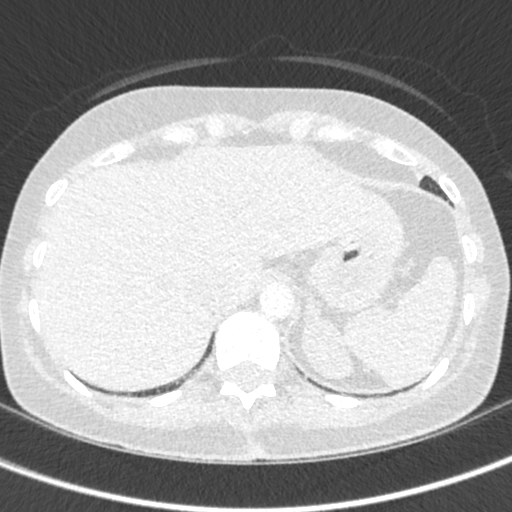
[im 90/293  mediastinal]
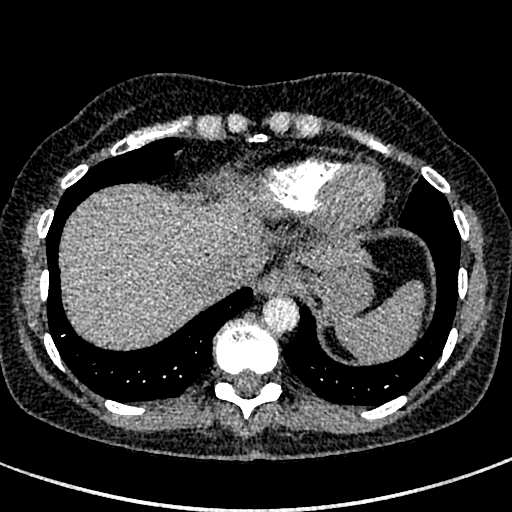
[im 113/293  lung]
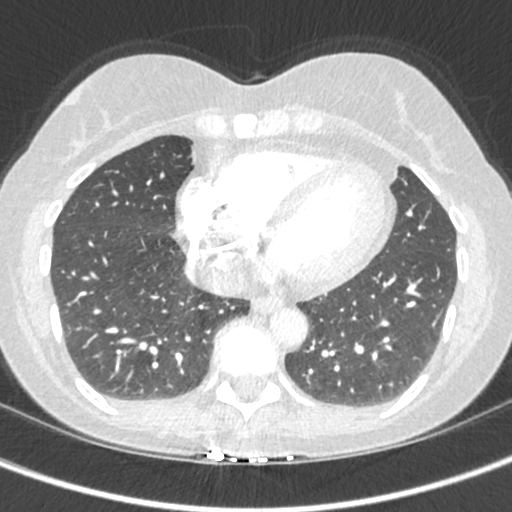
[im 135/293  mediastinal]
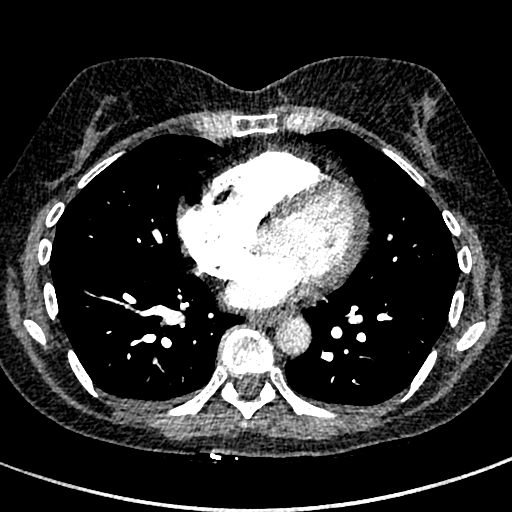
[im 158/293  lung]
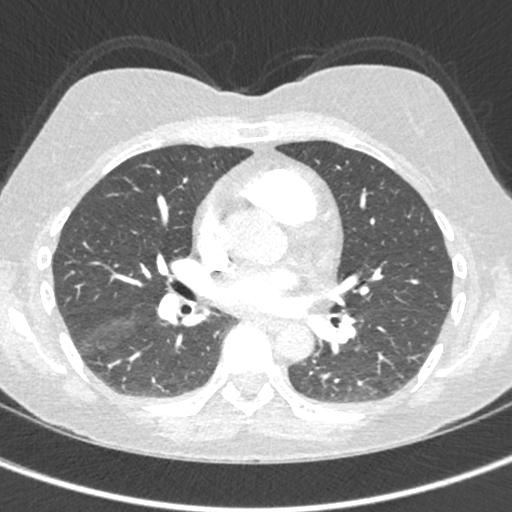
[im 180/293  mediastinal]
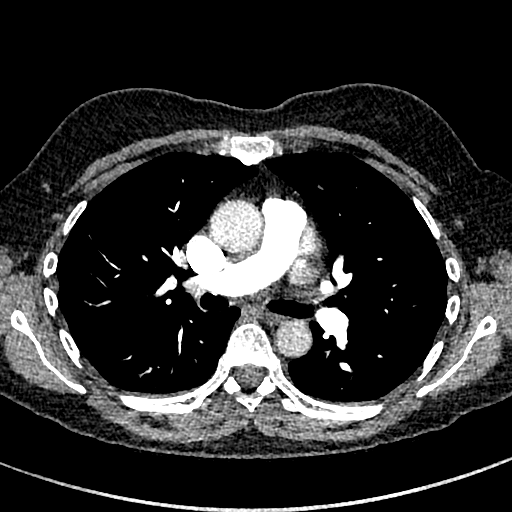
[im 203/293  lung]
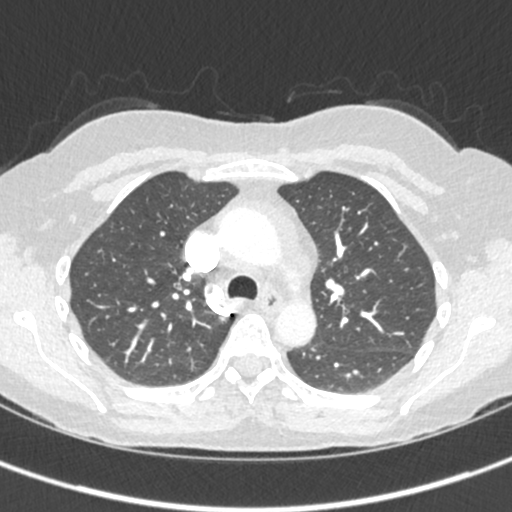
[im 225/293  mediastinal]
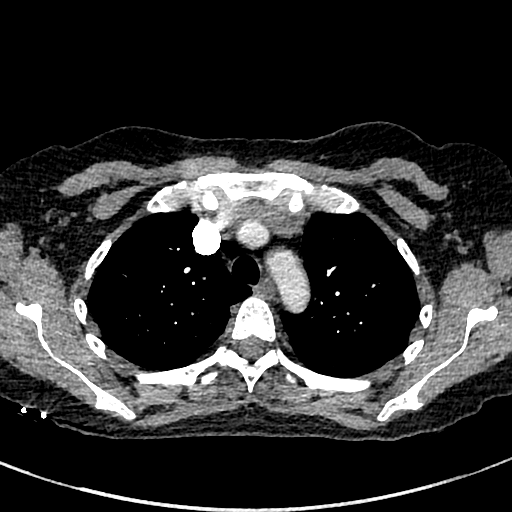
[im 248/293  lung]
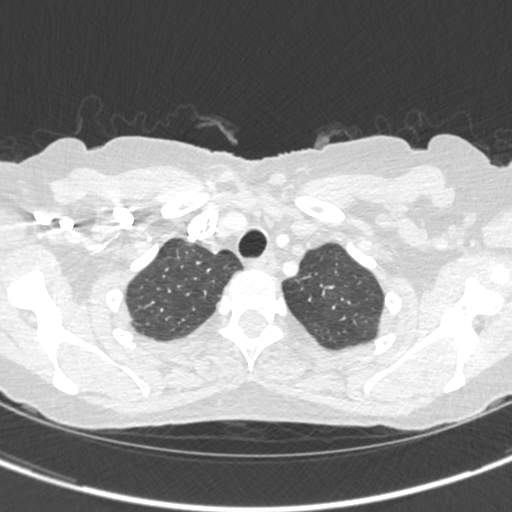
[im 270/293  mediastinal]
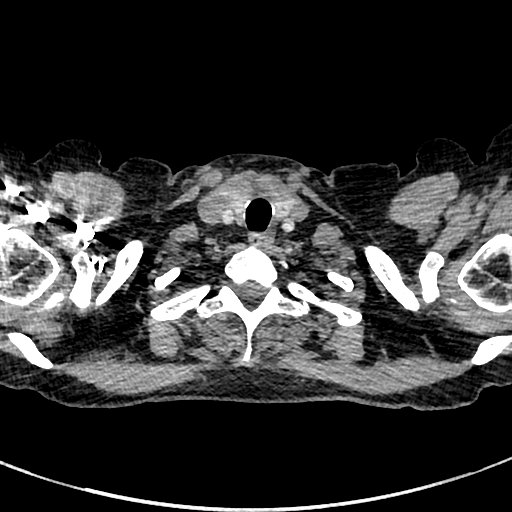

[Series 7: coronal cta pe · coronal · 0.55mm/px · 1 of 110 slices shown]
[im 55/110  mediastinal]
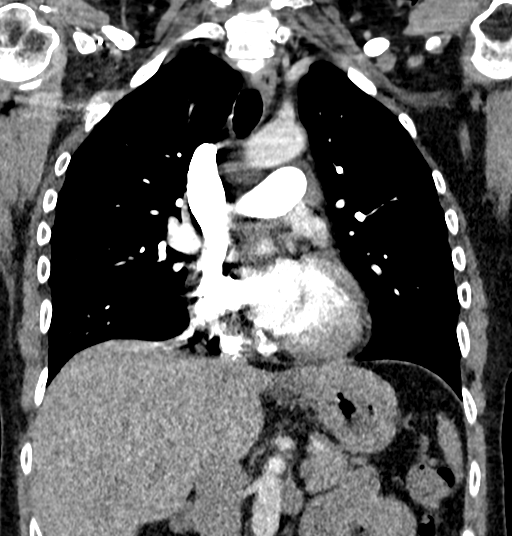

[13 of 36 positions shown; findings below may reference images not displayed]

FINDINGS: Cardiovascular: Aorta normal caliber without aneurysm or dissection.
Heart unremarkable. Pulmonary arteries well opacified and patent. No
evidence of pulmonary embolism.

Mediastinum/Nodes: Base of cervical region normal appearance.
Esophagus unremarkable. No thoracic adenopathy.

Lungs/Pleura: Lungs clear. No pulmonary infiltrate, pleural
effusion, or pneumothorax. No mass/nodule.

Upper Abdomen: Upper abdomen unremarkable

Musculoskeletal: No osseous abnormalities.

Review of the MIP images confirms the above findings.
IMPRESSION: Normal CTA chest.

No evidence of pulmonary embolism.

## 2020-08-08 MED ORDER — OMALIZUMAB 150 MG/ML ~~LOC~~ SOSY
300.0000 mg | PREFILLED_SYRINGE | Freq: Once | SUBCUTANEOUS | Status: AC
  Administered 2020-08-08: 300 mg via SUBCUTANEOUS

## 2020-08-08 NOTE — Progress Notes (Addendum)
   Follow-Up Visit   Subjective  Jodi Anderson is a 60 y.o. female who presents for the following: Follow-up (Urticaria - 1 month follow up - Xolair injection today. She had one small flare of her left antecubital.).    The following portions of the chart were reviewed this encounter and updated as appropriate:       Review of Systems:  No other skin or systemic complaints except as noted in HPI or Assessment and Plan.  Objective  Well appearing patient in no apparent distress; mood and affect are within normal limits.  A focused examination was performed including arms. Relevant physical exam findings are noted in the Assessment and Plan.    Assessment & Plan  Urticaria  Chronic idiopathic urticaria   Chronic condition with duration or expected duration over one year. Condition is bothersome to patient. Not currently at goal.   Still getting some hives in the last couple of weeks only with stress   Continue Allegra twice a day as needed for flares. Call if not responding well to therapy.   Reviewed rare risk of anaphylaxis with Xolair. Pt keeps epipen in her purse.   Xolair injections given today. 150 mg/mL injected into right and left arms. Pt tolerated well.   Blood pressure checks:  11:30 am 100/60 (right before injection) 11:45 am 94/67 12:00 am 90/70  omalizumab (XOLAIR) prefilled syringe 300 mg   Related Medications famotidine (PEPCID) 20 MG tablet Take 1 tablet (20 mg total) by mouth 2 (two) times daily.  Return in about 1 month (around 09/08/2020) for xolair injection.

## 2020-08-08 NOTE — Patient Instructions (Signed)

## 2020-08-17 ENCOUNTER — Other Ambulatory Visit: Payer: Self-pay | Admitting: Family

## 2020-09-05 ENCOUNTER — Ambulatory Visit (INDEPENDENT_AMBULATORY_CARE_PROVIDER_SITE_OTHER): Payer: 59

## 2020-09-05 ENCOUNTER — Other Ambulatory Visit: Payer: Self-pay

## 2020-09-05 DIAGNOSIS — L509 Urticaria, unspecified: Secondary | ICD-10-CM | POA: Diagnosis not present

## 2020-09-05 MED ORDER — OMALIZUMAB 150 MG/ML ~~LOC~~ SOSY
300.0000 mg | PREFILLED_SYRINGE | Freq: Once | SUBCUTANEOUS | Status: AC
  Administered 2020-09-05: 300 mg via SUBCUTANEOUS

## 2020-09-05 NOTE — Progress Notes (Signed)
Xolair injections given today. 150 mg/mL injected into right and left arms. Pt tolerated well.   BP before injection - 94/64 BP immediately after - 88/68 BP 15 min after injection - 93/64 BP 30 min after injection - 102/68  Lot: [id number] Exp: 06/2021

## 2020-09-13 ENCOUNTER — Ambulatory Visit: Payer: 59 | Admitting: Dermatology

## 2020-09-27 ENCOUNTER — Other Ambulatory Visit: Payer: Self-pay | Admitting: Family

## 2020-10-04 ENCOUNTER — Ambulatory Visit (INDEPENDENT_AMBULATORY_CARE_PROVIDER_SITE_OTHER): Payer: 59 | Admitting: Dermatology

## 2020-10-04 ENCOUNTER — Other Ambulatory Visit: Payer: Self-pay

## 2020-10-04 DIAGNOSIS — L905 Scar conditions and fibrosis of skin: Secondary | ICD-10-CM | POA: Diagnosis not present

## 2020-10-04 DIAGNOSIS — L509 Urticaria, unspecified: Secondary | ICD-10-CM

## 2020-10-04 DIAGNOSIS — L821 Other seborrheic keratosis: Secondary | ICD-10-CM

## 2020-10-04 MED ORDER — OMALIZUMAB 150 MG/ML ~~LOC~~ SOSY
300.0000 mg | PREFILLED_SYRINGE | Freq: Once | SUBCUTANEOUS | Status: AC
  Administered 2020-10-04: 300 mg via SUBCUTANEOUS

## 2020-10-04 NOTE — Progress Notes (Signed)
   Follow-Up Visit   Subjective  Jodi Anderson is a 60 y.o. female who presents for the following: Follow-up (Patient here today for urticaria follow up. Patient doing Xolair injections every 4 weeks. She is no longer taking allegra or Singulair. Patient advises she has not had any break outs since last visit. ).  The following portions of the chart were reviewed this encounter and updated as appropriate:   Tobacco  Allergies  Meds  Problems  Med Hx  Surg Hx  Fam Hx      Review of Systems:  No other skin or systemic complaints except as noted in HPI or Assessment and Plan.  Objective  Well appearing patient in no apparent distress; mood and affect are within normal limits.  A focused examination was performed including neck, shoulders, arms. Relevant physical exam findings are noted in the Assessment and Plan.  Right Upper Arm - Anterior Clear   Left neck Dyspigmented smooth macule or patch.    Assessment & Plan  Urticaria Right Upper Arm - Anterior  Chronic condition with duration or expected duration over one year. Currently well-controlled.  BP before injection - 96/66 BP immediately after - 98/68 BP 15 min after injection - 107/74 BP 30 min after injection - 91/63  Patient advised to be aware of risk of anaphylactic reaction which can be delayed after xolair injection even if she has had several injections with no problem. Continue to keep Epipen with her at all times and seek medical care if any concerns.   Xolair injections given today. 150 mg/mL injected into right and left arms. Pt tolerated well.  Continue Xolair 300mg /mL every 4 weeks.  Related Medications omalizumab ) prefilled syringe 300 mg   Scar Left neck  Vs dermatofibroma  Benign-appearing.  Observation.  Call clinic for new or changing lesion  Seborrheic Keratoses - Stuck-on, waxy, tan-brown papules and/or plaques  - Benign-appearing - Discussed benign etiology and  prognosis. - Observe - Call for any changes  Return in about 4 weeks (around 11/01/2020) for Xolair with nurse, 3 months with Dr. 01/01/2021.  Neale Burly, RMA, am acting as scribe for Anise Salvo, MD .  Documentation: I have reviewed the above documentation for accuracy and completeness, and I agree with the above.  Darden Dates, MD

## 2020-10-04 NOTE — Patient Instructions (Signed)

## 2020-10-10 ENCOUNTER — Other Ambulatory Visit: Payer: Self-pay | Admitting: Family

## 2020-10-10 DIAGNOSIS — G4700 Insomnia, unspecified: Secondary | ICD-10-CM

## 2020-10-15 ENCOUNTER — Encounter: Payer: Self-pay | Admitting: Dermatology

## 2020-10-21 ENCOUNTER — Other Ambulatory Visit: Payer: Self-pay | Admitting: Family

## 2020-10-25 ENCOUNTER — Ambulatory Visit: Payer: 59 | Attending: Internal Medicine

## 2020-10-25 ENCOUNTER — Other Ambulatory Visit: Payer: Self-pay

## 2020-10-25 DIAGNOSIS — Z23 Encounter for immunization: Secondary | ICD-10-CM

## 2020-10-25 MED ORDER — PFIZER COVID-19 VAC BIVALENT 30 MCG/0.3ML IM SUSP
INTRAMUSCULAR | 0 refills | Status: DC
  Filled 2020-10-25: qty 0.3, 1d supply, fill #0

## 2020-10-25 NOTE — Progress Notes (Signed)
   Covid-19 Vaccination Clinic  Name:  Jodi Anderson    MRN: [id number] DOB: [birth year]  10/25/2020  Jodi Anderson was observed post Covid-19 immunization for 15 minutes without incident. She was provided with Vaccine Information Sheet and instruction to access the V-Safe system.   Jodi Anderson was instructed to call 911 with any severe reactions post vaccine: Difficulty breathing  Swelling of face and throat  A fast heartbeat  A bad rash all over body  Dizziness and weakness   Immunizations Administered     Name Date Dose VIS Date Route   PFIZER Comrnaty(Gray TOP) Covid-19 Vaccine 10/25/2020  2:49 PM 0.3 mL 09/21/2020 Intramuscular   Manufacturer: [employer], [employer]   Lot: [id number]   NDC: [address]-7      Drusilla Kanner, PharmD, MBA Clinical Acute Care Pharmacist

## 2020-10-31 ENCOUNTER — Telehealth: Payer: Self-pay | Admitting: Orthopaedic Surgery

## 2020-10-31 NOTE — Telephone Encounter (Signed)
Patient called. She would like a referral to [facility] to have an injection. Her call back number is [phone]

## 2020-11-01 ENCOUNTER — Other Ambulatory Visit: Payer: Self-pay

## 2020-11-01 ENCOUNTER — Ambulatory Visit (INDEPENDENT_AMBULATORY_CARE_PROVIDER_SITE_OTHER): Payer: 59

## 2020-11-01 DIAGNOSIS — M5136 Other intervertebral disc degeneration, lumbar region: Secondary | ICD-10-CM

## 2020-11-01 DIAGNOSIS — L509 Urticaria, unspecified: Secondary | ICD-10-CM

## 2020-11-01 MED ORDER — OMALIZUMAB 150 MG/ML ~~LOC~~ SOSY
300.0000 mg | PREFILLED_SYRINGE | Freq: Once | SUBCUTANEOUS | Status: AC
  Administered 2020-11-01: 300 mg via SUBCUTANEOUS

## 2020-11-01 NOTE — Progress Notes (Signed)
g

## 2020-11-01 NOTE — Telephone Encounter (Signed)
LMOM for patient order sent for injection

## 2020-11-01 NOTE — Progress Notes (Signed)
Pt here for 1 month Xolair injections. Xolair 150 mg/mL injected into right and left arms for a total of 300 mg. Pt tolerated well.   BP before: 107/61 BP after: 98/69 BP 15 mins after: 98/69 BP 30 mins after: 100/67  Lot: [id number] Exp: 04/2021

## 2020-11-17 ENCOUNTER — Ambulatory Visit
Admission: RE | Admit: 2020-11-17 | Discharge: 2020-11-17 | Disposition: A | Discharge status: Home / Self Care | Payer: 59 | Source: Ambulatory Visit | Attending: Orthopaedic Surgery | Admitting: Orthopaedic Surgery

## 2020-11-17 ENCOUNTER — Other Ambulatory Visit: Payer: Self-pay

## 2020-11-17 DIAGNOSIS — M5136 Other intervertebral disc degeneration, lumbar region: Secondary | ICD-10-CM

## 2020-11-17 MED ORDER — IOPAMIDOL (ISOVUE-M 200) INJECTION 41%
1.0000 mL | Freq: Once | INTRAMUSCULAR | Status: AC
  Administered 2020-11-17: 1 mL via EPIDURAL

## 2020-11-17 MED ORDER — METHYLPREDNISOLONE ACETATE 40 MG/ML INJ SUSP (RADIOLOG
80.0000 mg | Freq: Once | INTRAMUSCULAR | Status: AC
  Administered 2020-11-17: 80 mg via EPIDURAL

## 2020-11-17 NOTE — Discharge Instructions (Signed)

## 2020-11-22 ENCOUNTER — Other Ambulatory Visit: Payer: Self-pay | Admitting: Dermatology

## 2020-11-22 DIAGNOSIS — L509 Urticaria, unspecified: Secondary | ICD-10-CM

## 2020-11-28 ENCOUNTER — Telehealth: Payer: Self-pay | Admitting: Family

## 2020-11-28 NOTE — Telephone Encounter (Signed)
Pt called in regards needing a medication possibly refilled. Pt received a steroid inj in back and has recently had to do a lot of physical activity involving her back. Pt states she feels as if she "threw her back out." Pt is wanting to know if she is able to have the prescription for diclofenac refilled or does she need to be seen?  [phone]

## 2020-11-29 ENCOUNTER — Other Ambulatory Visit: Payer: Self-pay

## 2020-11-29 MED ORDER — DICLOFENAC SODIUM 75 MG PO TBEC: 75.0000 mg | DELAYED_RELEASE_TABLET | Freq: Two times a day (BID) | ORAL | 1 refills | Status: DC | PRN

## 2020-11-29 NOTE — Telephone Encounter (Signed)
I can see diclofenac was refilled in september with refill You may refill for 30 days if she doesn't have

## 2020-11-29 NOTE — Telephone Encounter (Signed)
Pt stated that she already knows the issue. She has seen Dr. Ophelia Charter in [facility] & sees a neuroradiologist for back injections. She has DDD & is having sciatic nerve pain. She does have an MRI scheduled & is seeing a new physician/specialist she was unsure of name for evaluation too. She was asking for refill to get her through until she sees this provider & has MRI.

## 2020-11-29 NOTE — Telephone Encounter (Signed)
Call pt  Yes please advise appt to ensure this is related to musculoskeletal etiology not anything else going on.she may see me or a colleague.   She may also call back to orthopedics who had seen her 11/01/20 as appears epidural given in lumbar region.

## 2020-11-29 NOTE — Telephone Encounter (Signed)
I called & patient refill needed. I have sent to [facility].

## 2020-12-06 ENCOUNTER — Ambulatory Visit (INDEPENDENT_AMBULATORY_CARE_PROVIDER_SITE_OTHER): Payer: 59

## 2020-12-06 ENCOUNTER — Other Ambulatory Visit: Payer: Self-pay

## 2020-12-06 DIAGNOSIS — L509 Urticaria, unspecified: Secondary | ICD-10-CM

## 2020-12-06 MED ORDER — OMALIZUMAB 150 MG/ML ~~LOC~~ SOSY
300.0000 mg | PREFILLED_SYRINGE | Freq: Once | SUBCUTANEOUS | Status: AC
  Administered 2020-12-06: 300 mg via SUBCUTANEOUS

## 2020-12-06 NOTE — Progress Notes (Signed)
Patient here today for 4 week Xolair injection.  Xolair 150mg /mL injected into each left and right arm for a total of 300mg . Patient tolerated injections well.  BP readings: Before: 102/66 After: 100/63 15 mins after: 92/67 30 mins after: 93/70   Patient states her BP always runs low.

## 2021-01-04 ENCOUNTER — Ambulatory Visit: Payer: 59 | Admitting: Dermatology

## 2021-01-04 ENCOUNTER — Other Ambulatory Visit: Payer: Self-pay

## 2021-01-04 DIAGNOSIS — L509 Urticaria, unspecified: Secondary | ICD-10-CM | POA: Diagnosis not present

## 2021-01-04 DIAGNOSIS — I781 Nevus, non-neoplastic: Secondary | ICD-10-CM

## 2021-01-04 MED ORDER — OMALIZUMAB 150 MG/ML ~~LOC~~ SOSY
150.0000 mg | PREFILLED_SYRINGE | Freq: Once | SUBCUTANEOUS | Status: AC
  Administered 2021-01-04: 15:00:00 150 mg via SUBCUTANEOUS

## 2021-01-04 NOTE — Progress Notes (Signed)
° °  Follow-Up Visit   Subjective  Jodi Anderson is a 60 y.o. female who presents for the following: Follow-up (Patient here today for Xolair injections and urticaria follow up. Patient doing well on Xolair, no outbreaks.No other treatment at this time. ).  The following portions of the chart were reviewed this encounter and updated as appropriate:   Tobacco   Allergies   Meds   Problems   Med Hx   Surg Hx   Fam Hx       Review of Systems:  No other skin or systemic complaints except as noted in HPI or Assessment and Plan.  Objective  Well appearing patient in no apparent distress; mood and affect are within normal limits.  A focused examination was performed including bilateral arms, face. Relevant physical exam findings are noted in the Assessment and Plan.  Right Upper Arm - Anterior Clear   left nasal sidewall Telangiectasia without features suspicious for malignancy on dermoscopy     Assessment & Plan  Urticaria Right Upper Arm - Anterior  Chronic condition with duration or expected duration over one year. Currently well-controlled.   BP before injection - 94/62 BP immediately after - 92/60 BP 15 min after injection - 104/60 BP 30 min after injection -    Patient advised to be aware of risk of anaphylactic reaction which can be delayed after xolair injection even if she has had several injections with no problem. Continue to keep Epipen with her at all times and seek medical care if any concerns.    Xolair injections given today. 150 mg/mL injected into right and left arms. Pt tolerated well.   Continue Xolair 300mg /mL every 4 weeks. If patient continues to stay clear will plan to continue injections through April 2023.  Related Medications omalizumab May 2023) prefilled syringe 150 mg   omalizumab Geoffry Paradise) prefilled syringe 150 mg   Telangiectasia left nasal sidewall  Benign-appearing.  Observation.  Call clinic for new or changing lesions.  Recommend daily  use of broad spectrum spf 30+ sunscreen to sun-exposed areas.     Return in about 4 weeks (around 02/01/2021) for Xolair injection with nurse.  04/01/2021, RMA, am acting as scribe for Anise Salvo, MD .   Documentation: I have reviewed the above documentation for accuracy and completeness, and I agree with the above.  Darden Dates, MD

## 2021-01-04 NOTE — Patient Instructions (Signed)

## 2021-01-04 NOTE — Progress Notes (Signed)
[name] [address] [place] [address] Phone: [phone] Subjective:   Bruce Donath, am serving as a scribe for Dr. Antoine Primas.  This visit occurred during the SARS-CoV-2 public health emergency.  Safety protocols were in place, including screening questions prior to the visit, additional usage of staff PPE, and extensive cleaning of exam room while observing appropriate contact time as indicated for disinfecting solutions.    I'm seeing this patient by the request  of:  Allegra Grana, FNP  CC: Neck and low back pain  BPZ:[id number]  Jodi Anderson is a 60 y.o. female coming in with complaint of neck and lower back pain. Has been having epidural injections for years from Dr. Ophelia Charter and Dr. Margaretha Sheffield. Patient states that epidurals have not been helping as much recently. Pain radiates down L leg to foot. Patient also wonders if the pain in lower leg is coming more from her knee. Uses dry needling for pain relief as well. NSAIDs do decrease her pain but wants to discuss other options. Patient has has been doing PT that was taught to her by a neighborhood PT. Exercises have been helping.    Reviewed patient's chart and patient did have an MRI of the lumbar spine in 2018.  MRI did show advanced L5-S1 degenerative disc disease with bilateral foraminal stenosis. Patient's MRI of the cervical spine also independently visualized by me showing moderate right foraminal narrowing at C4-C6 with advanced facet arthropathy. No past medical history on file. Past Surgical History:  Procedure Laterality Date   CESAREAN SECTION     3x   tummy tuck     VAGINAL DELIVERY     Social History   Socioeconomic History   Marital status: Married    Spouse name: Not on file   Number of children: Not on file   Years of education: Not on file   Highest education level: Not on file  Occupational History   Not on file  Tobacco Use   Smoking status: Never    Smokeless tobacco: Never  Vaping Use   Vaping Use: Never used  Substance and Sexual Activity   Alcohol use: Yes   Drug use: No   Sexual activity: Not on file  Other Topics Concern   Not on file  Social History Narrative   Lives in [place]. 4 children    1 son in program at [employer] has autism, 14 years old, lives in home in [facility]. Has dog and 3 cats.      Work - homemaker      Diet - regular diet      Exercise - walks daily, weights, trampoline   Social Determinants of Health   Financial Resource Strain: Not on file  Food Insecurity: Not on file  Transportation Needs: Not on file  Physical Activity: Not on file  Stress: Not on file  Social Connections: Not on file   Allergies  Allergen Reactions   Codeine Nausea Only   Family History  Problem Relation Age of Onset   Epilepsy Son    Cancer Paternal Grandmother        breast   Cancer Maternal Aunt        lung     Current Outpatient Medications (Cardiovascular):    EPINEPHrine (EPIPEN 2-PAK) 0.3 mg/0.3 mL IJ SOAJ injection, Inject into thigh once as needed for anaphylaxis/allergic reaction with difficulty breathing. Seek emergency medical care if you have needed to use this  Current  Outpatient Medications (Respiratory):    fexofenadine (ALLEGRA) 180 MG tablet, TAKE 1 TABLET BY MOUTH DAILY   montelukast (SINGULAIR) 10 MG tablet, TAKE 1 TABLET BY MOUTH DAILY FOR HIVES   XOLAIR 150 MG/ML prefilled syringe, INJECT 300MG  SUBCUTANEOUSLY EVERY 4 WEEKS  Current Outpatient Medications (Analgesics):    diclofenac (VOLTAREN) 75 MG EC tablet, Take 1 tablet (75 mg total) by mouth 2 (two) times daily as needed.   Current Outpatient Medications (Other):    calcium-vitamin D (OSCAL WITH D) 500-200 MG-UNIT tablet, Take 1 tablet by mouth daily.   COVID-19 mRNA bivalent vaccine, Pfizer, (PFIZER COVID-19 VAC BIVALENT) injection, Inject into the muscle.   COVID-19 mRNA Vac-TriS, Pfizer, (PFIZER-BIONT COVID-19 VAC-TRIS) SUSP injection,  Inject into the muscle.   Ivermectin (SOOLANTRA) 1 % CREA, Apply 1 application topically daily as needed.   Magnesium 500 MG TABS, Take 1 tablet by mouth daily.   metroNIDAZOLE (METROCREAM) 0.75 % cream, Apply topically 2 (two) times daily. At face   Oxymetazoline HCl (RHOFADE) 1 % CREA, Apply QAM to entire face   triamcinolone (KENALOG) 0.1 %, Apply 1 application topically 2 (two) times daily.   triamcinolone cream (KENALOG) 0.1 %, Apply to affected areas with eczema BID as needed for up to two weeks at a time. Avoid applying to face, groin, and axilla. Use as directed. Risk of skin atrophy with long-term use reviewed.   vitamin C (ASCORBIC ACID) 500 MG tablet, Take 500 mg by mouth daily.   zolpidem (AMBIEN) 10 MG tablet, TAKE 1 TABLET BY MOUTH AT BEDTIME AS NEEDED FOR SLEEP   gabapentin (NEURONTIN) 100 MG capsule, Take 2 capsules (200 mg total) by mouth at bedtime.   Reviewed prior external information including notes and imaging from  primary care provider As well as notes that were available from care everywhere and other healthcare systems.  Past medical history, social, surgical and family history all reviewed in electronic medical record.  No pertanent information unless stated regarding to the chief complaint.   Review of Systems:  No headache, visual changes, nausea, vomiting, diarrhea, constipation, dizziness, abdominal pain, skin rash, fevers, chills, night sweats, weight loss, swollen lymph nodes, body aches, joint swelling, chest pain, shortness of breath, mood changes. POSITIVE muscle aches  Objective  Blood pressure 102/68, pulse 63, height 5\' 2"  (1.575 m), weight 129 lb (58.5 kg), last menstrual period 06/23/2014, SpO2 99 %.   General: No apparent distress alert and oriented x3 mood and affect normal, dressed appropriately.  HEENT: Pupils equal, extraocular movements intact  Respiratory: Patient's speak in full sentences and does not appear short of breath  Cardiovascular:  No lower extremity edema, non tender, no erythema  Gait normal with good balance and coordination.  MSK:   Neck exam shows patient does have some loss of lordosis.  Patient does have some limited sidebending bilaterally.  Negative Spurling's.  Low back exam does have tightness noted in the lumbar spine.  No midline tenderness.  Tenderness over the sacroiliac joints bilaterally.  Worsening pain with extension greater than 5 degrees.  Mild tightness with straight leg test but no true radicular symptoms.  Patient was uncomfortable with testing.  Osteopathic findings C4 flexed rotated and side bent left T7 extended rotated and side bent right.  Inhaled rib L1 flexed rotated and side right Sacrum right on right    Impression and Recommendations:     The above documentation has been reviewed and is accurate and complete , DO

## 2021-01-05 ENCOUNTER — Encounter: Payer: Self-pay | Admitting: Family Medicine

## 2021-01-05 ENCOUNTER — Ambulatory Visit: Payer: 59 | Admitting: Family Medicine

## 2021-01-05 ENCOUNTER — Ambulatory Visit (INDEPENDENT_AMBULATORY_CARE_PROVIDER_SITE_OTHER): Payer: 59

## 2021-01-05 ENCOUNTER — Other Ambulatory Visit: Payer: Self-pay

## 2021-01-05 ENCOUNTER — Telehealth: Payer: Self-pay | Admitting: Family Medicine

## 2021-01-05 VITALS — BP 102/68 | HR 63 | Ht 62.0 in | Wt 129.0 lb

## 2021-01-05 DIAGNOSIS — M9904 Segmental and somatic dysfunction of sacral region: Secondary | ICD-10-CM

## 2021-01-05 DIAGNOSIS — E538 Deficiency of other specified B group vitamins: Secondary | ICD-10-CM | POA: Diagnosis not present

## 2021-01-05 DIAGNOSIS — M9901 Segmental and somatic dysfunction of cervical region: Secondary | ICD-10-CM

## 2021-01-05 DIAGNOSIS — M2550 Pain in unspecified joint: Secondary | ICD-10-CM

## 2021-01-05 DIAGNOSIS — M25561 Pain in right knee: Secondary | ICD-10-CM | POA: Diagnosis not present

## 2021-01-05 DIAGNOSIS — M9903 Segmental and somatic dysfunction of lumbar region: Secondary | ICD-10-CM | POA: Diagnosis not present

## 2021-01-05 DIAGNOSIS — M5136 Other intervertebral disc degeneration, lumbar region: Secondary | ICD-10-CM | POA: Diagnosis not present

## 2021-01-05 DIAGNOSIS — M25562 Pain in left knee: Secondary | ICD-10-CM | POA: Diagnosis not present

## 2021-01-05 DIAGNOSIS — M9908 Segmental and somatic dysfunction of rib cage: Secondary | ICD-10-CM | POA: Diagnosis not present

## 2021-01-05 DIAGNOSIS — M9902 Segmental and somatic dysfunction of thoracic region: Secondary | ICD-10-CM

## 2021-01-05 LAB — COMPREHENSIVE METABOLIC PANEL
ALT: 16 U/L (ref 0–35)
AST: 18 U/L (ref 0–37)
Albumin: 4.3 g/dL (ref 3.5–5.2)
Alkaline Phosphatase: 55 U/L (ref 39–117)
BUN: 18 mg/dL (ref 6–23)
CO2: 32 mEq/L (ref 19–32)
Calcium: 9.8 mg/dL (ref 8.4–10.5)
Chloride: 102 mEq/L (ref 96–112)
Creatinine, Ser: 0.62 mg/dL (ref 0.40–1.20)
GFR: 97.18 mL/min (ref >60.00)
Glucose, Bld: 94 mg/dL (ref 70–99)
Potassium: 4.1 mEq/L (ref 3.5–5.1)
Sodium: 141 mEq/L (ref 135–145)
Total Bilirubin: 0.6 mg/dL (ref 0.2–1.2)
Total Protein: 6.8 g/dL (ref 6.0–8.3)

## 2021-01-05 LAB — VITAMIN D 25 HYDROXY (VIT D DEFICIENCY, FRACTURES): VITD: 23.85 ng/mL — ABNORMAL LOW (ref 30.00–100.00)

## 2021-01-05 LAB — CBC WITH DIFFERENTIAL/PLATELET
Basophils Absolute: 0 10*3/uL (ref 0.0–0.1)
Basophils Relative: 0.7 % (ref 0.0–3.0)
Eosinophils Absolute: 0.1 10*3/uL (ref 0.0–0.7)
Eosinophils Relative: 1.7 % (ref 0.0–5.0)
HCT: 38.8 % (ref 36.0–46.0)
Hemoglobin: 12.9 g/dL (ref 12.0–15.0)
Lymphocytes Relative: 36.8 % (ref 12.0–46.0)
Lymphs Abs: 1.6 10*3/uL (ref 0.7–4.0)
MCHC: 33.2 g/dL (ref 30.0–36.0)
MCV: 91.3 fl (ref 78.0–100.0)
Monocytes Absolute: 0.5 10*3/uL (ref 0.1–1.0)
Monocytes Relative: 11.9 % (ref 3.0–12.0)
Neutro Abs: 2.2 10*3/uL (ref 1.4–7.7)
Neutrophils Relative %: 48.9 % (ref 43.0–77.0)
Platelets: 265 10*3/uL (ref 150.0–400.0)
RBC: 4.25 Mil/uL (ref 3.87–5.11)
RDW: 13.1 % (ref 11.5–15.5)
WBC: 4.4 10*3/uL (ref 4.0–10.5)

## 2021-01-05 LAB — VITAMIN B12: Vitamin B-12: >1550 pg/mL — ABNORMAL HIGH (ref 211–911)

## 2021-01-05 LAB — FERRITIN: Ferritin: 55 ng/mL (ref 10.0–291.0)

## 2021-01-05 LAB — FOLATE: Folate: 7.9 ng/mL (ref >5.9)

## 2021-01-05 LAB — SEDIMENTATION RATE: Sed Rate: 3 mm/hr (ref 0–30)

## 2021-01-05 LAB — URIC ACID: Uric Acid, Serum: 3.7 mg/dL (ref 2.4–7.0)

## 2021-01-05 MED ORDER — GABAPENTIN 100 MG PO CAPS: 200.0000 mg | ORAL_CAPSULE | Freq: Every day | ORAL | 0 refills | Status: DC

## 2021-01-05 MED ORDER — CYANOCOBALAMIN 1000 MCG/ML IJ SOLN
1000.0000 ug | Freq: Once | INTRAMUSCULAR | Status: AC
  Administered 2021-01-05: 1000 ug via INTRAMUSCULAR

## 2021-01-05 NOTE — Assessment & Plan Note (Signed)
Patient's advanced imaging has shown the patient does have degenerative disc disease.  Patient has had significant lumbar

## 2021-01-05 NOTE — Telephone Encounter (Signed)
Rx called in for gabapentin per a verbal from Dr. Katrinka Blazing.

## 2021-01-05 NOTE — Telephone Encounter (Signed)
Pt states she was supposed to start Gabapentin and Vit D ( I am assuming RX Vit D) based on today's visit.  I do not see these being called in yet, she requests [facility] and would like to start today if possible.

## 2021-01-05 NOTE — Patient Instructions (Addendum)
B knee xray B12 injection today HOKA or OOFOS sandals in the house See me in 6 weeks

## 2021-01-06 ENCOUNTER — Telehealth: Payer: Self-pay | Admitting: Family Medicine

## 2021-01-06 DIAGNOSIS — M9903 Segmental and somatic dysfunction of lumbar region: Secondary | ICD-10-CM | POA: Insufficient documentation

## 2021-01-06 NOTE — Telephone Encounter (Signed)
Pt says she mentioned Arthotec to Dr. Katrinka Blazing. She used to take for her foot pain and found it helpful.  Can she take this with her gabapentin? She is struggling a bit and just started the gabapentin.  If OK, she will need an rx called in.

## 2021-01-06 NOTE — Telephone Encounter (Signed)
Responded to pt via MyChart

## 2021-01-06 NOTE — Assessment & Plan Note (Signed)

## 2021-01-07 LAB — PTH, INTACT AND CALCIUM
Calcium: 9.8 mg/dL (ref 8.6–10.4)
PTH: 29 pg/mL (ref 16–77)

## 2021-01-09 MED ORDER — DICLOFENAC-MISOPROSTOL 50-0.2 MG PO TBEC: 1.0000 | DELAYED_RELEASE_TABLET | Freq: Two times a day (BID) | ORAL | 0 refills | Status: DC | PRN

## 2021-01-09 NOTE — Telephone Encounter (Signed)
Sent patient MyChart message.

## 2021-01-10 ENCOUNTER — Encounter: Payer: Self-pay | Admitting: Dermatology

## 2021-01-19 ENCOUNTER — Encounter: Payer: Self-pay | Admitting: Family Medicine

## 2021-01-26 ENCOUNTER — Telehealth: Payer: Self-pay

## 2021-01-26 ENCOUNTER — Other Ambulatory Visit: Payer: Self-pay

## 2021-01-26 MED ORDER — GABAPENTIN 300 MG PO CAPS: 300.0000 mg | ORAL_CAPSULE | Freq: Every day | ORAL | 0 refills | Status: DC

## 2021-01-26 NOTE — Telephone Encounter (Signed)
Patient calling for a refill of the gabapentin patient needs a new refill sent in of the 300mg  to her pharmacy so that they will refill it. They will not refill it on the other prescription because it shows she is not due for a refill. [facility] in [place].

## 2021-01-26 NOTE — Telephone Encounter (Signed)
Rx filled per a verbal from Dr. Smith.  

## 2021-01-31 ENCOUNTER — Ambulatory Visit (INDEPENDENT_AMBULATORY_CARE_PROVIDER_SITE_OTHER): Payer: 59

## 2021-01-31 ENCOUNTER — Other Ambulatory Visit: Payer: Self-pay

## 2021-01-31 DIAGNOSIS — L509 Urticaria, unspecified: Secondary | ICD-10-CM | POA: Diagnosis not present

## 2021-01-31 MED ORDER — OMALIZUMAB 150 MG/ML ~~LOC~~ SOSY
300.0000 mg | PREFILLED_SYRINGE | Freq: Once | SUBCUTANEOUS | Status: AC
  Administered 2021-01-31: 300 mg via SUBCUTANEOUS

## 2021-01-31 NOTE — Progress Notes (Signed)
Patient here today for Xolair injection. Xolair 150mg /mL X2 injected into right and left arm. Patient tolerated well.  LOT: EXP: JUN 2023  BP before injection: 99/66 15 mins after injection: 102/70 30 mins after injection: 91/62

## 2021-02-01 ENCOUNTER — Telehealth: Payer: Self-pay | Admitting: Family Medicine

## 2021-02-01 NOTE — Telephone Encounter (Signed)
Pt is struggling with numbness in her foot related to her sciatica. She has an upcoming appt to discuss epidurals but asking if we could go ahead and order the epidural, unless we have another suggestion.  She is taking her meds as prescribed but unable to take her daily walks.

## 2021-02-01 NOTE — Telephone Encounter (Signed)
Sent MyChart message to patient requesting most recent MRI report.

## 2021-02-14 NOTE — Progress Notes (Signed)
[name] [address] [place] [address] Phone: [phone] Subjective:   Bruce Donath, am serving as a scribe for Dr. Antoine Primas. This visit occurred during the SARS-CoV-2 public health emergency.  Safety protocols were in place, including screening questions prior to the visit, additional usage of staff PPE, and extensive cleaning of exam room while observing appropriate contact time as indicated for disinfecting solutions.  I'm seeing this patient by the request  of:  Allegra Grana, FNP  CC: Back and neck pain follow-up  [id number]  MAXCINE STRONG is a 61 y.o. female coming in with complaint of back and neck pain. OMT on 01/05/2021.  Patient unfortunately did have an exacerbation of his sciatica and was having more numbness going on in the foot.  Patient states that her pain is from L knee down to top of her foot. Did increase gabapentin to 300mg  but this did not help.   Medications patient has been prescribed: Gabapentin and diclofenac  Taking:         Reviewed prior external information including notes and imaging from previsou exam, outside providers and external EMR if available.   As well as notes that were available from care everywhere and other healthcare systems.  Past medical history, social, surgical and family history all reviewed in electronic medical record.  No pertanent information unless stated regarding to the chief complaint.   No past medical history on file.  Allergies  Allergen Reactions   Codeine Nausea Only     Review of Systems:  No headache, visual changes, nausea, vomiting, diarrhea, constipation, dizziness, abdominal pain, skin rash, fevers, chills, night sweats, weight loss, swollen lymph nodes, body aches, joint swelling, chest pain, shortness of breath, mood changes. POSITIVE muscle aches  Objective  Blood pressure 102/64, pulse 76, height 5\' 2"  (1.575 m), weight 133 lb (60.3 kg), last  menstrual period 06/23/2014, SpO2 97 %.   General: No apparent distress alert and oriented x3 mood and affect normal, dressed appropriately.  HEENT: Pupils equal, extraocular movements intact  Respiratory: Patient's speak in full sentences and does not appear short of breath  Cardiovascular: No lower extremity edema, non tender, no erythema  Leg exam shows the patient is still tender to palpation on the anterior lateral aspect of the tibia area.  Patient has mild pain over the fibular head.  Patient does have tenderness over the midfoot that is fairly severe.  Rigid midfoot noted.  Breakdown of the transverse arch noted.  Limited muscular skeletal ultrasound was performed and interpreted by , M  Limited ultrasound of patient's midfoot shows significant arthritic changes noted with hypoechoic changes consistent with some mild effusion.  No cortical irregularity to extremity stress reaction.  Patient does have what appears to be varicose veins within the anterior tibialis muscle proximally in the area where patient does have pain.  Fully compressible.  Procedure: Real-time Ultrasound Guided Injection of left midfoot Device: GE Logiq Q7 Ultrasound guided injection is preferred based studies that show increased duration, increased effect, greater accuracy, decreased procedural pain, increased response rate, and decreased cost with ultrasound guided versus blind injection.  Verbal informed consent obtained.  Time-out conducted.  Noted no overlying erythema, induration, or other signs of local infection.  Skin prepped in a sterile fashion.  Local anesthesia: Topical Ethyl chloride.  With sterile technique and under real time ultrasound guidance: With a 25-gauge half inch needle injecting 0.5 cc of 0.5% Marcaine and 0.5 cc of  Kenalog 40 mg/mL. Completed without difficulty  Pain immediately resolved suggesting accurate placement of the medication.  Advised to call if fevers/chills,  erythema, induration, drainage, or persistent bleeding.  Impression: Technically successful ultrasound guided injection.      Assessment and Plan:        The above documentation has been reviewed and is accurate and complete Judi Saa, DO       Note: This dictation was prepared with Dragon dictation along with smaller phrase technology. Any transcriptional errors that result from this process are unintentional.

## 2021-02-15 ENCOUNTER — Ambulatory Visit: Payer: Self-pay

## 2021-02-15 ENCOUNTER — Ambulatory Visit (INDEPENDENT_AMBULATORY_CARE_PROVIDER_SITE_OTHER): Payer: 59 | Admitting: Family Medicine

## 2021-02-15 ENCOUNTER — Encounter: Payer: Self-pay | Admitting: Family Medicine

## 2021-02-15 ENCOUNTER — Other Ambulatory Visit: Payer: Self-pay

## 2021-02-15 VITALS — BP 102/64 | HR 76 | Ht 62.0 in | Wt 133.0 lb

## 2021-02-15 DIAGNOSIS — M19079 Primary osteoarthritis, unspecified ankle and foot: Secondary | ICD-10-CM | POA: Diagnosis not present

## 2021-02-15 DIAGNOSIS — G8929 Other chronic pain: Secondary | ICD-10-CM

## 2021-02-15 DIAGNOSIS — M25562 Pain in left knee: Secondary | ICD-10-CM

## 2021-02-15 DIAGNOSIS — M5136 Other intervertebral disc degeneration, lumbar region: Secondary | ICD-10-CM

## 2021-02-15 NOTE — Patient Instructions (Addendum)
Injected foot today [facility] See me in 3-4 weeks Avoid being barefoot Can consider injection in knee at f/u

## 2021-02-15 NOTE — Assessment & Plan Note (Signed)
Patient given injection today and tolerated the procedure well.  Patient was walking better and more appropriately immediately..  Can consider the possibility of further evaluation of the mild varicose vein noted.  Follow-up with me again 4 to 6 weeks to further evaluate.  Discussed the possibility of custom orthotics.

## 2021-02-15 NOTE — Assessment & Plan Note (Signed)
Known arthritic changes but not making significant improvement with the injections.  Did not feel that the manipulation made significant improvement as well.  Also improved and increase the gabapentin with no improvement.  Patient's pain seem to be more localized around the foot and patient was found to have mild midfoot arthritis.  Given injection in the foot today and we will see how patient responds.  Depending on this patient could be a candidate for possible PRP injections.  Patient still having pain on the anterior aspect of the leg and was found to have more of a varicose vein that could be contributing and may need to be further evaluated with ABIs.  Patient will follow-up with me again in 4 weeks otherwise.

## 2021-02-20 ENCOUNTER — Other Ambulatory Visit: Payer: Self-pay | Admitting: Family

## 2021-02-20 DIAGNOSIS — G4700 Insomnia, unspecified: Secondary | ICD-10-CM

## 2021-02-28 ENCOUNTER — Ambulatory Visit (INDEPENDENT_AMBULATORY_CARE_PROVIDER_SITE_OTHER): Payer: 59

## 2021-02-28 ENCOUNTER — Other Ambulatory Visit: Payer: Self-pay

## 2021-02-28 DIAGNOSIS — L509 Urticaria, unspecified: Secondary | ICD-10-CM | POA: Diagnosis not present

## 2021-02-28 MED ORDER — OMALIZUMAB 150 MG/ML ~~LOC~~ SOSY
300.0000 mg | PREFILLED_SYRINGE | Freq: Once | SUBCUTANEOUS | Status: AC
  Administered 2021-02-28: 300 mg via SUBCUTANEOUS

## 2021-02-28 NOTE — Progress Notes (Signed)
Patient here today for four week Xolair injections.   Xolair 150mg /mL X2 injected into each right and left arm. Patient tolerated well.  1st BP: 111/69 BP at 15 mins: 100/69 BP at : 102/64  LOT: EXP: 10/2021  Dr. 11/2021 is okay with patient continuing nurse visits for March and April. After April's injections Dr. May would like for patient to d/c. If patient flares at anytime from May moving forward, she can call and schedule nurse visit again and then schedule follow up with Dr. June 3 months from that point. If patient does not flare after April, follow up is not needed.

## 2021-03-03 NOTE — Progress Notes (Signed)
Jodi Anderson [name] [address] [place] Phone: [phone] Subjective:   [name], am serving as a scribe for Dr. Hulan Saas. This visit occurred during the SARS-CoV-2 public health emergency.  Safety protocols were in place, including screening questions prior to the visit, additional usage of staff PPE, and extensive cleaning of exam room while observing appropriate contact time as indicated for disinfecting solutions.   I'm seeing this patient by the request  of:  Burnard Hawthorne, FNP  CC: left knee pain, foot pain   [id number]  02/15/2021 Patient given injection today and tolerated the procedure well.  Patient was walking better and more appropriately immediately..  Can consider the possibility of further evaluation of the mild varicose vein noted.  Follow-up with me again 4 to 6 weeks to further evaluate.  Discussed the possibility of custom orthotics.  Known arthritic changes but not making significant improvement with the injections.  Did not feel that the manipulation made significant improvement as well.  Also improved and increase the gabapentin with no improvement.  Patient's pain seem to be more localized around the foot and patient was found to have mild midfoot arthritis.  Given injection in the foot today and we will see how patient responds.  Depending on this patient could be a candidate for possible PRP injections.  Patient still having pain on the anterior aspect of the leg and was found to have more of a varicose vein that could be contributing and may need to be further evaluated with ABIs.  Patient will follow-up with me again in 4 weeks otherwise.  Updated 03/06/2021 Jodi Anderson is a 61 y.o. female coming in with complaint of knee pain injection in foot helped a bit. Whats the next step. No new complaints.       No past medical history on file. Past Surgical History:  Procedure Laterality Date   CESAREAN  SECTION     3x   tummy tuck     VAGINAL DELIVERY     Social History   Socioeconomic History   Marital status: Married    Spouse name: Not on file   Number of children: Not on file   Years of education: Not on file   Highest education level: Not on file  Occupational History   Not on file  Tobacco Use   Smoking status: Never   Smokeless tobacco: Never  Vaping Use   Vaping Use: Never used  Substance and Sexual Activity   Alcohol use: Yes   Drug use: No   Sexual activity: Not on file  Other Topics Concern   Not on file  Social History Narrative   Lives in [place]. 4 children    1 son in program at [employer] has autism, 74 years old, lives in home in [facility]. Has dog and 3 cats.      Work - homemaker      Diet - regular diet      Exercise - walks daily, weights, trampoline   Social Determinants of Health   Financial Resource Strain: Not on file  Food Insecurity: Not on file  Transportation Needs: Not on file  Physical Activity: Not on file  Stress: Not on file  Social Connections: Not on file   Allergies  Allergen Reactions   Codeine Nausea Only   Family History  Problem Relation Age of Onset   Epilepsy Son    Cancer Paternal Grandmother  breast   Cancer Maternal Aunt        lung     Current Outpatient Medications (Cardiovascular):    EPINEPHrine (EPIPEN 2-PAK) 0.3 mg/0.3 mL IJ SOAJ injection, Inject into thigh once as needed for anaphylaxis/allergic reaction with difficulty breathing. Seek emergency medical care if you have needed to use this  Current Outpatient Medications (Respiratory):    fexofenadine (ALLEGRA) 180 MG tablet, TAKE 1 TABLET BY MOUTH DAILY   montelukast (SINGULAIR) 10 MG tablet, TAKE 1 TABLET BY MOUTH DAILY FOR HIVES   XOLAIR 150 MG/ML prefilled syringe, INJECT 300MG  SUBCUTANEOUSLY EVERY 4 WEEKS  Current Outpatient Medications (Analgesics):    diclofenac (VOLTAREN) 75 MG EC tablet, Take 1 tablet (75 mg total) by mouth 2 (two) times  daily as needed.   Diclofenac-miSOPROStol 50-0.2 MG TBEC, Take 1 tablet by mouth 2 (two) times daily as needed.   Current Outpatient Medications (Other):    calcium-vitamin D (OSCAL WITH D) 500-200 MG-UNIT tablet, Take 1 tablet by mouth daily.   COVID-19 mRNA bivalent vaccine, Pfizer, (PFIZER COVID-19 VAC BIVALENT) injection, Inject into the muscle.   COVID-19 mRNA Vac-TriS, Pfizer, (PFIZER-BIONT COVID-19 VAC-TRIS) SUSP injection, Inject into the muscle.   gabapentin (NEURONTIN) 300 MG capsule, Take 1 capsule (300 mg total) by mouth at bedtime.   Ivermectin (SOOLANTRA) 1 % CREA, Apply 1 application topically daily as needed.   Magnesium 500 MG TABS, Take 1 tablet by mouth daily.   metroNIDAZOLE (METROCREAM) 0.75 % cream, Apply topically 2 (two) times daily. At face   Oxymetazoline HCl (RHOFADE) 1 % CREA, Apply QAM to entire face   triamcinolone (KENALOG) 0.1 %, Apply 1 application topically 2 (two) times daily.   triamcinolone cream (KENALOG) 0.1 %, Apply to affected areas with eczema BID as needed for up to two weeks at a time. Avoid applying to face, groin, and axilla. Use as directed. Risk of skin atrophy with long-term use reviewed.   vitamin C (ASCORBIC ACID) 500 MG tablet, Take 500 mg by mouth daily.   zolpidem (AMBIEN) 10 MG tablet, TAKE 1 TABLET BY MOUTH AT BEDTIME AS NEEDED FOR SLEEP   Reviewed prior external information including notes and imaging from  primary care provider As well as notes that were available from care everywhere and other healthcare systems.  Past medical history, social, surgical and family history all reviewed in electronic medical record.  No pertanent information unless stated regarding to the chief complaint.   Review of Systems:  No headache, visual changes, nausea, vomiting, diarrhea, constipation, dizziness, abdominal pain, skin rash, fevers, chills, night sweats, weight loss, swollen lymph nodes, body aches, joint swelling, chest pain, shortness of  breath, mood changes. POSITIVE muscle aches  Objective  Blood pressure 106/74, pulse 67, height 5\' 2"  (1.575 m), weight 133 lb (60.3 kg), last menstrual period 06/23/2014, SpO2 96 %.   General: No apparent distress alert and oriented x3 mood and affect normal, dressed appropriately.  HEENT: Pupils equal, extraocular movements intact  Respiratory: Patient's speak in full sentences and does not appear short of breath  Cardiovascular: No lower extremity edema, non tender, no erythema  Gait normal with good balance and coordination.  MSK:  left leg pain noted.  Tightness noted with straight leg test.  Seems to make the pain worse in the foot and the lateral knee.  Patient also has pain over the midfoot but not as severe as it was last time.  States though that when we do push between the third and fourth  metatarsals has some increasing pain as well.   Limited muscular skeletal ultrasound was performed and interpreted by Hulan Saas, M  Limited ultrasound shows patient does have still the arthritic changes noted of the midfoot but no significant hypoechoic changes at this moment.  And actually an improvement from previous exam. Impression: Improvement in the hypoechoic changes of the midfoot arthritis after injection.   Impression and Recommendations:     The above documentation has been reviewed and is accurate and complete Lyndal Pulley, DO

## 2021-03-06 ENCOUNTER — Ambulatory Visit: Payer: 59 | Admitting: Family Medicine

## 2021-03-06 ENCOUNTER — Other Ambulatory Visit: Payer: Self-pay

## 2021-03-06 ENCOUNTER — Ambulatory Visit: Payer: Self-pay

## 2021-03-06 ENCOUNTER — Encounter: Payer: Self-pay | Admitting: Family Medicine

## 2021-03-06 VITALS — BP 106/74 | HR 67 | Ht 62.0 in | Wt 133.0 lb

## 2021-03-06 DIAGNOSIS — M9903 Segmental and somatic dysfunction of lumbar region: Secondary | ICD-10-CM | POA: Diagnosis not present

## 2021-03-06 DIAGNOSIS — M19079 Primary osteoarthritis, unspecified ankle and foot: Secondary | ICD-10-CM | POA: Diagnosis not present

## 2021-03-06 DIAGNOSIS — M25562 Pain in left knee: Secondary | ICD-10-CM | POA: Diagnosis not present

## 2021-03-06 DIAGNOSIS — M5416 Radiculopathy, lumbar region: Secondary | ICD-10-CM | POA: Diagnosis not present

## 2021-03-06 NOTE — Assessment & Plan Note (Signed)
Likely lumbar radiculopathy.  Patient is not making any improvement.  Very difficult to find why patient continues to have pain.  Very minimal improvement with the injection in the midfoot. Ultrasound does not show any significant findings at the moment.  Patient is continuing on the gabapentin.  Do feel that further imaging is necessary at this point to see if there is a nerve impingement that could be contributing.  We also discussed the possibility of nerve conduction study but will start with the MRI for further evaluation.  Pain is affecting daily activities and waking her up even at night.  Unable to increase activity secondary to the discomfort and pain.  Discussed icing regimen and home exercises, discussed which activities to do which ones to avoid.  Increase activity slowly.  Follow-up with me again in 6 to 8 weeks.

## 2021-03-06 NOTE — Patient Instructions (Addendum)
Lets see what's going on Thanks for the cookies [facility] [phone] Call Today  When we receive your results we will contact you.

## 2021-03-06 NOTE — Assessment & Plan Note (Signed)
Mild improvement noted.  Could be secondary to more radicular symptoms at this point.  We will get MRI of the back to further evaluate and will discuss further.

## 2021-03-07 ENCOUNTER — Ambulatory Visit: Payer: 59 | Admitting: Family Medicine

## 2021-03-08 ENCOUNTER — Ambulatory Visit
Admission: RE | Admit: 2021-03-08 | Discharge: 2021-03-08 | Disposition: A | Discharge status: Home / Self Care | Payer: 59 | Source: Ambulatory Visit | Attending: Family Medicine | Admitting: Family Medicine

## 2021-03-08 DIAGNOSIS — M9903 Segmental and somatic dysfunction of lumbar region: Secondary | ICD-10-CM

## 2021-03-09 ENCOUNTER — Other Ambulatory Visit: Payer: Self-pay

## 2021-03-09 ENCOUNTER — Other Ambulatory Visit: Payer: Self-pay | Admitting: Family Medicine

## 2021-03-09 ENCOUNTER — Telehealth: Payer: Self-pay | Admitting: Family Medicine

## 2021-03-09 DIAGNOSIS — M545 Low back pain, unspecified: Secondary | ICD-10-CM

## 2021-03-09 NOTE — Telephone Encounter (Signed)
Pt read MRI results and Dr. Michaelle Copas comment.  Question: Does he recommend the L sided epidural OR the Nerve ablation they discussed at a previous visit?  She will do whichever he recommends, please advise her and OK to order.

## 2021-03-09 NOTE — Telephone Encounter (Signed)
Order placed and patient notified 

## 2021-03-10 ENCOUNTER — Telehealth: Payer: Self-pay | Admitting: Family Medicine

## 2021-03-10 ENCOUNTER — Other Ambulatory Visit: Payer: 59

## 2021-03-10 NOTE — Telephone Encounter (Signed)
Patient called asking if Dr Katrinka Blazing would be able to send in Diclofenac 75mg  coated tablets instead of the Diclofenac-misoprostol 50-0.2mg . She said that the 50-0.2mg  is a non coated tablet and it bothers her stomach. She has taken the 75mg  before and felt that it helped.  Please advise.

## 2021-03-13 ENCOUNTER — Other Ambulatory Visit: Payer: Self-pay | Admitting: Family Medicine

## 2021-03-14 NOTE — Telephone Encounter (Signed)
Rx filled per a verbal from Dr. Smith.  

## 2021-03-15 ENCOUNTER — Other Ambulatory Visit: Payer: Self-pay | Admitting: Family

## 2021-03-15 DIAGNOSIS — Z1231 Encounter for screening mammogram for malignant neoplasm of breast: Secondary | ICD-10-CM

## 2021-03-17 ENCOUNTER — Telehealth: Payer: Self-pay

## 2021-03-17 MED ORDER — DIAZEPAM 5 MG PO TABS: ORAL_TABLET | ORAL | 0 refills | Status: DC

## 2021-03-17 NOTE — Addendum Note (Signed)
Addended by: Judi Saa on: 03/17/2021 04:03 PM   Modules accepted: Orders

## 2021-03-17 NOTE — Telephone Encounter (Signed)
Done

## 2021-03-17 NOTE — Telephone Encounter (Signed)
Called patient to inform her that prescription was filled

## 2021-03-17 NOTE — Telephone Encounter (Signed)
Patient called asking if she could get Valium sent to her pharmacy for the procedure she is having at [facility] imaging Monday   [facility]

## 2021-03-20 ENCOUNTER — Telehealth: Payer: Self-pay | Admitting: Family Medicine

## 2021-03-20 ENCOUNTER — Ambulatory Visit
Admission: RE | Admit: 2021-03-20 | Discharge: 2021-03-20 | Disposition: A | Discharge status: Home / Self Care | Payer: 59 | Source: Ambulatory Visit | Attending: Family Medicine | Admitting: Family Medicine

## 2021-03-20 ENCOUNTER — Other Ambulatory Visit: Payer: Self-pay

## 2021-03-20 ENCOUNTER — Other Ambulatory Visit: Payer: Self-pay | Admitting: Family Medicine

## 2021-03-20 DIAGNOSIS — M545 Low back pain, unspecified: Secondary | ICD-10-CM

## 2021-03-20 NOTE — Telephone Encounter (Signed)
Error - duplicate

## 2021-03-20 NOTE — Telephone Encounter (Signed)
Pt had Facet injection today, Dr. Charise Killian has recommended a L5 nerve root block ( see his notes on today's procedure).  If Dr. Katrinka Blazing agrees, pt would like this ordered as Dr. Charise Killian can perform it on March 16.  If ok, please enter order and notify pt.

## 2021-03-20 NOTE — Discharge Instructions (Signed)

## 2021-03-21 ENCOUNTER — Other Ambulatory Visit: Payer: Self-pay

## 2021-03-21 DIAGNOSIS — M5416 Radiculopathy, lumbar region: Secondary | ICD-10-CM

## 2021-03-21 NOTE — Telephone Encounter (Signed)
Yes I think that is a great idea  Please order

## 2021-03-21 NOTE — Telephone Encounter (Signed)
Ordered and patient notified.  

## 2021-04-03 ENCOUNTER — Ambulatory Visit: Payer: 59

## 2021-04-04 ENCOUNTER — Other Ambulatory Visit: Payer: Self-pay

## 2021-04-04 ENCOUNTER — Ambulatory Visit (INDEPENDENT_AMBULATORY_CARE_PROVIDER_SITE_OTHER): Payer: 59 | Admitting: Dermatology

## 2021-04-04 ENCOUNTER — Ambulatory Visit: Payer: 59

## 2021-04-04 ENCOUNTER — Other Ambulatory Visit: Payer: Self-pay | Admitting: Family Medicine

## 2021-04-04 DIAGNOSIS — L509 Urticaria, unspecified: Secondary | ICD-10-CM

## 2021-04-04 MED ORDER — OMALIZUMAB 150 MG/ML ~~LOC~~ SOSY
300.0000 mg | PREFILLED_SYRINGE | Freq: Once | SUBCUTANEOUS | Status: AC
  Administered 2021-04-04: 300 mg via SUBCUTANEOUS

## 2021-04-04 NOTE — Progress Notes (Signed)
Patient here for four week Xolair injection. Xolair 300mg  injected into each arm, left and right. Patient tolerated well.  ?Patient to do one more month of injections. If she flares after April injections we are to schedule her back for a nurse visit then schedule her a 3 month follow up with Dr. May (also in last office note).  ? ?LOT:  Neale [id number] ?EXP: 01/2022 ? ?Blood Pressure before injections: 93/61 ?15 minutes after: 94/64 ?30 minutes after: 92/63 ? ?Patient to follow up on nurse schedule in 4 weeks. ? ?06-06-2001, RMA ? ?Documentation: I have reviewed the above documentation for accuracy and completeness, and I agree with the above. ? ?Dorathy Daft, MD ? ? ?

## 2021-04-05 ENCOUNTER — Encounter: Payer: Self-pay | Admitting: Dermatology

## 2021-04-06 ENCOUNTER — Ambulatory Visit
Admission: RE | Admit: 2021-04-06 | Discharge: 2021-04-06 | Disposition: A | Discharge status: Home / Self Care | Payer: 59 | Source: Ambulatory Visit | Attending: Family Medicine | Admitting: Family Medicine

## 2021-04-06 ENCOUNTER — Telehealth: Payer: Self-pay | Admitting: Family Medicine

## 2021-04-06 ENCOUNTER — Other Ambulatory Visit: Payer: Self-pay

## 2021-04-06 MED ORDER — IOPAMIDOL (ISOVUE-M 200) INJECTION 41%
1.0000 mL | Freq: Once | INTRAMUSCULAR | Status: AC
  Administered 2021-04-06: 1 mL via EPIDURAL

## 2021-04-06 MED ORDER — METHYLPREDNISOLONE ACETATE 40 MG/ML INJ SUSP (RADIOLOG
80.0000 mg | Freq: Once | INTRAMUSCULAR | Status: AC
  Administered 2021-04-06: 80 mg via EPIDURAL

## 2021-04-06 NOTE — Telephone Encounter (Signed)
Order placed. Patient notified.  

## 2021-04-06 NOTE — Telephone Encounter (Signed)
Pt had nerve root block with Dr. Charise Killian today at [facility]. Requesting we order another one as provider recommended a 2nd procedure 3 weeks after today's.  ?

## 2021-04-06 NOTE — Discharge Instructions (Signed)

## 2021-04-14 ENCOUNTER — Other Ambulatory Visit: Payer: Self-pay | Admitting: Dermatology

## 2021-04-14 DIAGNOSIS — L509 Urticaria, unspecified: Secondary | ICD-10-CM

## 2021-04-19 ENCOUNTER — Telehealth: Payer: Self-pay | Admitting: Family Medicine

## 2021-04-19 NOTE — Telephone Encounter (Signed)
Pt having procedure at [facility] 4/6, that provider recommends a 10 mg valium prior to procedure. ?Please call in to [facility] ?

## 2021-04-20 MED ORDER — DIAZEPAM 5 MG PO TABS: ORAL_TABLET | ORAL | 0 refills | Status: DC

## 2021-04-20 NOTE — Telephone Encounter (Signed)
Done, sent in

## 2021-04-25 ENCOUNTER — Ambulatory Visit
Admission: RE | Admit: 2021-04-25 | Discharge: 2021-04-25 | Disposition: A | Discharge status: Home / Self Care | Payer: 59 | Source: Ambulatory Visit | Attending: Family | Admitting: Family

## 2021-04-25 DIAGNOSIS — Z1231 Encounter for screening mammogram for malignant neoplasm of breast: Secondary | ICD-10-CM | POA: Diagnosis present

## 2021-04-26 ENCOUNTER — Ambulatory Visit
Admission: RE | Admit: 2021-04-26 | Discharge: 2021-04-26 | Disposition: A | Discharge status: Home / Self Care | Payer: 59 | Source: Ambulatory Visit | Attending: Family Medicine | Admitting: Family Medicine

## 2021-04-26 ENCOUNTER — Other Ambulatory Visit: Payer: 59

## 2021-04-26 DIAGNOSIS — M5416 Radiculopathy, lumbar region: Secondary | ICD-10-CM

## 2021-04-26 MED ORDER — IOPAMIDOL (ISOVUE-M 200) INJECTION 41%
1.0000 mL | Freq: Once | INTRAMUSCULAR | Status: AC
  Administered 2021-04-26: 1 mL via EPIDURAL

## 2021-04-26 MED ORDER — METHYLPREDNISOLONE ACETATE 40 MG/ML INJ SUSP (RADIOLOG
80.0000 mg | Freq: Once | INTRAMUSCULAR | Status: AC
  Administered 2021-04-26: 80 mg via EPIDURAL

## 2021-04-26 NOTE — Discharge Instructions (Signed)

## 2021-05-02 ENCOUNTER — Telehealth: Payer: Self-pay | Admitting: Family Medicine

## 2021-05-02 MED ORDER — DIAZEPAM 5 MG PO TABS
ORAL_TABLET | ORAL | 0 refills | Status: DC
Start: 2021-05-02 — End: 2021-06-27

## 2021-05-02 NOTE — Telephone Encounter (Signed)
Refilled the valium ?Please order the injection  ?

## 2021-05-02 NOTE — Telephone Encounter (Signed)
Per pt, Dr. Charise Killian wants to do the 3rd Nerve Root block the first week of May. Please order this at [facility] as per the previous 2. ? ?Also, pt will need valium for this procedure called into [facility] ? ?She has a follow up with [place] three week after this final procedure. ?

## 2021-05-03 ENCOUNTER — Telehealth: Payer: Self-pay

## 2021-05-03 ENCOUNTER — Other Ambulatory Visit: Payer: Self-pay

## 2021-05-03 DIAGNOSIS — M5416 Radiculopathy, lumbar region: Secondary | ICD-10-CM

## 2021-05-03 NOTE — Telephone Encounter (Signed)
Injection ordered

## 2021-05-03 NOTE — Telephone Encounter (Signed)
Pt notified that injection ordered and valium for procedure refilled. ?

## 2021-05-03 NOTE — Telephone Encounter (Signed)
Patient had a change in insurance and will not be able to get the Xolair shipped until May when her insurance changes. Advised patient that we can use samples and replace those with her prescription when it comes in May. Patient will keep her appointment for 05/09/21 for Xolair injection.  ?

## 2021-05-09 ENCOUNTER — Ambulatory Visit (INDEPENDENT_AMBULATORY_CARE_PROVIDER_SITE_OTHER): Payer: Self-pay | Admitting: Dermatology

## 2021-05-09 ENCOUNTER — Telehealth: Payer: Self-pay | Admitting: Family

## 2021-05-09 DIAGNOSIS — G4700 Insomnia, unspecified: Secondary | ICD-10-CM

## 2021-05-09 DIAGNOSIS — L509 Urticaria, unspecified: Secondary | ICD-10-CM

## 2021-05-09 MED ORDER — OMALIZUMAB 150 MG/ML ~~LOC~~ SOSY
300.0000 mg | PREFILLED_SYRINGE | Freq: Once | SUBCUTANEOUS | Status: AC
  Administered 2021-05-09: 300 mg via SUBCUTANEOUS

## 2021-05-09 NOTE — Telephone Encounter (Signed)
Pt need a refill on Ambien and does not know if she needs to make an appointment for it. Pt has new insurance that starts may 1st- [place]. Pt needs another ekg and blood work because of cosmetic surgery July 11th.  ?

## 2021-05-09 NOTE — Progress Notes (Signed)
Patient here for four week Xolair injection. Xolair 300mg  injected into each arm, left and right. Patient tolerated well.  ?This the last month of injections. If she flares after April injections we are to schedule her back for a nurse visit and then schedule her a 3 month follow up with Dr. Laurence Ferrari. ?  ?LOT:  [id number] ?EXP: 04/2021 ?  ?Blood Pressure before injections: 87/62 ?15 minutes after: 88/62 ?30 minutes after: 98/63 ?  ?Johnsie Kindred, RMA ?Documentation: I have reviewed the above documentation for accuracy and completeness, and I agree with the above. ? ?Sarina Ser, MD ? ?

## 2021-05-09 NOTE — Telephone Encounter (Signed)
Pt called in again about refill on medication (zolpidem (AMBIEN) 10 MG tablet).... Pt requesting callback ?

## 2021-05-12 MED ORDER — ZOLPIDEM TARTRATE 10 MG PO TABS: 10.0000 mg | ORAL_TABLET | Freq: Every evening | ORAL | 2 refills | Status: DC | PRN

## 2021-05-12 NOTE — Telephone Encounter (Signed)
Pt scheduled 05/22/21

## 2021-05-12 NOTE — Telephone Encounter (Signed)
Call pt ?Refilled ambien ?Ask pt to request refill through mychart refill as faster and more direct  ?Make her aware that she needs appt EVERY 3 MONTHS for me to prescribe ambien ? ?She has an appt in may she needs to keep ? ?I looked up patient on [place] Controlled Substances Reporting System PMP AWARE and saw no activity that raised concern of inappropriate use.  ? ?

## 2021-05-16 ENCOUNTER — Ambulatory Visit: Payer: 59 | Admitting: Family Medicine

## 2021-05-16 ENCOUNTER — Encounter: Payer: Self-pay | Admitting: Dermatology

## 2021-05-22 ENCOUNTER — Telehealth (INDEPENDENT_AMBULATORY_CARE_PROVIDER_SITE_OTHER): Payer: Managed Care, Other (non HMO) | Admitting: Family

## 2021-05-22 ENCOUNTER — Encounter: Payer: Self-pay | Admitting: Family

## 2021-05-22 DIAGNOSIS — G4700 Insomnia, unspecified: Secondary | ICD-10-CM | POA: Diagnosis not present

## 2021-05-22 DIAGNOSIS — R051 Acute cough: Secondary | ICD-10-CM

## 2021-05-22 DIAGNOSIS — R059 Cough, unspecified: Secondary | ICD-10-CM | POA: Insufficient documentation

## 2021-05-22 NOTE — Progress Notes (Signed)
Virtual Visit via Video Note ? ?I connected with@ ? on 05/22/21 at  4:00 PM EDT by a video enabled telemedicine application and verified that I am speaking with the correct person using two identifiers. ? Location patient: home ?Location provider:work  ?Persons participating in the virtual visit: patient, provider ? ?I discussed the limitations of evaluation and management by telemedicine and the availability of in person appointments. The patient expressed understanding and agreed to proceed. ? ? ?HPI: ?She complains of nonproductive cough, hoarseness, x 2 days, unchanged. Endorses dull ha ?No fever, sob, ear pain ?Covid negative ? ?Insomnia- she is taking ambien 1/3 tablet and able to sleep well . ? ?She has upcoming cosmetic procedure 07/2021 ? ?ROS: See pertinent positives and negatives per HPI. ? ? ? ?EXAM: ? ?VITALS per patient if applicable: ?LMP 06/23/2014 (Approximate)  ?BP Readings from Last 3 Encounters:  ?04/26/21 98/65  ?04/06/21 99/63  ?03/20/21 101/71  ? ?Wt Readings from Last 3 Encounters:  ?03/06/21 133 lb (60.3 kg)  ?02/15/21 133 lb (60.3 kg)  ?01/05/21 129 lb (58.5 kg)  ? ? ?GENERAL: alert, oriented, appears well and in no acute distress ? ?HEENT: atraumatic, conjunttiva clear, no obvious abnormalities on inspection of external nose and ears ? ?NECK: normal movements of the head and neck ? ?LUNGS: on inspection no signs of respiratory distress, breathing rate appears normal, no obvious gross SOB, gasping or wheezing ? ?CV: no obvious cyanosis ? ?MS: moves all visible extremities without noticeable abnormality ? ?PSYCH/NEURO: pleasant and cooperative, no obvious depression or anxiety, speech and thought processing grossly intact ? ?ASSESSMENT AND PLAN: ? ?Discussed the following assessment and plan: ? ?Problem List Items Addressed This Visit   ? ?  ? Other  ? Cough  ?  Afebrile. No acute respiratory distress. Duration 2 days.  Counseled patient URI is likely viral allergic.  Advised this may try  Mucinex DM for cough, congestion.  She has Flonase at home and also advised that she could try Flonase first to see if postnasal drip is causing hoarseness, cough.  Advised to retest for COVID in 4 days to ensure not false negative .advised her if any worsening of symptoms or any new concerns to call me and let me know ? ?  ?  ? Insomnia  ?  Chronic, stable.  Continue Ambien 10mg  ? ?  ?  ? ? ?-we discussed possible serious and likely etiologies, options for evaluation and workup, limitations of telemedicine visit vs in person visit, treatment, treatment risks and precautions. Pt prefers to treat via telemedicine empirically rather then risking or undertaking an in person visit at this moment.  ?. ?  ?I discussed the assessment and treatment plan with the patient. The patient was provided an opportunity to ask questions and all were answered. The patient agreed with the plan and demonstrated an understanding of the instructions. ?  ?The patient was advised to call back or seek an in-person evaluation if the symptoms worsen or if the condition fails to improve as anticipated. ? ? ? , FNP  ?

## 2021-05-22 NOTE — Assessment & Plan Note (Signed)
Chronic, stable.  Continue Ambien 10 mg 

## 2021-05-22 NOTE — Patient Instructions (Addendum)
You may re test for covid in 4 days to ensure negative ?You may trial mucinex DM, also could try flonase. ? ?Plenty of water ? ?Please let me know how you are doing and most certainly if congestion, cough doesn't resolve.  ? ? ? ? ?

## 2021-05-22 NOTE — Assessment & Plan Note (Signed)
Afebrile. No acute respiratory distress. Duration 2 days.  Counseled patient URI is likely viral allergic.  Advised this may try Mucinex DM for cough, congestion.  She has Flonase at home and also advised that she could try Flonase first to see if postnasal drip is causing hoarseness, cough.  Advised to retest for COVID in 4 days to ensure not false negative .advised her if any worsening of symptoms or any new concerns to call me and let me know ?

## 2021-05-22 NOTE — Progress Notes (Signed)
Patient wants to know if she has had all covid vaccines??? Cosmetic surgery is July 11th wants to know when she needs to come in before. ?

## 2021-05-24 ENCOUNTER — Ambulatory Visit
Admission: RE | Admit: 2021-05-24 | Discharge: 2021-05-24 | Disposition: A | Discharge status: Home / Self Care | Payer: Managed Care, Other (non HMO) | Source: Ambulatory Visit | Attending: Family Medicine | Admitting: Family Medicine

## 2021-05-24 DIAGNOSIS — M5416 Radiculopathy, lumbar region: Secondary | ICD-10-CM

## 2021-05-24 MED ORDER — METHYLPREDNISOLONE ACETATE 40 MG/ML INJ SUSP (RADIOLOG
80.0000 mg | Freq: Once | INTRAMUSCULAR | Status: AC
  Administered 2021-05-24: 80 mg via EPIDURAL

## 2021-05-24 MED ORDER — IOPAMIDOL (ISOVUE-M 200) INJECTION 41%
1.0000 mL | Freq: Once | INTRAMUSCULAR | Status: AC
  Administered 2021-05-24: 1 mL via EPIDURAL

## 2021-05-24 NOTE — Discharge Instructions (Signed)

## 2021-05-29 ENCOUNTER — Other Ambulatory Visit: Payer: Self-pay | Admitting: Family

## 2021-05-29 ENCOUNTER — Encounter: Payer: Self-pay | Admitting: Family

## 2021-05-29 ENCOUNTER — Telehealth: Payer: Self-pay | Admitting: Family

## 2021-05-29 DIAGNOSIS — R051 Acute cough: Secondary | ICD-10-CM

## 2021-05-29 MED ORDER — AMOXICILLIN-POT CLAVULANATE 875-125 MG PO TABS: 1.0000 | ORAL_TABLET | Freq: Two times a day (BID) | ORAL | 0 refills | Status: AC

## 2021-05-29 NOTE — Progress Notes (Signed)
close

## 2021-05-29 NOTE — Telephone Encounter (Signed)
Patient called and stated she spoke to Arnett last week, 5/1/203 about her cough. This weekend the mucus turned green. She would like to know if Arnett could call something in.  ?

## 2021-05-30 NOTE — Telephone Encounter (Signed)
Patient responded after I sent mychart ?

## 2021-06-13 NOTE — Progress Notes (Unsigned)
[name] [address] [place] [address] Phone: [phone] Subjective:   Jodi Anderson, am serving as a scribe for Dr. Antoine Primas.   I'm seeing this patient by the request  of:  Allegra Grana, FNP  CC: low back pain   [id number]  03/06/2021 Likely lumbar radiculopathy.  Patient is not making any improvement.  Very difficult to find why patient continues to have pain.  Very minimal improvement with the injection in the midfoot. Ultrasound does not show any significant findings at the moment.  Patient is continuing on the gabapentin.  Do feel that further imaging is necessary at this point to see if there is a nerve impingement that could be contributing.  We also discussed the possibility of nerve conduction study but will start with the MRI for further evaluation.  Pain is affecting daily activities and waking her up even at night.  Unable to increase activity secondary to the discomfort and pain.  Discussed icing regimen and home exercises, discussed which activities to do which ones to avoid.  Increase activity slowly.  Follow-up with me again in 6 to 8 weeks.  Update 06/14/2021 Jodi Anderson is a 61 y.o. female coming in with complaint of lumbar spine pain. Nerve root injection on 05/24/2021. Patient states that she got about 10% more relief after injection. Is happy as she feels like she is 75% improvement. No pain in L leg but it feels like it is asleep.       No past medical history on file. Past Surgical History:  Procedure Laterality Date   CESAREAN SECTION     3x   tummy tuck     VAGINAL DELIVERY     Social History   Socioeconomic History   Marital status: Married    Spouse name: Not on file   Number of children: Not on file   Years of education: Not on file   Highest education level: Not on file  Occupational History   Not on file  Tobacco Use   Smoking status: Never   Smokeless tobacco: Never  Vaping Use    Vaping Use: Never used  Substance and Sexual Activity   Alcohol use: Yes   Drug use: No   Sexual activity: Not on file  Other Topics Concern   Not on file  Social History Narrative   Lives in [place]. 4 children    1 son in program at [employer] has autism, 25 years old, lives in home in [facility] [place] [facility]. Has dog and 3 cats.      Work - homemaker      Diet - regular diet      Exercise - walks daily, weights, trampoline   Social Determinants of Health   Financial Resource Strain: Not on file  Food Insecurity: Not on file  Transportation Needs: Not on file  Physical Activity: Not on file  Stress: Not on file  Social Connections: Not on file   Allergies  Allergen Reactions   Codeine Nausea Only   Family History  Problem Relation Age of Onset   Epilepsy Son    Cancer Paternal Grandmother        breast   Cancer Maternal Aunt        lung     Current Outpatient Medications (Cardiovascular):    EPINEPHrine (EPIPEN 2-PAK) 0.3 mg/0.3 mL IJ SOAJ injection, Inject into thigh once as needed for anaphylaxis/allergic reaction with difficulty breathing. Seek emergency medical care if you  have needed to use this  Current Outpatient Medications (Respiratory):    fexofenadine (ALLEGRA) 180 MG tablet, TAKE 1 TABLET BY MOUTH DAILY   montelukast (SINGULAIR) 10 MG tablet, TAKE 1 TABLET BY MOUTH DAILY FOR HIVES   XOLAIR 150 MG/ML prefilled syringe, INJECT 300MG  SUBCUTANEOUSLY  EVERY 4 WEEKS  Current Outpatient Medications (Analgesics):    diclofenac (VOLTAREN) 75 MG EC tablet, Take 1 tablet (75 mg total) by mouth 2 (two) times daily.   Diclofenac-miSOPROStol 50-0.2 MG TBEC, Take 1 tablet by mouth 2 (two) times daily as needed.   Current Outpatient Medications (Other):    calcium-vitamin D (OSCAL WITH D) 500-200 MG-UNIT tablet, Take 1 tablet by mouth daily.   COVID-19 mRNA bivalent vaccine, Pfizer, (PFIZER COVID-19 VAC BIVALENT) injection, Inject into the muscle.   COVID-19 mRNA Vac-TriS, Pfizer,  (PFIZER-BIONT COVID-19 VAC-TRIS) SUSP injection, Inject into the muscle.   diazepam (VALIUM) 5 MG tablet, One tab by mouth, 2 hours before procedure.   gabapentin (NEURONTIN) 100 MG capsule, Take 2 capsules (200 mg total) by mouth 2 (two) times daily.   Ivermectin (SOOLANTRA) 1 % CREA, Apply 1 application topically daily as needed.   Magnesium 500 MG TABS, Take 1 tablet by mouth daily.   metroNIDAZOLE (METROCREAM) 0.75 % cream, Apply topically 2 (two) times daily. At face   Oxymetazoline HCl (RHOFADE) 1 % CREA, Apply QAM to entire face   triamcinolone (KENALOG) 0.1 %, Apply 1 application topically 2 (two) times daily.   triamcinolone cream (KENALOG) 0.1 %, Apply to affected areas with eczema BID as needed for up to two weeks at a time. Avoid applying to face, groin, and axilla. Use as directed. Risk of skin atrophy with long-term use reviewed.   vitamin C (ASCORBIC ACID) 500 MG tablet, Take 500 mg by mouth daily.   zolpidem (AMBIEN) 10 MG tablet, Take 1 tablet (10 mg total) by mouth at bedtime as needed. for sleep    Objective  Blood pressure 100/72, pulse 74, height 5\' 2"  (1.575 m), weight 124 lb (56.2 kg), last menstrual period 06/23/2014, SpO2 98 %.   General: No apparent distress alert and oriented x3 mood and affect normal, dressed appropriately.  HEENT: Pupils equal, extraocular movements intact  Respiratory: Patient's speak in full sentences and does not appear short of breath  Cardiovascular: No lower extremity edema, non tender, no erythema  Gait normal with good balance and coordination.  MSK: Patient is able to get out of a chair without any significant difficulty.  Patient is able to stand on 1 leg without the positive Trendelenburg patient is able to have more strength and is significantly more comfortable than previous exam    Impression and Recommendations:    The above documentation has been reviewed and is accurate and complete , DO

## 2021-06-14 ENCOUNTER — Ambulatory Visit: Payer: Commercial Managed Care - HMO | Admitting: Family Medicine

## 2021-06-14 ENCOUNTER — Encounter: Payer: Self-pay | Admitting: Family Medicine

## 2021-06-14 DIAGNOSIS — M5416 Radiculopathy, lumbar region: Secondary | ICD-10-CM

## 2021-06-14 MED ORDER — DICLOFENAC SODIUM 75 MG PO TBEC: 75.0000 mg | DELAYED_RELEASE_TABLET | Freq: Two times a day (BID) | ORAL | 1 refills | Status: DC

## 2021-06-14 MED ORDER — GABAPENTIN 100 MG PO CAPS: 200.0000 mg | ORAL_CAPSULE | Freq: Two times a day (BID) | ORAL | 1 refills | Status: DC

## 2021-06-14 NOTE — Patient Instructions (Addendum)
Happy you are better Refilled meds -Gabapentin take 0-3 pills at night Will check with Dr. Lennox Laity See me when you need me

## 2021-06-14 NOTE — Assessment & Plan Note (Signed)
Patient doing significantly better after 3 injections.  Hopefully patient will continue to do this well.  Refilled gabapentin so patient can titrate down from 300 mg at night and he is more intermittently.  We discussed use it is still the anti-inflammatory as well when needed and using a 7-day burst.  As long as patient does well otherwise can follow-up with me as needed.

## 2021-06-27 ENCOUNTER — Encounter: Payer: Self-pay | Admitting: Family

## 2021-06-27 ENCOUNTER — Ambulatory Visit (INDEPENDENT_AMBULATORY_CARE_PROVIDER_SITE_OTHER): Payer: Commercial Managed Care - HMO | Admitting: Family

## 2021-06-27 VITALS — BP 112/68 | HR 83 | Ht 62.0 in | Wt 123.2 lb

## 2021-06-27 DIAGNOSIS — Z01818 Encounter for other preprocedural examination: Secondary | ICD-10-CM

## 2021-06-27 LAB — CBC WITH DIFFERENTIAL/PLATELET
Basophils Absolute: 0 10*3/uL (ref 0.0–0.1)
Basophils Relative: 1.1 % (ref 0.0–3.0)
Eosinophils Absolute: 0.1 10*3/uL (ref 0.0–0.7)
Eosinophils Relative: 2.2 % (ref 0.0–5.0)
HCT: 39.2 % (ref 36.0–46.0)
Hemoglobin: 13.1 g/dL (ref 12.0–15.0)
Lymphocytes Relative: 32.6 % (ref 12.0–46.0)
Lymphs Abs: 1.5 10*3/uL (ref 0.7–4.0)
MCHC: 33.3 g/dL (ref 30.0–36.0)
MCV: 91.6 fl (ref 78.0–100.0)
Monocytes Absolute: 0.6 10*3/uL (ref 0.1–1.0)
Monocytes Relative: 13 % — ABNORMAL HIGH (ref 3.0–12.0)
Neutro Abs: 2.3 10*3/uL (ref 1.4–7.7)
Neutrophils Relative %: 51.1 % (ref 43.0–77.0)
Platelets: 252 10*3/uL (ref 150.0–400.0)
RBC: 4.28 Mil/uL (ref 3.87–5.11)
RDW: 14 % (ref 11.5–15.5)
WBC: 4.5 10*3/uL (ref 4.0–10.5)

## 2021-06-27 LAB — COMPREHENSIVE METABOLIC PANEL
ALT: 17 U/L (ref 0–35)
AST: 18 U/L (ref 0–37)
Albumin: 4.4 g/dL (ref 3.5–5.2)
Alkaline Phosphatase: 55 U/L (ref 39–117)
BUN: 20 mg/dL (ref 6–23)
CO2: 27 mEq/L (ref 19–32)
Calcium: 9.5 mg/dL (ref 8.4–10.5)
Chloride: 105 mEq/L (ref 96–112)
Creatinine, Ser: 0.78 mg/dL (ref 0.40–1.20)
GFR: 82.61 mL/min (ref >60.00)
Glucose, Bld: 85 mg/dL (ref 70–99)
Potassium: 4.1 mEq/L (ref 3.5–5.1)
Sodium: 141 mEq/L (ref 135–145)
Total Bilirubin: 0.6 mg/dL (ref 0.2–1.2)
Total Protein: 6.4 g/dL (ref 6.0–8.3)

## 2021-06-27 LAB — APTT: aPTT: 27.7 s (ref 25.4–36.8)

## 2021-06-27 LAB — PROTIME-INR
INR: 0.9 ratio (ref 0.8–1.0)
Prothrombin Time: 10.2 s (ref 9.6–13.1)

## 2021-06-27 NOTE — Assessment & Plan Note (Addendum)
She exhibits good exercise tolerance. Denies CP, sob. She is  Intermediate surgical risk due to age which she understands.  Pending  Labs.  EKG shows NSR and no significant changes when compared to prior EKG 04/27/20, 02/10/20 and 10/24/2018.   EKG  reviewed with supervising, Dr Deborra Medina, and she and we jointly agreed patient able to proceed with upcoming surgery.

## 2021-06-27 NOTE — Progress Notes (Signed)
Subjective:    Patient ID: Jodi Anderson, female    DOB: Sep 09, 1960, 61 y.o.   MRN: [id number]  CC: [name] is a 61 y.o. female who presents today for follow up.   HPI: She is here today for surgical clearance .   She feels well today. No new complaints.   She is walking and following with physical trainer regularly.  Planned cosmetic surgery with Dr Quincy Sheehan, 08/01/21 in [place], [place].  Planning upper arm and thigh lift.  No sob, cp, palpitations, leg swelling.       Previously seen cardiology, Dr. Sandie Ano 10/24/2018   HISTORY:  No past medical history on file. Past Surgical History:  Procedure Laterality Date   CESAREAN SECTION     3x   tummy tuck     VAGINAL DELIVERY     Family History  Problem Relation Age of Onset   Epilepsy Son    Cancer Paternal Grandmother        breast   Cancer Maternal Aunt        lung    Allergies: Codeine Current Outpatient Medications on File Prior to Visit  Medication Sig Dispense Refill   diclofenac (VOLTAREN) 75 MG EC tablet Take 1 tablet (75 mg total) by mouth 2 (two) times daily. 180 tablet 1   EPINEPHrine (EPIPEN 2-PAK) 0.3 mg/0.3 mL IJ SOAJ injection Inject into thigh once as needed for anaphylaxis/allergic reaction with difficulty breathing. Seek emergency medical care if you have needed to use this 1 each 1   gabapentin (NEURONTIN) 100 MG capsule Take 2 capsules (200 mg total) by mouth 2 (two) times daily. 180 capsule 1   Magnesium 500 MG TABS Take 1 tablet by mouth daily.     triamcinolone cream (KENALOG) 0.1 % Apply to affected areas with eczema BID as needed for up to two weeks at a time. Avoid applying to face, groin, and axilla. Use as directed. Risk of skin atrophy with long-term use reviewed. 45 g 3   zolpidem (AMBIEN) 10 MG tablet Take 1 tablet (10 mg total) by mouth at bedtime as needed. for sleep 30 tablet 2   No current facility-administered medications on file prior to visit.    Social History   Tobacco  Use   Smoking status: Never   Smokeless tobacco: Never  Vaping Use   Vaping Use: Never used  Substance Use Topics   Alcohol use: Yes   Drug use: No    Review of Systems  Constitutional:  Negative for chills and fever.  Respiratory:  Negative for cough.   Cardiovascular:  Negative for chest pain, palpitations and leg swelling.  Gastrointestinal:  Negative for nausea and vomiting.      Objective:    BP 112/68   Pulse 83   Ht 5\' 2"  (1.575 m)   Wt 123 lb 3.2 oz (55.9 kg)   LMP 06/23/2014 (Approximate)   SpO2 98%   BMI 22.53 kg/m  BP Readings from Last 3 Encounters:  06/27/21 112/68  06/14/21 100/72  05/24/21 (!) 107/41   Wt Readings from Last 3 Encounters:  06/27/21 123 lb 3.2 oz (55.9 kg)  06/14/21 124 lb (56.2 kg)  03/06/21 133 lb (60.3 kg)    Physical Exam Vitals reviewed.  Constitutional:      Appearance: She is well-developed.  Eyes:     Conjunctiva/sclera: Conjunctivae normal.  Cardiovascular:     Rate and Rhythm: Normal rate and regular rhythm.     Pulses: Normal pulses.  Heart sounds: Normal heart sounds.  Pulmonary:     Effort: Pulmonary effort is normal.     Breath sounds: Normal breath sounds. No wheezing, rhonchi or rales.  Musculoskeletal:     Right lower leg: No edema.     Left lower leg: No edema.  Skin:    General: Skin is warm and dry.  Neurological:     Mental Status: She is alert.  Psychiatric:        Speech: Speech normal.        Behavior: Behavior normal.        Thought Content: Thought content normal.        Assessment & Plan:   Problem List Items Addressed This Visit       Other   Preop examination    She exhibits good exercise tolerance. Denies CP, sob. She is  Intermediate surgical risk due to age which she understands.  Pending  Labs.  EKG shows NSR and no significant changes when compared to prior EKG 04/27/20, 02/10/20 and 10/24/2018.   EKG  reviewed with supervising, Dr Duncan Dull, and she and we jointly agreed  patient able to proceed with upcoming surgery.        Other Visit Diagnoses     Preoperative clearance    -  Primary   Relevant Orders   EKG 12-Lead (Completed)   Protime-INR (Completed)   APTT (Completed)   CBC with Differential/Platelet (Completed)   Comprehensive metabolic panel (Completed)        I have discontinued [name] R. Foss's calcium-vitamin D, vitamin C, montelukast, fexofenadine, Pfizer-BioNT COVID-19 Vac-TriS, Rhofade, Ivermectin, metroNIDAZOLE, Pfizer COVID-19 Vac Bivalent, Xolair, and diazepam. I am also having her maintain her Magnesium, EPINEPHrine, triamcinolone cream, zolpidem, diclofenac, and gabapentin.   No orders of the defined types were placed in this encounter.   Return precautions given.   Risks, benefits, and alternatives of the medications and treatment plan prescribed today were discussed, and patient expressed understanding.   Education regarding symptom management and diagnosis given to patient on AVS.  Continue to follow with Allegra Grana, FNP for routine health maintenance.   Jodi Anderson and I agreed with plan.   Rennie Plowman, FNP

## 2021-07-14 ENCOUNTER — Telehealth: Payer: Self-pay

## 2021-07-14 NOTE — Telephone Encounter (Signed)
Faxed  notes from last office visit to Kim [place] @ Dr. Bernestine Amass office  @ [phone] on 07/14/21 @ 4:15pm

## 2021-07-18 ENCOUNTER — Telehealth: Payer: Self-pay

## 2021-07-18 ENCOUNTER — Telehealth: Payer: Self-pay | Admitting: Cardiology

## 2021-07-19 ENCOUNTER — Telehealth: Payer: Self-pay | Admitting: Family Medicine

## 2021-07-19 NOTE — Telephone Encounter (Signed)
Pt still struggling with L sided sciatica, l leg, top of foot and big toe. Completed 3 procedures at [facility] but does not feel improvement.  Pt wondering what the next plan would be. OK to respond via MyChart.

## 2021-07-20 NOTE — Telephone Encounter (Signed)
We could increase the gabapentin to see if it helps Or try an piriformis injeciton in the office to see if it helps Or send to surgery to discuss options but not a lot on MRI that I would think is surgical

## 2021-07-20 NOTE — Telephone Encounter (Signed)
Sent patient MyChart message with options.

## 2021-07-31 ENCOUNTER — Other Ambulatory Visit: Payer: Self-pay | Admitting: Dermatology

## 2021-07-31 DIAGNOSIS — L209 Atopic dermatitis, unspecified: Secondary | ICD-10-CM

## 2021-08-17 ENCOUNTER — Telehealth: Payer: Self-pay

## 2021-08-17 NOTE — Telephone Encounter (Signed)
-----   Message from Rockey Situ sent at 08/17/2021 11:06 AM EDT ----- Regarding: self referral Contact: [phone] Dr.Yarbrough saw her mom last year in the hospital as a consult and they really liked him. Patient as been suffering with left side sciatica, had MRI and injections at [facility] imaging. No physical therapy yet. Can she see Dr. Myer Haff or should she have her orthopedic provider Antoine Primas DO with [facility]. refer her to physical therapy?

## 2021-08-18 ENCOUNTER — Other Ambulatory Visit: Payer: Self-pay

## 2021-08-18 DIAGNOSIS — M5416 Radiculopathy, lumbar region: Secondary | ICD-10-CM

## 2021-08-18 NOTE — Telephone Encounter (Signed)
Patient called stating that the surgeon is wanting her to try PT prior to possible surgery.  She asked if Dr Katrinka Blazing would be able to refer to her physical therapy so that she can get this started before her scheduled appointment with them (in 3 weeks)?  Please advise.  She mentioned [facility] on [address] but she was fine with anywhere that Dr Katrinka Blazing would recommend.

## 2021-08-18 NOTE — Telephone Encounter (Signed)
She confirmed appt with Duwayne Heck on 09/14/2021

## 2021-08-22 ENCOUNTER — Telehealth: Payer: Self-pay | Admitting: Family Medicine

## 2021-08-22 NOTE — Telephone Encounter (Signed)
Patient called asking if Dr Katrinka Blazing would be able to prescribe Skelaxin for her? She said that she previously took it with Diclofenac and would like to do that again if possible.  Please advise.

## 2021-08-23 ENCOUNTER — Ambulatory Visit: Payer: Commercial Managed Care - HMO | Admitting: Family Medicine

## 2021-08-23 MED ORDER — METAXALONE 800 MG PO TABS: 800.0000 mg | ORAL_TABLET | Freq: Two times a day (BID) | ORAL | 1 refills | Status: DC | PRN

## 2021-08-29 ENCOUNTER — Ambulatory Visit: Payer: Commercial Managed Care - HMO | Admitting: Family Medicine

## 2021-09-06 ENCOUNTER — Encounter: Payer: Self-pay | Admitting: Physical Therapy

## 2021-09-06 ENCOUNTER — Ambulatory Visit: Payer: Commercial Managed Care - HMO | Attending: Family Medicine | Admitting: Physical Therapy

## 2021-09-06 DIAGNOSIS — M79662 Pain in left lower leg: Secondary | ICD-10-CM | POA: Insufficient documentation

## 2021-09-06 DIAGNOSIS — M5416 Radiculopathy, lumbar region: Secondary | ICD-10-CM | POA: Insufficient documentation

## 2021-09-06 NOTE — Therapy (Signed)
OUTPATIENT PHYSICAL THERAPY THORACOLUMBAR EVALUATION   Patient Name: Jodi Anderson MRN: [id number] DOB:04-19-1960, 61 y.o., female Today's Date: 09/06/2021    No past medical history on file. Past Surgical History:  Procedure Laterality Date   CESAREAN SECTION     3x   tummy tuck     VAGINAL DELIVERY     Patient Active Problem List   Diagnosis Date Noted   Cough 05/22/2021   Lumbar radiculopathy 03/06/2021   Arthritis of midfoot 02/15/2021   Somatic dysfunction of spine, lumbar 01/06/2021   Preop examination 05/02/2020   Hives 02/10/2020   B12 deficiency 09/19/2018   SOB (shortness of breath) on exertion 09/17/2018   Spondylosis without myelopathy or radiculopathy, cervical region 02/12/2018   Other intervertebral disc degeneration, lumbar region 02/12/2018   Tendinopathy of left rotator cuff 10/30/2017   Medication refill 10/30/2017   Subacromial bursitis of left shoulder joint 10/30/2017   Premature beats 04/06/2016   Routine general medical examination at a health care facility 12/10/2013   Palpitations 04/14/2013   Insomnia 04/14/2013    PCP: Rennie Plowman FNP  REFERRING PROVIDER: Antoine Primas DO  REFERRING DIAG:   Rationale for Evaluation and Treatment Rehabilitation  THERAPY DIAG:  No diagnosis found.  ONSET DATE: July 2023  SUBJECTIVE:                                                                                                                                                                                           SUBJECTIVE STATEMENT: Lumbar radiculopathy LLE   PERTINENT HISTORY:  Pt is a 61 year old female presenting with chronic LBP with "sciatica pain down LLE". Has had LBP with sciatica over 6 years, but with worsening pain in the past month, with insidious onset. Has had 3 epidural injections without avail (last was May 2023). Reports LBP is L sided, but only really has this pain if she rides in the ca for a while, or while she is  sleeping. More so than the back pain, the sciatic symptoms (from post/lateral knee across top of her foot to the big toe) are most debilitating. Has this pain all day, feels electrical in nature. Reports nothing makes the pain worse- it is just constant. Can take NSAID or muscle relaxer to temporarily ease pain. Pt is not working, enjoys walking daily 2-66miles 2x/day, and goes to a [employer] account planner 2-3x/day. No falls in past 17mos. Pt denies N/V, B&B changes, unexplained weight fluctuation, saddle paresthesia, fever, night sweats, or unrelenting night pain at this time.   PAIN:  Are you having pain? Yes: NPRS scale: 5/10 Pain location: from post/lateral  knee across top of her foot to the big toe Pain description: electrical, nagging Aggravating factors: nothing makes it worse, just "randomly" gets worse Relieving factors: NSAIDS, muscle relaxer   PRECAUTIONS: None  WEIGHT BEARING RESTRICTIONS No  FALLS:  Has patient fallen in last 6 months? Yes. Number of falls 1  Fell walking in her neighborhood, rolled her R ankle and fell.  LIVING ENVIRONMENT: Lives with: lives with their spouse Lives in: House/apartment Stairs: Yes: Internal: 2 flighter steps; on left going up and External: 4 steps; bilateral but cannot reach both Has following equipment at home: None  OCCUPATION: Not working   PLOF: Independent  PATIENT GOALS Decrease tension at muscles that are pinching sciatic nerve   OBJECTIVE:   DIAGNOSTIC FINDINGS:  MRI Feb 2023 1. Focal degenerative disc disease at L5-S1 with mild bilateral foraminal narrowing. No progression since 2018. 2. Tiny left foraminal protrusion at L4-5 since 2018, without neural contact. 3. Widely patent lumbar spinal canal  PATIENT SURVEYS:  FOTO 52 goal 73  SCREENING FOR RED FLAGS: Bowel or bladder incontinence: No Spinal tumors: No Cauda equina syndrome: No Compression fracture: No Abdominal aneurysm:  No  COGNITION:  Overall cognitive status: Within functional limits for tasks assessed     SENSATION: WFL  MUSCLE LENGTH: Hamstrings: WNL bilat - increased tension/stretch at end range Maisie Fus test: WNL bilat  POSTURE:  lower crossed  PALPATION: Little to no tension at L lumbar parapinals/QL, or glute musculature. Biggest area of TTP with concordant pain sign to ant tib palpation with increase of redicular pain with palpation here  LUMBAR ROM:   Active /PROM A/PROM  eval  Flexion wnl  Extension WNL  Right lateral flexion WNL with stretch sensation  Left lateral flexion WNL  Right rotation wnl  Left rotation wnl   (Blank rows = not tested)  LOWER EXTREMITY ROM:    All  lower extremity P/AROM WNL with PAIN with active L DF (pain free passive DF)  LOWER EXTREMITY MMT:    MMT Right eval Left eval  Hip flexion 4+ 4+  Hip extension 4 4  Hip abduction 4 4  Hip adduction    Hip internal rotation 5 5  Hip external rotation 5 5  Knee flexion 5 5  Knee extension 5 5  Ankle dorsiflexion 5 5*  Ankle plantarflexion    Ankle inversion    Ankle eversion     (Blank rows = not tested) * = pain Pain with resisted DF; most pain with combined DF + eversion resisted   SL heel raise to failure R: 30 and ceased L 14 with difficulty  LUMBAR SPECIAL TESTS:  Straight leg raise test: Negative, Slump test: Negative, Single leg stance test: Negative, FABER test:negative, and Thomas test: Negative Tinel sign at peroneal nerve: POSITIVE  FUNCTIONAL TESTS:  5 times sit to stand: 9sec 10 meter walk test: fastest 1.72m/s; self selected 1.57m/s  GAIT: Distance walked: Assistive device utilized: None Level of assistance: Complete Independence Comments: decreased L heel strike and step length After evaluation pt unable to put weight on LLE for a few steps due to very sensitive pain on the L lower leg    TODAY'S TREATMENT  PT reviewed the following HEP with patient with  patient able to demonstrate a set of the following with min cuing for correction needed. PT educated patient on parameters of therex (how/when to inc/decrease intensity, frequency, rep/set range, stretch hold time, and purpose of therex) with verbalized understanding.  Access Code:  [email or web address] - Standing Anterior Tibialis Stretch  - 3 x daily - 7 x weekly - 30-60sec hold - Single Leg Heel Raise with Counter Support  - 1 x daily - 7 x weekly - 2 sets - 10-12 reps   PATIENT EDUCATION:  Education details: Patient was educated on diagnosis, anatomy and pathology involved, prognosis, role of PT, and was given an HEP, demonstrating exercise with proper form following verbal and tactile cues, and was given a paper hand out to continue exercise at home. Pt was educated on and agreed to plan of care.  Person educated: Patient Education method: Explanation, Demonstration, and Handouts Education comprehension: verbalized understanding, returned demonstration, and verbal cues required   HOME EXERCISE PROGRAM: [email or web address]  ASSESSMENT:  CLINICAL IMPRESSION: Patient is a 62 y.o. female who was seen today for physical therapy evaluation and treatment for redicular pain. Pt with initial thought of lumbar radiculopathy. On exam, all lumbar tests are negative with no tenderness to palpation at glute or lumbar musculature. Patient with positive peroneal tinel sign, dermatome pain sign along superficial peroneal nerve pattern, pain with resisted DF, and most pain with combined resisted DF with eversion, subjective report of more pain with prolonged walking; revealing signs and symptoms of peripheral nerve entrapment of peroneal nerve. Anterior compartment syndrome unlikely due to nature of pain, and lack of swelling/increased pain intensity with prolonged walking. Impairmetns in sensation, decreased PF strength/endurance, pain with resisted DF, decreased SLS of LLE, and abnormal giat. Activity limitations in prolonged  ambulation, transfers, lifting; inhibiting household and community ADLs. Would benefit from skilled PT to address above deficits and promote optimal return to PLOF.    OBJECTIVE IMPAIRMENTS Abnormal gait, decreased activity tolerance, decreased balance, decreased endurance, decreased mobility, difficulty walking, decreased ROM, decreased strength, increased fascial restrictions, increased muscle spasms, impaired flexibility, improper body mechanics, postural dysfunction, and obesity.   ACTIVITY LIMITATIONS lifting, bending, sitting, squatting, and transfers; walking  PARTICIPATION LIMITATIONS: meal prep, cleaning, driving, shopping, community activity, and yard work  PERSONAL FACTORS Age, Past/current experiences, Time since onset of injury/illness/exacerbation, and 1 comorbidity: chronic LBP  are also affecting patient's functional outcome.   REHAB POTENTIAL: Good  CLINICAL DECISION MAKING: Evolving/moderate complexity  EVALUATION COMPLEXITY: Moderate   GOALS: Goals reviewed with patient? Yes  SHORT TERM GOALS: Target date: 10/06/2021  Pt will be independent with HEP in order to improve strength and balance in order to decrease fall risk and improve function at home and work.  Baseline: 09/08/21 HEP given Goal status: INITIAL  LONG TERM GOALS: Target date: 11/03/2021  Patient will increase FOTO score to 73 to demonstrate predicted increase in functional mobility to complete ADLs  Baseline: 09/06/21 52 Goal status: INITIAL  2.  Pt will demonstrate 27 L single leg heel raises to demonstrate 90% strength of L compared to R side to perform heavy ADLs Baseline: L 14  Goal status: INITIAL  3.  Pt will subjectively report nerve symptoms 25% of her day or less in order to complete community and ADLs Baseline: Feels discomfort 100% of the day Goal status: INITIAL    PLAN: PT FREQUENCY: 1-2x/week  PT DURATION: 8 weeks  PLANNED INTERVENTIONS: Therapeutic exercises, Therapeutic  activity, Neuromuscular re-education, Balance training, Gait training, Patient/Family education, Self Care, Joint mobilization, Joint manipulation, Stair training, Dry Needling, Electrical stimulation, Spinal manipulation, Spinal mobilization, Cryotherapy, Moist heat, Traction, Ultrasound, Manual therapy, and Re-evaluation.  PLAN FOR NEXT SESSION: TPDN, nerve flossing   Hilda Lias DPT Hilda Lias, PT 09/06/2021, 9:20 AM

## 2021-09-08 ENCOUNTER — Encounter: Payer: Self-pay | Admitting: Physical Therapy

## 2021-09-11 ENCOUNTER — Ambulatory Visit: Payer: Commercial Managed Care - HMO | Admitting: Physical Therapy

## 2021-09-11 ENCOUNTER — Encounter: Payer: Self-pay | Admitting: Physical Therapy

## 2021-09-11 DIAGNOSIS — M5416 Radiculopathy, lumbar region: Secondary | ICD-10-CM | POA: Diagnosis not present

## 2021-09-11 DIAGNOSIS — M79662 Pain in left lower leg: Secondary | ICD-10-CM

## 2021-09-11 NOTE — Therapy (Signed)
OUTPATIENT PHYSICAL THERAPY THORACOLUMBAR EVALUATION   Patient Name: Jodi Anderson MRN: [id number] DOB:24-Jun-1960, 61 y.o., female Today's Date: 09/11/2021    No past medical history on file. Past Surgical History:  Procedure Laterality Date   CESAREAN SECTION     3x   tummy tuck     VAGINAL DELIVERY     Patient Active Problem List   Diagnosis Date Noted   Cough 05/22/2021   Lumbar radiculopathy 03/06/2021   Arthritis of midfoot 02/15/2021   Somatic dysfunction of spine, lumbar 01/06/2021   Preop examination 05/02/2020   Hives 02/10/2020   B12 deficiency 09/19/2018   SOB (shortness of breath) on exertion 09/17/2018   Spondylosis without myelopathy or radiculopathy, cervical region 02/12/2018   Other intervertebral disc degeneration, lumbar region 02/12/2018   Tendinopathy of left rotator cuff 10/30/2017   Medication refill 10/30/2017   Subacromial bursitis of left shoulder joint 10/30/2017   Premature beats 04/06/2016   Routine general medical examination at a health care facility 12/10/2013   Palpitations 04/14/2013   Insomnia 04/14/2013    PCP: Rennie Plowman FNP  REFERRING PROVIDER: Antoine Primas DO  REFERRING DIAG:   Rationale for Evaluation and Treatment Rehabilitation  THERAPY DIAG:  No diagnosis found.  ONSET DATE: July 2023  SUBJECTIVE:                                                                                                                                                                                           SUBJECTIVE STATEMENT: Pt reports she is still feeling tingling from lateral lower leg to across the top of the toes, tingling in the big toe currently. Completing HEP.   PERTINENT HISTORY:  Pt is a 61 year old female presenting with chronic LBP with "sciatica pain down LLE". Has had LBP with sciatica over 6 years, but with worsening pain in the past month, with insidious onset. Has had 3 epidural injections without avail (last was  May 2023). Reports LBP is L sided, but only really has this pain if she rides in the ca for a while, or while she is sleeping. More so than the back pain, the sciatic symptoms (from post/lateral knee across top of her foot to the big toe) are most debilitating. Has this pain all day, feels electrical in nature. Reports nothing makes the pain worse- it is just constant. Can take NSAID or muscle relaxer to temporarily ease pain. 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 No falls in past 58mos. Pt denies N/V, B&B changes, unexplained weight fluctuation, saddle paresthesia, fever, night sweats,  or unrelenting night pain at this time.   PAIN:  Are you having pain? Yes: NPRS scale: 5/10 Pain location: from post/lateral knee across top of her foot to the big toe Pain description: electrical, nagging Aggravating factors: nothing makes it worse, just "randomly" gets worse Relieving factors: NSAIDS, muscle relaxer   PRECAUTIONS: None  WEIGHT BEARING RESTRICTIONS No  FALLS:  Has patient fallen in last 6 months? Yes. Number of falls 1  Fell walking in her neighborhood, rolled her R ankle and fell.  LIVING ENVIRONMENT: Lives with: lives with their spouse Lives in: House/apartment Stairs: Yes: Internal: 2 flighter steps; on left going up and External: 4 steps; bilateral but cannot reach both Has following equipment at home: None  OCCUPATION: Not working   PLOF: Independent  PATIENT GOALS Decrease tension at muscles that are pinching sciatic nerve   OBJECTIVE:   DIAGNOSTIC FINDINGS:  MRI Feb 2023 1. Focal degenerative disc disease at L5-S1 with mild bilateral foraminal narrowing. No progression since 2018. 2. Tiny left foraminal protrusion at L4-5 since 2018, without neural contact. 3. Widely patent lumbar spinal canal  PATIENT SURVEYS:  FOTO 52 goal 73  SCREENING FOR RED FLAGS: Bowel or bladder incontinence:  No Spinal tumors: No Cauda equina syndrome: No Compression fracture: No Abdominal aneurysm: No  COGNITION:  Overall cognitive status: Within functional limits for tasks assessed     SENSATION: WFL  MUSCLE LENGTH: Hamstrings: WNL bilat - increased tension/stretch at end range Maisie Fus test: WNL bilat  POSTURE:  lower crossed  PALPATION: Little to no tension at L lumbar parapinals/QL, or glute musculature. Biggest area of TTP with concordant pain sign to ant tib palpation with increase of redicular pain with palpation here  LUMBAR ROM:   Active /PROM A/PROM  eval  Flexion wnl  Extension WNL  Right lateral flexion WNL with stretch sensation  Left lateral flexion WNL  Right rotation wnl  Left rotation wnl   (Blank rows = not tested)  LOWER EXTREMITY ROM:    All  lower extremity P/AROM WNL with PAIN with active L DF (pain free passive DF)  LOWER EXTREMITY MMT:    MMT Right eval Left eval  Hip flexion 4+ 4+  Hip extension 4 4  Hip abduction 4 4  Hip adduction    Hip internal rotation 5 5  Hip external rotation 5 5  Knee flexion 5 5  Knee extension 5 5  Ankle dorsiflexion 5 5*  Ankle plantarflexion    Ankle inversion    Ankle eversion     (Blank rows = not tested) * = pain Pain with resisted DF; most pain with combined DF + eversion resisted   SL heel raise to failure R: 30 and ceased L 14 with difficulty  LUMBAR SPECIAL TESTS:  Straight leg raise test: Negative, Slump test: Negative, Single leg stance test: Negative, FABER test:negative, and Thomas test: Negative Tinel sign at peroneal nerve: POSITIVE  FUNCTIONAL TESTS:  5 times sit to stand: 9sec 10 meter walk test: fastest 1.3m/s; self selected 1.12m/s  GAIT: Distance walked: Assistive device utilized: None Level of assistance: Complete Independence Comments: decreased L heel strike and step length After evaluation pt unable to put weight on LLE for a few steps due to very sensitive pain on  the L lower leg    TODAY'S TREATMENT  Manual STM with trigger point release to ant tib and peroneal group muscle bellies Following: Dry Needling: (2/2) 14mm .25 needles placed along the L ant tib  and peroneal brevis/longus muscle belly to decrease increased muscular spasms and trigger points with the patient positioned in supine. Patient was educated on risks and benefits of therapy and verbally consents to PT.    Ther-Ex Hooklying sciatic nerve glide with PF <> DF in 90/90 posiiton x20 SL heel raise from floor x10 with increased pain following SL heel raise with eccentric lower focus total gym L26 2x 10  Nustep seat 6 LE only L3 with foot straps tight, focus on DF <> PF   PATIENT EDUCATION:  Education details: Patient was educated on diagnosis, anatomy and pathology involved, prognosis, role of PT, and was given an HEP, demonstrating exercise with proper form following verbal and tactile cues, and was given a paper hand out to continue exercise at home. Pt was educated on and agreed to plan of care.  Person educated: Patient Education method: Explanation, Demonstration, and Handouts Education comprehension: verbalized understanding, returned demonstration, and verbal cues required   HOME EXERCISE PROGRAM: [id number]  ASSESSMENT:  CLINICAL IMPRESSION: PT initiated manual techniques to relieve muscle tension of ant tib and peroneal group, impinging peroneal nerve roots with success. Following manual techniques and nerve glide patient reports resolution of tingling symptoms, allowing for PF reciprocal inhibition activation. Patient with demonstrated weakness of gastroc group, able to comply with cuing for strengthening and endurance exercises for this without increased pain/symptoms. PT will continue progression as able.     OBJECTIVE IMPAIRMENTS Abnormal gait, decreased activity tolerance, decreased balance, decreased endurance, decreased mobility, difficulty walking, decreased  ROM, decreased strength, increased fascial restrictions, increased muscle spasms, impaired flexibility, improper body mechanics, postural dysfunction, and obesity.   ACTIVITY LIMITATIONS lifting, bending, sitting, squatting, and transfers; walking  PARTICIPATION LIMITATIONS: meal prep, cleaning, driving, shopping, community activity, and yard work  PERSONAL FACTORS Age, Past/current experiences, Time since onset of injury/illness/exacerbation, and 1 comorbidity: chronic LBP  are also affecting patient's functional outcome.   REHAB POTENTIAL: Good  CLINICAL DECISION MAKING: Evolving/moderate complexity  EVALUATION COMPLEXITY: Moderate   GOALS: Goals reviewed with patient? Yes  SHORT TERM GOALS: Target date: 10/09/2021  Pt will be independent with HEP in order to improve strength and balance in order to decrease fall risk and improve function at home and work.  Baseline: 09/08/21 HEP given Goal status: INITIAL  LONG TERM GOALS: Target date: 11/06/2021  Patient will increase FOTO score to 73 to demonstrate predicted increase in functional mobility to complete ADLs  Baseline: 09/06/21 52 Goal status: INITIAL  2.  Pt will demonstrate 27 L single leg heel raises to demonstrate 90% strength of L compared to R side to perform heavy ADLs Baseline: L 14  Goal status: INITIAL  3.  Pt will subjectively report nerve symptoms 25% of her day or less in order to complete community and ADLs Baseline: Feels discomfort 100% of the day Goal status: INITIAL    PLAN: PT FREQUENCY: 1-2x/week  PT DURATION: 8 weeks  PLANNED INTERVENTIONS: Therapeutic exercises, Therapeutic activity, Neuromuscular re-education, Balance training, Gait training, Patient/Family education, Self Care, Joint mobilization, Joint manipulation, Stair training, Dry Needling, Electrical stimulation, Spinal manipulation, Spinal mobilization, Cryotherapy, Moist heat, Traction, Ultrasound, Manual therapy, and  Re-evaluation.  PLAN FOR NEXT SESSION: TPDN, nerve flossing   Hilda Lias DPT Hilda Lias, PT 09/11/2021, 9:12 AM

## 2021-09-12 ENCOUNTER — Ambulatory Visit: Payer: Commercial Managed Care - HMO | Admitting: Physical Therapy

## 2021-09-12 ENCOUNTER — Encounter: Payer: Self-pay | Admitting: Physical Therapy

## 2021-09-12 DIAGNOSIS — M79662 Pain in left lower leg: Secondary | ICD-10-CM | POA: Diagnosis not present

## 2021-09-12 NOTE — Therapy (Signed)
OUTPATIENT PHYSICAL THERAPY THORACOLUMBAR EVALUATION   Patient Name: Jodi Anderson MRN: [id number] DOB:[birth year], 61 y.o., female Today's Date: 09/12/2021   PT End of Session - 09/12/21 1416     Visit Number 3    Number of Visits 17    Date for PT Re-Evaluation 11/10/21    Authorization - Visit Number 3    Authorization - Number of Visits 10    PT Start Time 1345    PT Stop Time 1423    PT Time Calculation (min) 38 min    Activity Tolerance Patient tolerated treatment well    Behavior During Therapy [facility] for tasks assessed/performed             History reviewed. No pertinent past medical history. Past Surgical History:  Procedure Laterality Date   CESAREAN SECTION     3x   tummy tuck     VAGINAL DELIVERY     Patient Active Problem List   Diagnosis Date Noted   Cough 05/22/2021   Lumbar radiculopathy 03/06/2021   Arthritis of midfoot 02/15/2021   Somatic dysfunction of spine, lumbar 01/06/2021   Preop examination 05/02/2020   Hives 02/10/2020   B12 deficiency 09/19/2018   SOB (shortness of breath) on exertion 09/17/2018   Spondylosis without myelopathy or radiculopathy, cervical region 02/12/2018   Other intervertebral disc degeneration, lumbar region 02/12/2018   Tendinopathy of left rotator cuff 10/30/2017   Medication refill 10/30/2017   Subacromial bursitis of left shoulder joint 10/30/2017   Premature beats 04/06/2016   Routine general medical examination at a health care facility 12/10/2013   Palpitations 04/14/2013   Insomnia 04/14/2013    PCP: Rennie Plowman FNP  REFERRING PROVIDER: Antoine Primas DO  REFERRING DIAG:   Rationale for Evaluation and Treatment Rehabilitation  THERAPY DIAG:  Pain in left lower leg  ONSET DATE: July 2023  SUBJECTIVE:                                                                                                                                                                                            SUBJECTIVE STATEMENT: Pt reports she is feeling good today. She walked and did her workout and had no increased pain today which she is happy with. Is completing HEP without question or concern  PERTINENT HISTORY:  Pt is a 61 year old female presenting with chronic LBP with "sciatica pain down LLE". Has had LBP with sciatica over 6 years, but with worsening pain in the past month, with insidious onset. Has had 3 epidural injections without avail (last was May 2023). Reports LBP is L sided, but only really  has this pain if she rides in the ca for a while, or while she is sleeping. More so than the back pain, the sciatic symptoms (from post/lateral knee across top of her foot to the big toe) are most debilitating. Has this pain all day, feels electrical in nature. Reports nothing makes the pain worse- it is just constant. Can take NSAID or muscle relaxer to temporarily ease pain. Pt is not working, enjoys walking daily 2-21miles 2x/day, and goes to a [employer] account planner 2-3x/day. No falls in past 89mos. Pt denies N/V, B&B changes, unexplained weight fluctuation, saddle paresthesia, fever, night sweats, or unrelenting night pain at this time.   PAIN:  Are you having pain? Yes: NPRS scale: 5/10 Pain location: from post/lateral knee across top of her foot to the big toe Pain description: electrical, nagging Aggravating factors: nothing makes it worse, just "randomly" gets worse Relieving factors: NSAIDS, muscle relaxer   PRECAUTIONS: None  WEIGHT BEARING RESTRICTIONS No  FALLS:  Has patient fallen in last 6 months? Yes. Number of falls 1  Fell walking in her neighborhood, rolled her R ankle and fell.  LIVING ENVIRONMENT: Lives with: lives with their spouse Lives in: House/apartment Stairs: Yes: Internal: 2 flighter steps; on left going up and External: 4 steps; bilateral but cannot reach both Has following equipment at home: None  OCCUPATION: Not working   PLOF:  Independent  PATIENT GOALS Decrease tension at muscles that are pinching sciatic nerve   OBJECTIVE:   DIAGNOSTIC FINDINGS:  MRI Feb 2023 1. Focal degenerative disc disease at L5-S1 with mild bilateral foraminal narrowing. No progression since 2018. 2. Tiny left foraminal protrusion at L4-5 since 2018, without neural contact. 3. Widely patent lumbar spinal canal  PATIENT SURVEYS:  FOTO 52 goal 73  SCREENING FOR RED FLAGS: Bowel or bladder incontinence: No Spinal tumors: No Cauda equina syndrome: No Compression fracture: No Abdominal aneurysm: No  COGNITION:  Overall cognitive status: Within functional limits for tasks assessed     SENSATION: WFL  MUSCLE LENGTH: Hamstrings: WNL bilat - increased tension/stretch at end range Maisie Fus test: WNL bilat  POSTURE:  lower crossed  PALPATION: Little to no tension at L lumbar parapinals/QL, or glute musculature. Biggest area of TTP with concordant pain sign to ant tib palpation with increase of redicular pain with palpation here  LUMBAR ROM:   Active /PROM A/PROM  eval  Flexion wnl  Extension WNL  Right lateral flexion WNL with stretch sensation  Left lateral flexion WNL  Right rotation wnl  Left rotation wnl   (Blank rows = not tested)  LOWER EXTREMITY ROM:    All  lower extremity P/AROM WNL with PAIN with active L DF (pain free passive DF)  LOWER EXTREMITY MMT:    MMT Right eval Left eval  Hip flexion 4+ 4+  Hip extension 4 4  Hip abduction 4 4  Hip adduction    Hip internal rotation 5 5  Hip external rotation 5 5  Knee flexion 5 5  Knee extension 5 5  Ankle dorsiflexion 5 5*  Ankle plantarflexion    Ankle inversion    Ankle eversion     (Blank rows = not tested) * = pain Pain with resisted DF; most pain with combined DF + eversion resisted   SL heel raise to failure R: 30 and ceased L 14 with difficulty  LUMBAR SPECIAL TESTS:  Straight leg raise test: Negative, Slump test: Negative, Single leg  stance test: Negative, FABER test:negative,  and Thomas test: Negative Tinel sign at peroneal nerve: POSITIVE  FUNCTIONAL TESTS:  5 times sit to stand: 9sec 10 meter walk test: fastest 1.66m/s; self selected 1.52m/s  GAIT: Distance walked: Assistive device utilized: None Level of assistance: Complete Independence Comments: decreased L heel strike and step length After evaluation pt unable to put weight on LLE for a few steps due to very sensitive pain on the L lower leg    TODAY'S TREATMENT  Manual STM with trigger point release to ant tib and peroneal group muscle bellies Following: Dry Needling: (2/2) 64mm .25 needles placed along the L ant tib and peroneal brevis/longus muscle belly to decrease increased muscular spasms and trigger points with the patient positioned in supine. Patient was educated on risks and benefits of therapy and verbally consents to PT.    Ther-Ex Hooklying sciatic nerve glide with PF <> DF in 90/90 posiiton x20 Heel raise with eccentric lower from step 2x 12 with cuing for eccentric control  Heel raise in wall sit 2x 10 with good carry over of initial demo Nustep seat 6 LE only L3 ; L4 with foot straps tight, focus on DF <> PF  Ant tib stretch 30sec hold   PATIENT EDUCATION:  Education details: Patient was educated on diagnosis, anatomy and pathology involved, prognosis, role of PT, and was given an HEP, demonstrating exercise with proper form following verbal and tactile cues, and was given a paper hand out to continue exercise at home. Pt was educated on and agreed to plan of care.  Person educated: Patient Education method: Explanation, Demonstration, and Handouts Education comprehension: verbalized understanding, returned demonstration, and verbal cues required   HOME EXERCISE PROGRAM: [id number]  ASSESSMENT:  CLINICAL IMPRESSION: PT continued use of manual and TPDN for ant release and PF reciprocal inhibition activation with  success. Patient with demonstrated weakness of gastroc group, able to comply with cuing for strengthening and endurance exercises for this without increased pain/symptoms. Pt reports no increase of symptoms throughout session. PT will continue progression as able.     OBJECTIVE IMPAIRMENTS Abnormal gait, decreased activity tolerance, decreased balance, decreased endurance, decreased mobility, difficulty walking, decreased ROM, decreased strength, increased fascial restrictions, increased muscle spasms, impaired flexibility, improper body mechanics, postural dysfunction, and obesity.   ACTIVITY LIMITATIONS lifting, bending, sitting, squatting, and transfers; walking  PARTICIPATION LIMITATIONS: meal prep, cleaning, driving, shopping, community activity, and yard work  PERSONAL FACTORS Age, Past/current experiences, Time since onset of injury/illness/exacerbation, and 1 comorbidity: chronic LBP  are also affecting patient's functional outcome.   REHAB POTENTIAL: Good  CLINICAL DECISION MAKING: Evolving/moderate complexity  EVALUATION COMPLEXITY: Moderate   GOALS: Goals reviewed with patient? Yes  SHORT TERM GOALS: Target date: 10/10/2021  Pt will be independent with HEP in order to improve strength and balance in order to decrease fall risk and improve function at home and work.  Baseline: 09/08/21 HEP given Goal status: INITIAL  LONG TERM GOALS: Target date: 11/07/2021  Patient will increase FOTO score to 73 to demonstrate predicted increase in functional mobility to complete ADLs  Baseline: 09/06/21 52 Goal status: INITIAL  2.  Pt will demonstrate 27 L single leg heel raises to demonstrate 90% strength of L compared to R side to perform heavy ADLs Baseline: L 14  Goal status: INITIAL  3.  Pt will subjectively report nerve symptoms 25% of her day or less in order to complete community and ADLs Baseline: Feels discomfort 100% of the day Goal status: INITIAL  PLAN: PT  FREQUENCY: 1-2x/week  PT DURATION: 8 weeks  PLANNED INTERVENTIONS: Therapeutic exercises, Therapeutic activity, Neuromuscular re-education, Balance training, Gait training, Patient/Family education, Self Care, Joint mobilization, Joint manipulation, Stair training, Dry Needling, Electrical stimulation, Spinal manipulation, Spinal mobilization, Cryotherapy, Moist heat, Traction, Ultrasound, Manual therapy, and Re-evaluation.  PLAN FOR NEXT SESSION: TPDN, nerve flossing   Hilda Lias DPT Hilda Lias, PT 09/12/2021, 2:24 PM

## 2021-09-13 ENCOUNTER — Encounter: Payer: Commercial Managed Care - HMO | Admitting: Physical Therapy

## 2021-09-13 ENCOUNTER — Ambulatory Visit: Payer: Commercial Managed Care - HMO | Admitting: Physical Therapy

## 2021-09-14 ENCOUNTER — Ambulatory Visit: Payer: Self-pay | Admitting: Neurosurgery

## 2021-09-18 ENCOUNTER — Ambulatory Visit: Payer: Commercial Managed Care - HMO | Admitting: Physical Therapy

## 2021-09-20 ENCOUNTER — Encounter: Payer: Commercial Managed Care - HMO | Admitting: Physical Therapy

## 2021-09-21 ENCOUNTER — Telehealth: Payer: Self-pay | Admitting: Family Medicine

## 2021-09-21 ENCOUNTER — Ambulatory Visit: Payer: Commercial Managed Care - HMO | Admitting: Physical Therapy

## 2021-09-21 ENCOUNTER — Encounter: Payer: Self-pay | Admitting: Physical Therapy

## 2021-09-21 DIAGNOSIS — M79662 Pain in left lower leg: Secondary | ICD-10-CM

## 2021-09-21 NOTE — Telephone Encounter (Signed)
Patient called to give Dr Katrinka Blazing an update..  She said that she has been doing PT but the therapist thinks at this point that her pain is most likely coming from the nerve above her knee. She has had multiple tests on her back but they have all been negative. So the therapist mentioned that she might be a good candidate for a nerve ablation from the knee down?  Any thoughts?

## 2021-09-21 NOTE — Therapy (Signed)
OUTPATIENT PHYSICAL THERAPY THORACOLUMBAR EVALUATION   Patient Name: Jodi Anderson MRN: [id number] DOB:[birth year], 61 y.o., female Today's Date: 09/21/2021   PT End of Session - 09/21/21 1330     Visit Number 4    Number of Visits 17    Date for PT Re-Evaluation 11/10/21    Authorization - Visit Number 4    Authorization - Number of Visits 10    PT Start Time [id number]    PT Stop Time 1345    PT Time Calculation (min) 43 min    Activity Tolerance Patient tolerated treatment well    Behavior During Therapy [facility] for tasks assessed/performed              History reviewed. No pertinent past medical history. Past Surgical History:  Procedure Laterality Date   CESAREAN SECTION     3x   tummy tuck     VAGINAL DELIVERY     Patient Active Problem List   Diagnosis Date Noted   Cough 05/22/2021   Lumbar radiculopathy 03/06/2021   Arthritis of midfoot 02/15/2021   Somatic dysfunction of spine, lumbar 01/06/2021   Preop examination 05/02/2020   Hives 02/10/2020   B12 deficiency 09/19/2018   SOB (shortness of breath) on exertion 09/17/2018   Spondylosis without myelopathy or radiculopathy, cervical region 02/12/2018   Other intervertebral disc degeneration, lumbar region 02/12/2018   Tendinopathy of left rotator cuff 10/30/2017   Medication refill 10/30/2017   Subacromial bursitis of left shoulder joint 10/30/2017   Premature beats 04/06/2016   Routine general medical examination at a health care facility 12/10/2013   Palpitations 04/14/2013   Insomnia 04/14/2013    PCP: Mable Paris FNP  REFERRING PROVIDER: Hulan Saas DO  REFERRING DIAG:   Rationale for Evaluation and Treatment Rehabilitation  THERAPY DIAG:  Pain in left lower leg  ONSET DATE: July 2023  SUBJECTIVE:                                                                                                                                                                                            SUBJECTIVE STATEMENT: Pt reports she has complied with HEP 50% with traveling scheduled. Reports pain is 7/10 today. Has some n/t to big toe.  PERTINENT HISTORY:  Pt is a 61 year old female presenting with chronic LBP with "sciatica pain down LLE". Has had LBP with sciatica over 6 years, but with worsening pain in the past month, with insidious onset. Has had 3 epidural injections without avail (last was May 2023). Reports LBP is L sided, but only really has this pain if she rides in the ca  for a while, or while she is sleeping. More so than the back pain, the sciatic symptoms (from post/lateral knee across top of her foot to the big toe) are most debilitating. Has this pain all day, feels electrical in nature. Reports nothing makes the pain worse- it is just constant. Can take NSAID or muscle relaxer to temporarily ease pain. Pt is not working, enjoys walking daily 2-34miles 2x/day, and goes to a [employer] account planner 2-3x/day. No falls in past 79mos. Pt denies N/V, B&B changes, unexplained weight fluctuation, saddle paresthesia, fever, night sweats, or unrelenting night pain at this time.   PAIN:  Are you having pain? Yes: NPRS scale: 5/10 Pain location: from post/lateral knee across top of her foot to the big toe Pain description: electrical, nagging Aggravating factors: nothing makes it worse, just "randomly" gets worse Relieving factors: NSAIDS, muscle relaxer   PRECAUTIONS: None  WEIGHT BEARING RESTRICTIONS No  FALLS:  Has patient fallen in last 6 months? Yes. Number of falls 1  Fell walking in her neighborhood, rolled her R ankle and fell.  LIVING ENVIRONMENT: Lives with: lives with their spouse Lives in: House/apartment Stairs: Yes: Internal: 2 flighter steps; on left going up and External: 4 steps; bilateral but cannot reach both Has following equipment at home: None  OCCUPATION: Not working   PLOF: Independent  PATIENT GOALS Decrease tension at muscles  that are pinching sciatic nerve   OBJECTIVE:   DIAGNOSTIC FINDINGS:  MRI Feb 2023 1. Focal degenerative disc disease at L5-S1 with mild bilateral foraminal narrowing. No progression since 2018. 2. Tiny left foraminal protrusion at L4-5 since 2018, without neural contact. 3. Widely patent lumbar spinal canal  PATIENT SURVEYS:  FOTO 52 goal 73  SCREENING FOR RED FLAGS: Bowel or bladder incontinence: No Spinal tumors: No Cauda equina syndrome: No Compression fracture: No Abdominal aneurysm: No  COGNITION:  Overall cognitive status: Within functional limits for tasks assessed     SENSATION: WFL  MUSCLE LENGTH: Hamstrings: WNL bilat - increased tension/stretch at end range Maisie Fus test: WNL bilat  POSTURE:  lower crossed  PALPATION: Little to no tension at L lumbar parapinals/QL, or glute musculature. Biggest area of TTP with concordant pain sign to ant tib palpation with increase of redicular pain with palpation here  LUMBAR ROM:   Active /PROM A/PROM  eval  Flexion wnl  Extension WNL  Right lateral flexion WNL with stretch sensation  Left lateral flexion WNL  Right rotation wnl  Left rotation wnl   (Blank rows = not tested)  LOWER EXTREMITY ROM:    All  lower extremity P/AROM WNL with PAIN with active L DF (pain free passive DF)  LOWER EXTREMITY MMT:    MMT Right eval Left eval  Hip flexion 4+ 4+  Hip extension 4 4  Hip abduction 4 4  Hip adduction    Hip internal rotation 5 5  Hip external rotation 5 5  Knee flexion 5 5  Knee extension 5 5  Ankle dorsiflexion 5 5*  Ankle plantarflexion    Ankle inversion    Ankle eversion     (Blank rows = not tested) * = pain Pain with resisted DF; most pain with combined DF + eversion resisted   SL heel raise to failure R: 30 and ceased L 14 with difficulty  LUMBAR SPECIAL TESTS:  Straight leg raise test: Negative, Slump test: Negative, Single leg stance test: Negative, FABER test:negative, and Thomas test:  Negative Tinel sign at peroneal nerve:  POSITIVE  FUNCTIONAL TESTS:  5 times sit to stand: 9sec 10 meter walk test: fastest 1.71m/s; self selected 1.8m/s  GAIT: Distance walked: 80meter Assistive device utilized: None Level of assistance: Complete Independence Comments: decreased L heel strike and step length After evaluation pt unable to put weight on LLE for a few steps due to very sensitive pain on the L lower leg    TODAY'S TREATMENT  Manual STM with trigger point release to ant tib and peroneal group muscle bellies Following: Dry Needling: (2/2) 52mm .25 needles placed along the L ant tib and peroneal brevis/longus muscle belly to decrease increased muscular spasms and trigger points with the patient positioned in supine. Patient was educated on risks and benefits of therapy and verbally consents to PT.  Manual ant tib stretch 5sec x6 Manual peroneal brevis/longus stretch 5sec x6   Ther-Ex Combined PF + inv against resistance GTB with eccentric moment back to DF + eversion 2x 12 with good carry ove rof cuing; TC to prevent tibial torsion Toe raise with eccentric lower from step x12 ; with GTB resistance x12 with cuing for eccentric control  Heel raise with eccentric lower from step x12 Nustep seat 6 LE only L4 69mins with foot straps tight, focus on DF <> PF  Ant tib stretch 30sec hold   PATIENT EDUCATION:  Education details: Patient was educated on diagnosis, anatomy and pathology involved, prognosis, role of PT, and was given an HEP, demonstrating exercise with proper form following verbal and tactile cues, and was given a paper hand out to continue exercise at home. Pt was educated on and agreed to plan of care.  Person educated: Patient Education method: Explanation, Demonstration, and Handouts Education comprehension: verbalized understanding, returned demonstration, and verbal cues required   HOME EXERCISE PROGRAM: [id number]  ASSESSMENT:  CLINICAL IMPRESSION: PT  continued use of manual and TPDN for ant release and ant tib and peroneal eccentric strengthening focus. Patient  able to comply with cuing for strengthening and endurance exercises for this without increased pain/symptoms. Pt reports no n/t after manual with TPDN techniques (7/10 pain with this prior). Pt reports no increase of symptoms throughout session. PT will continue progression as able.     OBJECTIVE IMPAIRMENTS Abnormal gait, decreased activity tolerance, decreased balance, decreased endurance, decreased mobility, difficulty walking, decreased ROM, decreased strength, increased fascial restrictions, increased muscle spasms, impaired flexibility, improper body mechanics, postural dysfunction, and obesity.   ACTIVITY LIMITATIONS lifting, bending, sitting, squatting, and transfers; walking  PARTICIPATION LIMITATIONS: meal prep, cleaning, driving, shopping, community activity, and yard work  PERSONAL FACTORS Age, Past/current experiences, Time since onset of injury/illness/exacerbation, and 1 comorbidity: chronic LBP  are also affecting patient's functional outcome.   REHAB POTENTIAL: Good  CLINICAL DECISION MAKING: Evolving/moderate complexity  EVALUATION COMPLEXITY: Moderate   GOALS: Goals reviewed with patient? Yes  SHORT TERM GOALS: Target date: 10/19/2021  Pt will be independent with HEP in order to improve strength and balance in order to decrease fall risk and improve function at home and work.  Baseline: 09/08/21 HEP given Goal status: INITIAL  LONG TERM GOALS: Target date: 11/16/2021  Patient will increase FOTO score to 73 to demonstrate predicted increase in functional mobility to complete ADLs  Baseline: 09/06/21 52 Goal status: INITIAL  2.  Pt will demonstrate 27 L single leg heel raises to demonstrate 90% strength of L compared to R side to perform heavy ADLs Baseline: L 14  Goal status: INITIAL  3.  Pt will subjectively report nerve symptoms  25% of her day or  less in order to complete community and ADLs Baseline: Feels discomfort 100% of the day Goal status: INITIAL    PLAN: PT FREQUENCY: 1-2x/week  PT DURATION: 8 weeks  PLANNED INTERVENTIONS: Therapeutic exercises, Therapeutic activity, Neuromuscular re-education, Balance training, Gait training, Patient/Family education, Self Care, Joint mobilization, Joint manipulation, Stair training, Dry Needling, Electrical stimulation, Spinal manipulation, Spinal mobilization, Cryotherapy, Moist heat, Traction, Ultrasound, Manual therapy, and Re-evaluation.  PLAN FOR NEXT SESSION: TPDN, nerve flossing   Hilda Lias DPT Hilda Lias, PT 09/21/2021, 1:49 PM

## 2021-09-22 ENCOUNTER — Ambulatory Visit: Payer: Commercial Managed Care - HMO | Admitting: Physical Therapy

## 2021-09-26 NOTE — Telephone Encounter (Signed)
We can see if Dr. Jordan Likes is doing this

## 2021-09-27 ENCOUNTER — Ambulatory Visit: Payer: Commercial Managed Care - HMO | Attending: Family Medicine | Admitting: Physical Therapy

## 2021-09-27 ENCOUNTER — Encounter: Payer: Commercial Managed Care - HMO | Admitting: Physical Therapy

## 2021-09-27 ENCOUNTER — Encounter: Payer: Self-pay | Admitting: Physical Therapy

## 2021-09-27 DIAGNOSIS — M79662 Pain in left lower leg: Secondary | ICD-10-CM | POA: Insufficient documentation

## 2021-09-27 NOTE — Therapy (Signed)
OUTPATIENT PHYSICAL THERAPY THORACOLUMBAR EVALUATION   Patient Name: Jodi Anderson MRN: [id number] DOB:[birth year], 61 y.o., female Today's Date: 09/27/2021   PT End of Session - 09/27/21 0934     Visit Number 5    Number of Visits 17    Date for PT Re-Evaluation 11/10/21    Authorization - Visit Number 5    Authorization - Number of Visits 10    PT Start Time 0919    PT Stop Time 0957    PT Time Calculation (min) 38 min    Activity Tolerance Patient tolerated treatment well    Behavior During Therapy [facility] for tasks assessed/performed               History reviewed. No pertinent past medical history. Past Surgical History:  Procedure Laterality Date   CESAREAN SECTION     3x   tummy tuck     VAGINAL DELIVERY     Patient Active Problem List   Diagnosis Date Noted   Cough 05/22/2021   Lumbar radiculopathy 03/06/2021   Arthritis of midfoot 02/15/2021   Somatic dysfunction of spine, lumbar 01/06/2021   Preop examination 05/02/2020   Hives 02/10/2020   B12 deficiency 09/19/2018   SOB (shortness of breath) on exertion 09/17/2018   Spondylosis without myelopathy or radiculopathy, cervical region 02/12/2018   Other intervertebral disc degeneration, lumbar region 02/12/2018   Tendinopathy of left rotator cuff 10/30/2017   Medication refill 10/30/2017   Subacromial bursitis of left shoulder joint 10/30/2017   Premature beats 04/06/2016   Routine general medical examination at a health care facility 12/10/2013   Palpitations 04/14/2013   Insomnia 04/14/2013    PCP: Mable Paris FNP  REFERRING PROVIDER: Hulan Saas DO  REFERRING DIAG:   Rationale for Evaluation and Treatment Rehabilitation  THERAPY DIAG:  Pain in left lower leg  ONSET DATE: July 2023  SUBJECTIVE:                                                                                                                                                                                            SUBJECTIVE STATEMENT: Pt reports she has been doing well today and yesterday. Reports she had some increased flare up this weekend, but took muscle relaxers which helped. Reports barely across the top of her big toe currently. Reports this is 3/10. Pt was sore following last session.   PERTINENT HISTORY:  Pt is a 61 year old female presenting with chronic LBP with "sciatica pain down LLE". Has had LBP with sciatica over 6 years, but with worsening pain in the past month, with insidious onset. Has had 3  epidural injections without avail (last was May 2023). Reports LBP is L sided, but only really has this pain if she rides in the ca for a while, or while she is sleeping. More so than the back pain, the sciatic symptoms (from post/lateral knee across top of her foot to the big toe) are most debilitating. Has this pain all day, feels electrical in nature. Reports nothing makes the pain worse- it is just constant. Can take NSAID or muscle relaxer to temporarily ease pain. Pt is not working, enjoys walking daily 2-30miles 2x/day, and goes to a [employer] account planner 2-3x/day. No falls in past 5mos. Pt denies N/V, B&B changes, unexplained weight fluctuation, saddle paresthesia, fever, night sweats, or unrelenting night pain at this time.   PAIN:  Are you having pain? Yes: NPRS scale: 5/10 Pain location: from post/lateral knee across top of her foot to the big toe Pain description: electrical, nagging Aggravating factors: nothing makes it worse, just "randomly" gets worse Relieving factors: NSAIDS, muscle relaxer   PRECAUTIONS: None  WEIGHT BEARING RESTRICTIONS No  FALLS:  Has patient fallen in last 6 months? Yes. Number of falls 1  Fell walking in her neighborhood, rolled her R ankle and fell.  LIVING ENVIRONMENT: Lives with: lives with their spouse Lives in: House/apartment Stairs: Yes: Internal: 2 flighter steps; on left going up and External: 4 steps; bilateral but cannot  reach both Has following equipment at home: None  OCCUPATION: Not working   PLOF: Independent  PATIENT GOALS Decrease tension at muscles that are pinching sciatic nerve   OBJECTIVE:   DIAGNOSTIC FINDINGS:  MRI Feb 2023 1. Focal degenerative disc disease at L5-S1 with mild bilateral foraminal narrowing. No progression since 2018. 2. Tiny left foraminal protrusion at L4-5 since 2018, without neural contact. 3. Widely patent lumbar spinal canal  PATIENT SURVEYS:  FOTO 52 goal 73  SCREENING FOR RED FLAGS: Bowel or bladder incontinence: No Spinal tumors: No Cauda equina syndrome: No Compression fracture: No Abdominal aneurysm: No  COGNITION:  Overall cognitive status: Within functional limits for tasks assessed     SENSATION: WFL  MUSCLE LENGTH: Hamstrings: WNL bilat - increased tension/stretch at end range Maisie Fus test: WNL bilat  POSTURE:  lower crossed  PALPATION: Little to no tension at L lumbar parapinals/QL, or glute musculature. Biggest area of TTP with concordant pain sign to ant tib palpation with increase of redicular pain with palpation here  LUMBAR ROM:   Active /PROM A/PROM  eval  Flexion wnl  Extension WNL  Right lateral flexion WNL with stretch sensation  Left lateral flexion WNL  Right rotation wnl  Left rotation wnl   (Blank rows = not tested)  LOWER EXTREMITY ROM:    All  lower extremity P/AROM WNL with PAIN with active L DF (pain free passive DF)  LOWER EXTREMITY MMT:    MMT Right eval Left eval  Hip flexion 4+ 4+  Hip extension 4 4  Hip abduction 4 4  Hip adduction    Hip internal rotation 5 5  Hip external rotation 5 5  Knee flexion 5 5  Knee extension 5 5  Ankle dorsiflexion 5 5*  Ankle plantarflexion    Ankle inversion    Ankle eversion     (Blank rows = not tested) * = pain Pain with resisted DF; most pain with combined DF + eversion resisted   SL heel raise to failure R: 30 and ceased L 14 with difficulty  LUMBAR  SPECIAL TESTS:  Straight leg raise test: Negative, Slump test: Negative, Single leg stance test: Negative, FABER test:negative, and Thomas test: Negative Tinel sign at peroneal nerve: POSITIVE  FUNCTIONAL TESTS:  5 times sit to stand: 9sec 10 meter walk test: fastest 1.8m/s; self selected 1.32m/s  GAIT: Distance walked: 44meter Assistive device utilized: None Level of assistance: Complete Independence Comments: decreased L heel strike and step length After evaluation pt unable to put weight on LLE for a few steps due to very sensitive pain on the L lower leg    TODAY'S TREATMENT  Manual STM with trigger point release to ant tib and peroneal group muscle bellies Following: Dry Needling: (2/2) 29mm .25 needles placed along the L ant tib and peroneal brevis/longus muscle belly to decrease increased muscular spasms and trigger points with the patient positioned in supine. Patient was educated on risks and benefits of therapy and verbally consents to PT.  Manual ant tib stretch 5sec x6 Manual peroneal brevis/longus stretch 5sec x6   Ther-Ex Combined PF + inv against resistance GTB with eccentric moment back to DF + eversion 2x 12 with good carry ove rof cuing; TC to prevent tibial torsion Toe raise with eccentric lower from with GTB resistance x12 with cuing for eccentric control  Wt'd ball toss (2kg) at rebounder 2x 12 with occassional foot down to prevent LOB Nustep seat 6 LE only L5 27mins with foot straps tight, focus on DF <> PF  Ant tib stretch 30sec hold   PATIENT EDUCATION:  Education details: Patient was educated on diagnosis, anatomy and pathology involved, prognosis, role of PT, and was given an HEP, demonstrating exercise with proper form following verbal and tactile cues, and was given a paper hand out to continue exercise at home. Pt was educated on and agreed to plan of care.  Person educated: Patient Education method: Explanation, Demonstration, and Handouts Education  comprehension: verbalized understanding, returned demonstration, and verbal cues required   HOME EXERCISE PROGRAM: [id number]  ASSESSMENT:  CLINICAL IMPRESSION: PT continued use of manual and TPDN for ant release; and ant tib and peroneal eccentric strengthening focus. Patient  reports no n/t symptoms end of session; mild soreness. Pt is able to comply with all cuing for proper technique of therex with excellent carry over. PT will continue progression as able.     OBJECTIVE IMPAIRMENTS Abnormal gait, decreased activity tolerance, decreased balance, decreased endurance, decreased mobility, difficulty walking, decreased ROM, decreased strength, increased fascial restrictions, increased muscle spasms, impaired flexibility, improper body mechanics, postural dysfunction, and obesity.   ACTIVITY LIMITATIONS lifting, bending, sitting, squatting, and transfers; walking  PARTICIPATION LIMITATIONS: meal prep, cleaning, driving, shopping, community activity, and yard work  PERSONAL FACTORS Age, Past/current experiences, Time since onset of injury/illness/exacerbation, and 1 comorbidity: chronic LBP  are also affecting patient's functional outcome.   REHAB POTENTIAL: Good  CLINICAL DECISION MAKING: Evolving/moderate complexity  EVALUATION COMPLEXITY: Moderate   GOALS: Goals reviewed with patient? Yes  SHORT TERM GOALS: Target date: 10/25/2021  Pt will be independent with HEP in order to improve strength and balance in order to decrease fall risk and improve function at home and work.  Baseline: 09/08/21 HEP given Goal status: INITIAL  LONG TERM GOALS: Target date: 11/22/2021  Patient will increase FOTO score to 73 to demonstrate predicted increase in functional mobility to complete ADLs  Baseline: 09/06/21 52 Goal status: INITIAL  2.  Pt will demonstrate 27 L single leg heel raises to demonstrate 90% strength of L compared to R side to perform heavy  ADLs Baseline: L 14  Goal status:  INITIAL  3.  Pt will subjectively report nerve symptoms 25% of her day or less in order to complete community and ADLs Baseline: Feels discomfort 100% of the day Goal status: INITIAL    PLAN: PT FREQUENCY: 1-2x/week  PT DURATION: 8 weeks  PLANNED INTERVENTIONS: Therapeutic exercises, Therapeutic activity, Neuromuscular re-education, Balance training, Gait training, Patient/Family education, Self Care, Joint mobilization, Joint manipulation, Stair training, Dry Needling, Electrical stimulation, Spinal manipulation, Spinal mobilization, Cryotherapy, Moist heat, Traction, Ultrasound, Manual therapy, and Re-evaluation.  PLAN FOR NEXT SESSION: TPDN, nerve flossing   Hilda Lias DPT Hilda Lias, PT 09/27/2021, 10:03 AM

## 2021-09-28 ENCOUNTER — Encounter: Payer: Commercial Managed Care - HMO | Admitting: Physical Therapy

## 2021-09-29 ENCOUNTER — Ambulatory Visit: Payer: Self-pay | Admitting: Orthopedic Surgery

## 2021-09-29 ENCOUNTER — Encounter: Payer: Commercial Managed Care - HMO | Admitting: Physical Therapy

## 2021-10-03 ENCOUNTER — Ambulatory Visit: Payer: Commercial Managed Care - HMO | Admitting: Physical Therapy

## 2021-10-03 ENCOUNTER — Other Ambulatory Visit: Payer: Self-pay | Admitting: Family

## 2021-10-03 ENCOUNTER — Encounter: Payer: Self-pay | Admitting: Physical Therapy

## 2021-10-03 DIAGNOSIS — M79662 Pain in left lower leg: Secondary | ICD-10-CM | POA: Diagnosis not present

## 2021-10-03 DIAGNOSIS — G4700 Insomnia, unspecified: Secondary | ICD-10-CM

## 2021-10-03 NOTE — Therapy (Signed)
OUTPATIENT PHYSICAL THERAPY THORACOLUMBAR EVALUATION   Patient Name: JALINE PINCOCK MRN: [id number] DOB:[birth year], 61 y.o., female Today's Date: 10/03/2021   PT End of Session - 10/03/21 1053     Visit Number 6    Number of Visits 17    Date for PT Re-Evaluation 11/10/21    Authorization - Visit Number 6    Authorization - Number of Visits 10    PT Start Time 1050    PT Stop Time 1129    PT Time Calculation (min) 39 min    Activity Tolerance Patient tolerated treatment well    Behavior During Therapy [facility] for tasks assessed/performed                History reviewed. No pertinent past medical history. Past Surgical History:  Procedure Laterality Date   CESAREAN SECTION     3x   tummy tuck     VAGINAL DELIVERY     Patient Active Problem List   Diagnosis Date Noted   Cough 05/22/2021   Lumbar radiculopathy 03/06/2021   Arthritis of midfoot 02/15/2021   Somatic dysfunction of spine, lumbar 01/06/2021   Preop examination 05/02/2020   Hives 02/10/2020   B12 deficiency 09/19/2018   SOB (shortness of breath) on exertion 09/17/2018   Spondylosis without myelopathy or radiculopathy, cervical region 02/12/2018   Other intervertebral disc degeneration, lumbar region 02/12/2018   Tendinopathy of left rotator cuff 10/30/2017   Medication refill 10/30/2017   Subacromial bursitis of left shoulder joint 10/30/2017   Premature beats 04/06/2016   Routine general medical examination at a health care facility 12/10/2013   Palpitations 04/14/2013   Insomnia 04/14/2013    PCP: Rennie Plowman FNP  REFERRING PROVIDER: Antoine Primas DO  REFERRING DIAG:   Rationale for Evaluation and Treatment Rehabilitation  THERAPY DIAG:  Pain in left lower leg  ONSET DATE: July 2023  SUBJECTIVE:                                                                                                                                                                                            SUBJECTIVE STATEMENT: Pt reports she felt good following last session with pain coming on the next day. Reports pain returned a day or so later. Currently rates pain 5/10, reporting that she is feeling tingling across the top of her foot and big toe currently.    PERTINENT HISTORY:  Pt is a 61 year old female presenting with chronic LBP with "sciatica pain down LLE". Has had LBP with sciatica over 6 years, but with worsening pain in the past month, with insidious onset. Has had 3  epidural injections without avail (last was May 2023). Reports LBP is L sided, but only really has this pain if she rides in the ca for a while, or while she is sleeping. More so than the back pain, the sciatic symptoms (from post/lateral knee across top of her foot to the big toe) are most debilitating. Has this pain all day, feels electrical in nature. Reports nothing makes the pain worse- it is just constant. Can take NSAID or muscle relaxer to temporarily ease pain. Pt is not working, enjoys walking daily 2-51miles 2x/day, and goes to a [employer] account planner 2-3x/day. No falls in past 33mos. Pt denies N/V, B&B changes, unexplained weight fluctuation, saddle paresthesia, fever, night sweats, or unrelenting night pain at this time.   PAIN:  Are you having pain? Yes: NPRS scale: 5/10 Pain location: from post/lateral knee across top of her foot to the big toe Pain description: electrical, nagging Aggravating factors: nothing makes it worse, just "randomly" gets worse Relieving factors: NSAIDS, muscle relaxer   PRECAUTIONS: None  WEIGHT BEARING RESTRICTIONS No  FALLS:  Has patient fallen in last 6 months? Yes. Number of falls 1  Fell walking in her neighborhood, rolled her R ankle and fell.  LIVING ENVIRONMENT: Lives with: lives with their spouse Lives in: House/apartment Stairs: Yes: Internal: 2 flighter steps; on left going up and External: 4 steps; bilateral but cannot reach both Has following  equipment at home: None  OCCUPATION: Not working   PLOF: Independent  PATIENT GOALS Decrease tension at muscles that are pinching sciatic nerve   OBJECTIVE:   DIAGNOSTIC FINDINGS:  MRI Feb 2023 1. Focal degenerative disc disease at L5-S1 with mild bilateral foraminal narrowing. No progression since 2018. 2. Tiny left foraminal protrusion at L4-5 since 2018, without neural contact. 3. Widely patent lumbar spinal canal  PATIENT SURVEYS:  FOTO 52 goal 73  SCREENING FOR RED FLAGS: Bowel or bladder incontinence: No Spinal tumors: No Cauda equina syndrome: No Compression fracture: No Abdominal aneurysm: No  COGNITION:  Overall cognitive status: Within functional limits for tasks assessed     SENSATION: WFL  MUSCLE LENGTH: Hamstrings: WNL bilat - increased tension/stretch at end range Maisie Fus test: WNL bilat  POSTURE:  lower crossed  PALPATION: Little to no tension at L lumbar parapinals/QL, or glute musculature. Biggest area of TTP with concordant pain sign to ant tib palpation with increase of redicular pain with palpation here  LUMBAR ROM:   Active /PROM A/PROM  eval  Flexion wnl  Extension WNL  Right lateral flexion WNL with stretch sensation  Left lateral flexion WNL  Right rotation wnl  Left rotation wnl   (Blank rows = not tested)  LOWER EXTREMITY ROM:    All  lower extremity P/AROM WNL with PAIN with active L DF (pain free passive DF)  LOWER EXTREMITY MMT:    MMT Right eval Left eval  Hip flexion 4+ 4+  Hip extension 4 4  Hip abduction 4 4  Hip adduction    Hip internal rotation 5 5  Hip external rotation 5 5  Knee flexion 5 5  Knee extension 5 5  Ankle dorsiflexion 5 5*  Ankle plantarflexion    Ankle inversion    Ankle eversion     (Blank rows = not tested) * = pain Pain with resisted DF; most pain with combined DF + eversion resisted   SL heel raise to failure R: 30 and ceased L 14 with difficulty  LUMBAR SPECIAL TESTS:  Straight  leg raise test: Negative, Slump test: Negative, Single leg stance test: Negative, FABER test:negative, and Thomas test: Negative Tinel sign at peroneal nerve: POSITIVE  FUNCTIONAL TESTS:  5 times sit to stand: 9sec 10 meter walk test: fastest 1.11m/s; self selected 1.5m/s  GAIT: Distance walked: Assistive device utilized: None Level of assistance: Complete Independence Comments: decreased L heel strike and step length After evaluation pt unable to put weight on LLE for a few steps due to very sensitive pain on the L lower leg    TODAY'S TREATMENT  Manual STM with trigger point release to ant tib and peroneal group muscle bellies Following: Dry Needling: (2/2) 88mm .25 needles placed along the L ant tib and peroneal brevis/longus muscle belly to decrease increased muscular spasms and trigger points with the patient positioned in supine. Patient was educated on risks and benefits of therapy and verbally consents to PT.  Manual ant tib stretch 5sec x6 Manual peroneal brevis/longus stretch 5sec x6   Ther-Ex Wt'd ball toss (2kg) at rebounder on foam 2x 12 with occassional foot down to prevent LOB Toe raise with eccentric lower from with GTB resistance x12 with cuing for eccentric control  Bosu ball hardside PF <> DF x12 with cuing for eccentric control Squat on bosu hardside 2x 12 with cuing to prevent knee valgus with decent carry over Nustep seat 6 LE only L5 with foot straps tight, focus on DF <> PF  Ant tib stretch 30sec hold   PATIENT EDUCATION:  Education details: Patient was educated on diagnosis, anatomy and pathology involved, prognosis, role of PT, and was given an HEP, demonstrating exercise with proper form following verbal and tactile cues, and was given a paper hand out to continue exercise at home. Pt was educated on and agreed to plan of care.  Person educated: Patient Education method: Explanation, Demonstration, and Handouts Education comprehension:  verbalized understanding, returned demonstration, and verbal cues required   HOME EXERCISE PROGRAM: [id number]  ASSESSMENT:  CLINICAL IMPRESSION: PT continued use of manual and TPDN for ant release; and ant tib and peroneal stabilization and eccentric strengthening focus. Patient  reports no n/t symptoms end of session; mild soreness. Pt is able to comply with all cuing for proper technique of therex with excellent carry over. PT will continue progression as able.     OBJECTIVE IMPAIRMENTS Abnormal gait, decreased activity tolerance, decreased balance, decreased endurance, decreased mobility, difficulty walking, decreased ROM, decreased strength, increased fascial restrictions, increased muscle spasms, impaired flexibility, improper body mechanics, postural dysfunction, and obesity.   ACTIVITY LIMITATIONS lifting, bending, sitting, squatting, and transfers; walking  PARTICIPATION LIMITATIONS: meal prep, cleaning, driving, shopping, community activity, and yard work  PERSONAL FACTORS Age, Past/current experiences, Time since onset of injury/illness/exacerbation, and 1 comorbidity: chronic LBP  are also affecting patient's functional outcome.   REHAB POTENTIAL: Good  CLINICAL DECISION MAKING: Evolving/moderate complexity  EVALUATION COMPLEXITY: Moderate   GOALS: Goals reviewed with patient? Yes  SHORT TERM GOALS: Target date: 10/31/2021  Pt will be independent with HEP in order to improve strength and balance in order to decrease fall risk and improve function at home and work.  Baseline: 09/08/21 HEP given Goal status: INITIAL  LONG TERM GOALS: Target date: 11/28/2021  Patient will increase FOTO score to 73 to demonstrate predicted increase in functional mobility to complete ADLs  Baseline: 09/06/21 52 Goal status: INITIAL  2.  Pt will demonstrate 27 L single leg heel raises to demonstrate 90% strength of L compared to  R side to perform heavy ADLs Baseline: L 14  Goal status:  INITIAL  3.  Pt will subjectively report nerve symptoms 25% of her day or less in order to complete community and ADLs Baseline: Feels discomfort 100% of the day Goal status: INITIAL    PLAN: PT FREQUENCY: 1-2x/week  PT DURATION: 8 weeks  PLANNED INTERVENTIONS: Therapeutic exercises, Therapeutic activity, Neuromuscular re-education, Balance training, Gait training, Patient/Family education, Self Care, Joint mobilization, Joint manipulation, Stair training, Dry Needling, Electrical stimulation, Spinal manipulation, Spinal mobilization, Cryotherapy, Moist heat, Traction, Ultrasound, Manual therapy, and Re-evaluation.  PLAN FOR NEXT SESSION: TPDN, nerve flossing   Hilda Lias DPT Hilda Lias, PT 10/03/2021, 1:44 PM

## 2021-10-03 NOTE — Telephone Encounter (Signed)
Spoke to patient. She is scheduled to see her Neurologist on Thursday. She said she would check with him as well to see his thoughts and she will let us know what she decides.

## 2021-10-04 ENCOUNTER — Encounter: Payer: Self-pay | Admitting: Neurosurgery

## 2021-10-04 ENCOUNTER — Ambulatory Visit: Payer: Commercial Managed Care - HMO | Admitting: Physical Therapy

## 2021-10-04 NOTE — Progress Notes (Signed)
Referring Physician:  No referring provider defined for this encounter.  Primary Physician:  Allegra Grana, FNP  History of Present Illness: 10/04/2021 [name] is here today with a chief complaint of pain from posterior lateral knee, across top of her foot to big toe  She has been having this issue for 6 years.  She has electrical nagging pain in her left leg that extends to her anterolateral calf into the top of her foot and her great toe.  Walking, standing, and sitting make it worse.  She denies history of trauma.  She does not have any back pain.  She has no buttock pain.   Bowel/Bladder Dysfunction: none  Conservative measures:  Physical therapy: currently participating at [facility] since 09/06/21 (6 visits completed) Multimodal medical therapy including regular antiinflammatories: diclofenac, gabapentin, metaxalone Injections:  has received epidural steroid injections 05/24/21: left L5 nerve root block and transforaminal epidural at [facility] 04/26/21: left L5 nerve root block and transforaminal epidural at [facility] 04/06/21: left L5 nerve root block and transforaminal epidural at [facility] 03/20/21: MBB for left L4-5 facet radiofrequency ablation at [facility] 11/17/20: left L5-S1 ESI at [facility] 03/30/20: left L5-S1 ESI at [facility] 07/30/19: left L5-S1 ESI at [facility] 04/21/19: left L5-S1 ESI at [facility] 11/24/18: left S1 selective nerve root block and transforaminal epidural steroid injection at [facility] 09/15/18: left S1 selective nerve root block and transforaminal epidural steroid injection at [facility]   Past Surgery: denies  [place] has no symptoms of cervical myelopathy.  The symptoms are causing a significant impact on the patient's life.   Review of Systems:  A 10 point review of systems is negative, except for the pertinent positives and negatives detailed  in the HPI.  Past Medical History: No past medical history on file.  Past Surgical History: Past Surgical History:  Procedure Laterality Date   CESAREAN SECTION     3x   tummy tuck     VAGINAL DELIVERY      Allergies: Allergies as of 10/05/2021 - Review Complete 10/03/2021  Allergen Reaction Noted   Codeine Nausea Only 04/14/2013    Medications: No outpatient medications have been marked as taking for the 10/05/21 encounter (Appointment) with Venetia Night, MD.    Social History: Social History   Tobacco Use   Smoking status: Never   Smokeless tobacco: Never  Vaping Use   Vaping Use: Never used  Substance Use Topics   Alcohol use: Yes   Drug use: No    Family Medical History: Family History  Problem Relation Age of Onset   Epilepsy Son    Cancer Paternal Grandmother        breast   Cancer Maternal Aunt        lung    Physical Examination: There were no vitals filed for this visit.  General: Patient is well developed, well nourished, calm, collected, and in no apparent distress. Attention to examination is appropriate.  Neck:   Supple.  Full range of motion.  Respiratory: Patient is breathing without any difficulty.   NEUROLOGICAL:     Awake, alert, oriented to person, place, and time.  Speech is clear and fluent. Fund of knowledge is appropriate.   Cranial Nerves: Pupils equal round and reactive to light.  Facial tone is symmetric.  Facial sensation is symmetric. Shoulder shrug is symmetric. Tongue protrusion is midline.  There is no pronator drift.  ROM of spine: full.  Strength: Side Biceps Triceps Deltoid Interossei Grip Wrist Ext. Wrist Flex.  R 5 5 5 5 5 5 5   L 5 5 5 5 5 5 5    Side Iliopsoas Quads Hamstring PF DF EHL  R 5 5 5 5 5 5   L 5 5 5 5 5 5    Reflexes are 1+ and symmetric at the biceps, triceps, brachioradialis, patella and achilles.   Hoffman's is absent.  Clonus is not present.  Toes are down-going.  Bilateral upper and  lower extremity sensation is intact to light touch.  She is uncomfortable to touch in the L peroneal distribution  No evidence of dysmetria noted.  Gait is normal.    Medical Decision Making  Imaging: MRI L spine 03/08/21 Disc levels:   Focal degenerative disc narrowing, bulging, and ridging at L5-S1. Associated crowding of the foramina but no L5 root flattening. Diffusely patent spinal canal.   L4-5 tiny left foraminal protrusion without neural contact, see sagittal T2 weighted imaging   IMPRESSION: 1. Focal degenerative disc disease at L5-S1 with mild bilateral foraminal narrowing. No progression since 2018. 2. Tiny left foraminal protrusion at L4-5 since 2018, without neural contact. 3. Widely patent lumbar spinal canal.     Electronically Signed   By: M.D.   On: 03/09/2021 08:48  I have personally reviewed the images and agree with the above interpretation.  Assessment and Plan: Ms. Round is a pleasant 61 y.o. female with symptoms concerning for left peroneal palsy.  We will get a nerve conduction study and increase her gabapentin to 300 mg in a day divided in 3 doses.  If this does not help her pain, we can slowly increase her dosage of gabapentin to achieve some pain relief.  If her nerve conduction study confirms a peroneal palsy, I will likely refer her to one of my colleagues who specializes in peripheral nerve surgery for possible decompression.  If she has a chronic radiculopathy and fails neuropathic pain meds, the other consideration would be either peripheral or central stimulation for pain.  I spent a total of 30 minutes in face-to-face and non-face-to-face activities related to this patient's care today.  Thank you for involving me in the care of this patient.      Jamesa Tedrick K. Tiburcio Pea MD, [facility] Neurosurgery

## 2021-10-05 ENCOUNTER — Encounter: Payer: Self-pay | Admitting: Neurosurgery

## 2021-10-05 ENCOUNTER — Ambulatory Visit: Payer: Commercial Managed Care - HMO | Admitting: Neurosurgery

## 2021-10-05 VITALS — BP 126/74 | Ht 62.0 in | Wt 127.0 lb

## 2021-10-05 DIAGNOSIS — G5732 Lesion of lateral popliteal nerve, left lower limb: Secondary | ICD-10-CM | POA: Diagnosis not present

## 2021-10-06 ENCOUNTER — Encounter: Payer: Commercial Managed Care - HMO | Admitting: Physical Therapy

## 2021-10-09 ENCOUNTER — Encounter: Payer: Commercial Managed Care - HMO | Admitting: Physical Therapy

## 2021-10-09 NOTE — Telephone Encounter (Signed)
Patient called to give Dr Tamala Julian an update. She saw her Neurosurgeon and was sent to Dr Brigitte Pulse for a nerve study test from the knee down. They are thinking her peroneal nerve in lower leg is damaged or pressed. She is being sent to a Neuro Surgeon at [facility] for an MRI and potential surgery.

## 2021-10-10 ENCOUNTER — Encounter: Payer: Commercial Managed Care - HMO | Admitting: Physical Therapy

## 2021-10-10 ENCOUNTER — Ambulatory Visit: Payer: Self-pay | Admitting: Orthopedic Surgery

## 2021-10-11 ENCOUNTER — Encounter: Payer: Commercial Managed Care - HMO | Admitting: Physical Therapy

## 2021-10-11 ENCOUNTER — Ambulatory Visit (INDEPENDENT_AMBULATORY_CARE_PROVIDER_SITE_OTHER): Payer: Commercial Managed Care - HMO | Admitting: Dermatology

## 2021-10-11 ENCOUNTER — Ambulatory Visit: Payer: Commercial Managed Care - HMO | Admitting: Dermatology

## 2021-10-11 ENCOUNTER — Other Ambulatory Visit: Payer: Self-pay

## 2021-10-11 DIAGNOSIS — L509 Urticaria, unspecified: Secondary | ICD-10-CM | POA: Diagnosis not present

## 2021-10-11 MED ORDER — BIMATOPROST 0.03 % EX SOLN: 1.0000 "application " | Freq: Every day | CUTANEOUS | 12 refills | Status: DC

## 2021-10-11 MED ORDER — OMALIZUMAB 150 MG/ML ~~LOC~~ SOSY
300.0000 mg | PREFILLED_SYRINGE | Freq: Once | SUBCUTANEOUS | Status: AC
  Administered 2021-10-11: 300 mg via SUBCUTANEOUS

## 2021-10-11 NOTE — Progress Notes (Signed)
Patient here for restart of her Xolair injection. Xolair 150mg  injected into each arm, left and right. Patient tolerated well.  3 month followup scheduled with Dr. Laurence Ferrari    LOT:  [id number] EXP: 10/2021   Blood Pressure before injections: 104/68 15 minutes after: 94/60 30 minutes after: 99/67   Dicie Beam, RMA  Documentation: I have reviewed the above documentation for accuracy and completeness, and I agree with the above.  Forest Gleason, MD

## 2021-10-11 NOTE — Progress Notes (Signed)
Spoke with patient on the phone about her [place] appointment today. Per previous note patient to see nurse today for injection and follow up with Dr.Moye in three months. We will order a new prescription through [place] since her insurance has changed. Patient requesting prescription refill on [place] and I printed her prescription refill to take with her to pharmacy.

## 2021-10-12 ENCOUNTER — Other Ambulatory Visit: Payer: Self-pay

## 2021-10-12 ENCOUNTER — Encounter: Payer: Commercial Managed Care - HMO | Admitting: Physical Therapy

## 2021-10-12 DIAGNOSIS — L509 Urticaria, unspecified: Secondary | ICD-10-CM

## 2021-10-12 MED ORDER — OMALIZUMAB 150 MG/ML ~~LOC~~ SOSY: PREFILLED_SYRINGE | SUBCUTANEOUS | 3 refills | Status: DC

## 2021-10-12 NOTE — Progress Notes (Signed)
Patient wants to restart Xolair. Escripted to [facility]

## 2021-10-14 ENCOUNTER — Encounter: Payer: Self-pay | Admitting: Dermatology

## 2021-10-16 ENCOUNTER — Other Ambulatory Visit: Payer: Self-pay

## 2021-10-16 ENCOUNTER — Ambulatory Visit: Payer: Commercial Managed Care - HMO | Admitting: Physical Therapy

## 2021-10-16 DIAGNOSIS — L509 Urticaria, unspecified: Secondary | ICD-10-CM

## 2021-10-16 MED ORDER — OMALIZUMAB 150 MG/ML ~~LOC~~ SOSY: PREFILLED_SYRINGE | SUBCUTANEOUS | 3 refills | Status: DC

## 2021-10-16 NOTE — Progress Notes (Signed)
Preferred pharmacy per insurance is [facility]. Escripted

## 2021-10-18 ENCOUNTER — Telehealth: Payer: Self-pay

## 2021-10-18 NOTE — Telephone Encounter (Signed)
Called pt to discuss Xolair will be delivered here in the office 10/19/21

## 2021-10-19 ENCOUNTER — Encounter: Payer: Commercial Managed Care - HMO | Admitting: Physical Therapy

## 2021-10-25 ENCOUNTER — Telehealth: Payer: Self-pay

## 2021-10-25 NOTE — Telephone Encounter (Signed)
I notified the patient of Dr Rhea Bleacher response. She was agreeable to this plan.

## 2021-10-25 NOTE — Telephone Encounter (Signed)
-----   Message from Peggyann Shoals sent at 10/25/2021 11:21 AM EDT ----- Regarding: order MRI Contact: [phone] Patient has an appt with Dr. Tyler Pita on 11/30/2021. Dr.Shah did the EMG and mentioned to her getting a new MRI, but he can not order it because he only did the EMG. Can Dr.Yarbrough order the MRI so she can have that before her appt with Dr.Smith? That way if she needs surgery there will not be a delay in getting it scheduled.  She would like to have it done at [facility] either office.

## 2021-11-02 ENCOUNTER — Other Ambulatory Visit: Payer: Self-pay | Admitting: Family Medicine

## 2021-11-06 ENCOUNTER — Telehealth: Payer: Self-pay

## 2021-11-06 NOTE — Telephone Encounter (Signed)
Patient called wanting to talk to Mateo Flow about ordering an MRI from the left knee down because she is trying to get surgery and the surgeon will not do any surgery until he has this imaging but the insurance needs the clinicals notes from our office in order to approve this/these orders. Patient wants to know if Dr. Tamala Julian is okay with ordering this for her so she can get this done and then get this surgery. If there are any other questions about why they are needing this imaging patient is okay with a call back.

## 2021-11-07 NOTE — Telephone Encounter (Signed)
Sent patient MyChart message about scheduling appt.

## 2021-11-07 NOTE — Telephone Encounter (Signed)
Patient called and will be out of town the options we had for her she will call back/we will call if we have any other cancellations.

## 2021-11-13 NOTE — Progress Notes (Signed)
[place] [address] [place] Phone: [phone] Subjective:   [place], am serving as a scribe for Dr. Hulan Anderson.  I'm seeing this patient by the request  of:  Jodi Hawthorne, FNP  CC: Left knee and leg pain follow-up  [id number]  06/14/2021 Patient doing significantly better after 3 injections.  Hopefully patient will continue to do this well.  Refilled gabapentin so patient can titrate down from 300 mg at night and he is more intermittently.  We discussed use it is still the anti-inflammatory as well when needed and using a 7-day burst.  As long as patient does well otherwise can follow-up with me as needed.  Update 11/14/2021 [place] is a 61 y.o. female coming in with complaint of back and knee pain. Nerve root injection in May. Patient states would like an MRI done of her lower left leg for neuro. Patient did have a nerve conduction study showing an L5 radiculopathy but also found to have a fibular nerve impingement near the fibular neck is a possibility of concern.  Patient states that unfortunately left knee seems to give her more instability.  Tries to increase activity but feels like sometimes the knee is not stable.  Questionable swelling.  Affecting daily activities.  Has done formal physical therapy, x-rays were further reviewed again that were taken in December 2022 when patient first discussed this difficulty.      Past Medical History:  Diagnosis Date   Cardiac murmur    Palpitations    Past Surgical History:  Procedure Laterality Date   CESAREAN SECTION     3x   COLONOSCOPY  2016   PINNING FOR LEFT TOE FRACTURE     tummy tuck     Social History   Socioeconomic History   Marital status: Married    Spouse name: Not on file   Number of children: Not on file   Years of education: Not on file   Highest education level: Not on file  Occupational History   Not on file  Tobacco Use    Smoking status: Never   Smokeless tobacco: Never  Vaping Use   Vaping Use: Never used  Substance and Sexual Activity   Alcohol use: Yes   Drug use: No   Sexual activity: Not on file  Other Topics Concern   Not on file  Social History Narrative   Lives in [place]. 4 children    1 son in program at [place] has autism, 60 years old, lives in home in [place]. Has dog and 3 cats.      Work - homemaker      Diet - regular diet      Exercise - walks daily, weights, trampoline   Social Determinants of Health   Financial Resource Strain: Not on file  Food Insecurity: Not on file  Transportation Needs: Not on file  Physical Activity: Not on file  Stress: Not on file  Social Connections: Not on file   Allergies  Allergen Reactions   Codeine Nausea Only   Family History  Problem Relation Age of Onset   Epilepsy Son    Cancer Paternal Grandmother        breast   Cancer Maternal Aunt        lung      Current Outpatient Medications (Respiratory):    omalizumab [place]) 150 MG/ML prefilled syringe, INJECT 300MG  SUBCUTANEOUSLY  EVERY 4 WEEKS  Current Outpatient Medications (  Analgesics):    diclofenac (VOLTAREN) 75 MG EC tablet, Take 1 tablet (75 mg total) by mouth 2 (two) times daily.   Current Outpatient Medications (Other):    bimatoprost (LATISSE) 0.03 % ophthalmic solution, Place 1 application  into both eyes at bedtime. Place one drop on applicator and apply evenly along the skin of the upper eyelid at base of eyelashes once daily at bedtime; repeat procedure for second eye (use a clean applicator).   gabapentin (NEURONTIN) 100 MG capsule, Take 2 capsules (200 mg total) by mouth 2 (two) times daily.   gabapentin (NEURONTIN) 300 MG capsule, TAKE 1 CAPSULE BY MOUTH AT BEDTIME   metaxalone (SKELAXIN) 800 MG tablet, TAKE 1 TABLET BY MOUTH TWICE DAILY AS NEEDED FOR MUSCLE SPASMS   triamcinolone cream (KENALOG) 0.1 %, APPLY TO AFFECTED AREAS WITH ECZEMA TWICE DAILY AS NEEDED FOR UP TO  14 DAYS. AVOID FACE, GROIN AND AXILLA.  RISK OF SKIN ATROPHY WITH LONG USE REVIEWED.   zolpidem (AMBIEN) 10 MG tablet, TAKE 1 TABLET BY MOUTH AT BEDTIME AS NEEDED FOR SLEEP   Reviewed prior external information including notes and imaging from  primary care provider As well as notes that were available from care everywhere and other healthcare systems.  Past medical history, social, surgical and family history all reviewed in electronic medical record.  No pertanent information unless stated regarding to the chief complaint.   Review of Systems:  No headache, visual changes, nausea, vomiting, diarrhea, constipation, dizziness, abdominal pain, skin rash, fevers, chills, night sweats, weight loss, swollen lymph nodes, body aches, joint swelling, chest pain, shortness of breath, mood changes. POSITIVE muscle aches  Objective  Blood pressure 106/68, pulse 76, height 5\' 2"  (1.575 m), weight 128 lb (58.1 kg), last menstrual period 06/23/2014, SpO2 98 %.   General: No apparent distress alert and oriented x3 mood and affect normal, dressed appropriately.  HEENT: Pupils equal, extraocular movements intact  Respiratory: Patient's speak in full sentences and does not appear short of breath  Cardiovascular: No lower extremity edema, non tender, no erythema  Antalgic gait  Left knee exam does have some instability noted with valgus and varus force.  Tender to palpation over the lateral line. Patient does have some atrophy also noted of the calf musculature.  Does have 4-5 strength with dorsiflexion on the left.   Impression and Recommendations:     The above documentation has been reviewed and is accurate and complete [place] Pulley, DO

## 2021-11-14 ENCOUNTER — Ambulatory Visit (INDEPENDENT_AMBULATORY_CARE_PROVIDER_SITE_OTHER): Payer: Commercial Managed Care - HMO | Admitting: Dermatology

## 2021-11-14 ENCOUNTER — Ambulatory Visit (INDEPENDENT_AMBULATORY_CARE_PROVIDER_SITE_OTHER): Payer: Commercial Managed Care - HMO | Admitting: Family Medicine

## 2021-11-14 VITALS — BP 106/68 | HR 76 | Ht 62.0 in | Wt 128.0 lb

## 2021-11-14 DIAGNOSIS — M2352 Chronic instability of knee, left knee: Secondary | ICD-10-CM | POA: Insufficient documentation

## 2021-11-14 DIAGNOSIS — M79662 Pain in left lower leg: Secondary | ICD-10-CM | POA: Diagnosis not present

## 2021-11-14 DIAGNOSIS — M25562 Pain in left knee: Secondary | ICD-10-CM

## 2021-11-14 DIAGNOSIS — L509 Urticaria, unspecified: Secondary | ICD-10-CM | POA: Diagnosis not present

## 2021-11-14 MED ORDER — OMALIZUMAB 150 MG/ML ~~LOC~~ SOSY
300.0000 mg | PREFILLED_SYRINGE | Freq: Once | SUBCUTANEOUS | Status: AC
  Administered 2021-11-14: 300 mg via SUBCUTANEOUS

## 2021-11-14 NOTE — Patient Instructions (Addendum)
[facility] [phone] Call Today  When we receive your results we will contact you.  

## 2021-11-14 NOTE — Assessment & Plan Note (Addendum)
Patient is this pain on and off for nearly a year.  Now has some atrophy of the calf musculature as well as some weakness with dorsiflexion.  There is a history of a lumbar radiculopathy but with her back pain feeling much better and this is concerning for either instability of the knee causing more fibular compression with patient having some weakness noted.  Patient did have a nerve conduction study showing that she did have a chronic L5 lumbar radiculopathy versus the possibility of a fibular nerve lesion.  With the instability of the knee I do feel that advanced imaging is warranted with patient already failing injections, medications including oral anti-inflammatories such as the diclofenac 75 mg twice daily and the gabapentin 300 mg.  Depending on findings of the MRI this could be a potential surgical condition.  Follow-up with me again after imaging to discuss further.

## 2021-11-14 NOTE — Progress Notes (Unsigned)
Patient here for her 4 week injection of Xolair for Urticaria.   Xolair 150mg  injected into both right and left upper arms.Patient tolerated procedure well.   BP before injection: 97/63 BP 15 minutes after injection: 96/65 BP 30 minutes after injection: 90/64  JOI:[id number] EXP: 07/2022 [id number]  Abby Foster RMA  Documentation: I have reviewed the above documentation for accuracy and completeness, and I agree with the above.  Forest Gleason, MD

## 2021-11-15 ENCOUNTER — Encounter: Payer: Self-pay | Admitting: Dermatology

## 2021-11-23 ENCOUNTER — Ambulatory Visit
Admission: RE | Admit: 2021-11-23 | Discharge: 2021-11-23 | Disposition: A | Discharge status: Home / Self Care | Payer: Commercial Managed Care - HMO | Source: Ambulatory Visit | Attending: Family Medicine | Admitting: Family Medicine

## 2021-11-23 DIAGNOSIS — M79662 Pain in left lower leg: Secondary | ICD-10-CM

## 2021-11-23 DIAGNOSIS — M25562 Pain in left knee: Secondary | ICD-10-CM

## 2021-11-24 ENCOUNTER — Other Ambulatory Visit: Payer: Commercial Managed Care - HMO

## 2021-11-27 ENCOUNTER — Telehealth: Payer: Commercial Managed Care - HMO | Admitting: Family Medicine

## 2021-11-27 ENCOUNTER — Telehealth: Payer: Self-pay | Admitting: Family Medicine

## 2021-11-27 NOTE — Telephone Encounter (Signed)
Spoke with patient. She would like to try the injection. Placed on cancellation list.

## 2021-11-27 NOTE — Telephone Encounter (Signed)
Patient called in response to the MRI results.  She was having trouble responding back through [place].  She wanted to know what the next steps would be?

## 2021-12-05 ENCOUNTER — Telehealth: Payer: Self-pay

## 2021-12-05 ENCOUNTER — Ambulatory Visit: Payer: Commercial Managed Care - HMO | Admitting: Family Medicine

## 2021-12-05 VITALS — BP 100/70 | HR 83 | Ht 62.0 in

## 2021-12-05 DIAGNOSIS — M2352 Chronic instability of knee, left knee: Secondary | ICD-10-CM | POA: Diagnosis not present

## 2021-12-05 MED ORDER — METAXALONE 800 MG PO TABS: 800.0000 mg | ORAL_TABLET | Freq: Two times a day (BID) | ORAL | 1 refills | Status: DC | PRN

## 2021-12-05 MED ORDER — GABAPENTIN 100 MG PO CAPS: 200.0000 mg | ORAL_CAPSULE | Freq: Three times a day (TID) | ORAL | 1 refills | Status: DC

## 2021-12-05 NOTE — Assessment & Plan Note (Signed)
Patient given injection of the knee noted.  Discussed with patient about icing regimen and home exercises, we discussed which activities to do and which ones to avoid.  Patient's was to do a Tru pull lite brace but left without it.  We discussed with which activities to do and which ones to avoid.  Patient I am still concerned that he is actually having more of a left-sided lumbar radiculopathy with chronic L5 radiculopathy.  Patient has seen a neurosurgeon in 2 weeks and will discuss with them to see if anything else is necessary.  We will follow-up again in 6 to 8 weeks.

## 2021-12-05 NOTE — Patient Instructions (Addendum)
Good to see you Injection in left knee today Refilled the gabapentin and muscle relaxer  Patella femoral brace (patient left if calls back we can have her come back without a visit) Follow up in 6-8 weeks

## 2021-12-05 NOTE — Progress Notes (Unsigned)
[facility] [address] [place] [address] Phone: [phone] Subjective:    I'm seeing this patient by the request  of:  Allegra Grana, FNP  CC: Left knee pain  ERD:[id number]  [place] is a 61 y.o. female coming in with complaint of Left nee pain and would like to get an injection based on what the MRI showed. Knee is still bothering her but the pain shoots from bottom of patella to her big toe. Patient also wanting to see if she is still supposed to be on the gabapentin if so she has been out of it.  MRI of the knee showed the patient did have some lateral tracking of the kneecap with some impingement noted.  Some mild inflammation noted of the anterior fat pad.      Past Medical History:  Diagnosis Date   Cardiac murmur    Palpitations    Past Surgical History:  Procedure Laterality Date   CESAREAN SECTION     3x   COLONOSCOPY  2016   PINNING FOR LEFT TOE FRACTURE     tummy tuck     Social History   Socioeconomic History   Marital status: Married    Spouse name: Not on file   Number of children: Not on file   Years of education: Not on file   Highest education level: Not on file  Occupational History   Not on file  Tobacco Use   Smoking status: Never   Smokeless tobacco: Never  Vaping Use   Vaping Use: Never used  Substance and Sexual Activity   Alcohol use: Yes   Drug use: No   Sexual activity: Not on file  Other Topics Concern   Not on file  Social History Narrative   Lives in [place]. 4 children    1 son in program at [employer] has autism, 2 years old, lives in home in [facility]. Has dog and 3 cats.      Work - homemaker      Diet - regular diet      Exercise - walks daily, weights, trampoline   Social Determinants of Health   Financial Resource Strain: Not on file  Food Insecurity: Not on file  Transportation Needs: Not on file  Physical Activity: Not on file  Stress: Not on file  Social  Connections: Not on file   Allergies  Allergen Reactions   Codeine Nausea Only   Family History  Problem Relation Age of Onset   Epilepsy Son    Cancer Paternal Grandmother        breast   Cancer Maternal Aunt        lung      Current Outpatient Medications (Respiratory):    omalizumab [place]ry [place]) 150 MG/ML prefilled syringe, INJECT 300MG  SUBCUTANEOUSLY  EVERY 4 WEEKS  Current Outpatient Medications (Analgesics):    diclofenac (VOLTAREN) 75 MG EC tablet, Take 1 tablet (75 mg total) by mouth 2 (two) times daily.   Current Outpatient Medications (Other):    bimatoprost (LATISSE) 0.03 % ophthalmic solution, Place 1 application  into both eyes at bedtime. Place one drop on applicator and apply evenly along the skin of the upper eyelid at base of eyelashes once daily at bedtime; repeat procedure for second eye (use a clean applicator).   triamcinolone cream (KENALOG) 0.1 %, APPLY TO AFFECTED AREAS WITH ECZEMA TWICE DAILY AS NEEDED FOR UP TO 14 DAYS. AVOID FACE, GROIN AND AXILLA.  RISK OF  SKIN ATROPHY WITH LONG USE REVIEWED.   zolpidem (AMBIEN) 10 MG tablet, TAKE 1 TABLET BY MOUTH AT BEDTIME AS NEEDED FOR SLEEP   gabapentin (NEURONTIN) 100 MG capsule, Take 2 capsules (200 mg total) by mouth 3 (three) times daily.   metaxalone (SKELAXIN) 800 MG tablet, Take 1 tablet (800 mg total) by mouth 2 (two) times daily as needed for muscle spasms.   Reviewed prior external information including notes and imaging from  primary care provider As well as notes that were available from care everywhere and other healthcare systems.  Past medical history, social, surgical and family history all reviewed in electronic medical record.  No pertanent information unless stated regarding to the chief complaint.   Review of Systems:  No headache, visual changes, nausea, vomiting, diarrhea, constipation, dizziness, abdominal pain, skin rash, fevers, chills, night sweats, weight loss, swollen lymph nodes, body  aches, joint swelling, chest pain, shortness of breath, mood changes. POSITIVE muscle aches  Objective  Blood pressure 100/70, pulse 83, height 5\' 2"  (1.575 m), last menstrual period 06/23/2014, SpO2 98 %.   General: No apparent distress alert and oriented x3 mood and affect normal, dressed appropriately.  HEENT: Pupils equal, extraocular movements intact  Respiratory: Patient's speak in full sentences and does not appear short of breath  Cardiovascular: No lower extremity edema, non tender, no erythema  Left knee does have some lateral tracking noted.  Mild positive patellar grind test noted.  Tenderness over the anterior fat pad.  After informed written and verbal consent, patient was seated on exam table. Left knee was prepped with alcohol swab and utilizing anterolateral approach, patient's left knee space was injected with 4:1  marcaine 0.5%: Kenalog 40mg /dL. Patient tolerated the procedure well without immediate complications.    Impression and Recommendations:

## 2021-12-05 NOTE — Telephone Encounter (Signed)
Xolair set up for delivery for 12/07/21. aw

## 2021-12-12 ENCOUNTER — Telehealth: Payer: Self-pay

## 2021-12-12 DIAGNOSIS — G4700 Insomnia, unspecified: Secondary | ICD-10-CM

## 2021-12-12 MED ORDER — ZOLPIDEM TARTRATE 10 MG PO TABS: 10.0000 mg | ORAL_TABLET | Freq: Every evening | ORAL | 2 refills | Status: DC | PRN

## 2021-12-12 NOTE — Telephone Encounter (Signed)
Call patient [place] is refilled. Please ask her to use mychart and request through RX refill 7 to 10 days prior to her with refill as needed.  Please educate her that it takes 3 days for refill request to be processed.   Ensure she has follow-up with me 3 months from her last appointment so I can continue to prescribe [place]

## 2021-12-12 NOTE — Telephone Encounter (Signed)
Pt requesting RF on her zolpidem (AMBIEN) 10 MG tablet [facility]. Pt requesting callback....Marland KitchenMarland Kitchen

## 2021-12-12 NOTE — Addendum Note (Signed)
Addended by: Allegra Grana on: 12/12/2021 12:25 PM   Modules accepted: Orders

## 2021-12-12 NOTE — Telephone Encounter (Signed)
Spoke to pt and informed her that refill was sent in and that she needs to allow 3 days for refills to be processed and  to not wait until last minute to ask about refills

## 2021-12-18 ENCOUNTER — Ambulatory Visit (INDEPENDENT_AMBULATORY_CARE_PROVIDER_SITE_OTHER): Payer: Commercial Managed Care - HMO | Admitting: Dermatology

## 2021-12-18 DIAGNOSIS — L509 Urticaria, unspecified: Secondary | ICD-10-CM | POA: Diagnosis not present

## 2021-12-18 MED ORDER — OMALIZUMAB 150 MG/ML ~~LOC~~ SOSY
300.0000 mg | PREFILLED_SYRINGE | Freq: Once | SUBCUTANEOUS | Status: AC
  Administered 2021-12-18: 300 mg via SUBCUTANEOUS

## 2021-12-18 NOTE — Progress Notes (Unsigned)
Patient here for her 4 week injection of Xolair for Urticaria.    Xolair 150mg  injected into both right and left upper arms.Patient tolerated procedure well.    BP before injection: 107/76 BP 15 minutes after injection: 105/76 BP 30 minutes after injection: 103/72   EXP: 08/2022 [id number]   09/2022, RMA     Documentation: I have reviewed the above documentation for accuracy and completeness, and I agree with the above.  Dorathy Daft, MD

## 2021-12-19 ENCOUNTER — Encounter: Payer: Self-pay | Admitting: Dermatology

## 2022-01-10 ENCOUNTER — Encounter: Payer: Self-pay | Admitting: Dermatology

## 2022-01-10 ENCOUNTER — Ambulatory Visit (INDEPENDENT_AMBULATORY_CARE_PROVIDER_SITE_OTHER): Payer: Commercial Managed Care - HMO | Admitting: Dermatology

## 2022-01-10 DIAGNOSIS — L501 Idiopathic urticaria: Secondary | ICD-10-CM | POA: Diagnosis not present

## 2022-01-10 DIAGNOSIS — L905 Scar conditions and fibrosis of skin: Secondary | ICD-10-CM

## 2022-01-10 DIAGNOSIS — L570 Actinic keratosis: Secondary | ICD-10-CM

## 2022-01-10 DIAGNOSIS — L578 Other skin changes due to chronic exposure to nonionizing radiation: Secondary | ICD-10-CM | POA: Diagnosis not present

## 2022-01-10 MED ORDER — OMALIZUMAB 150 MG/ML ~~LOC~~ SOSY
300.0000 mg | PREFILLED_SYRINGE | Freq: Once | SUBCUTANEOUS | Status: AC
  Administered 2022-01-10: 300 mg via SUBCUTANEOUS

## 2022-01-10 NOTE — Progress Notes (Signed)
Follow-Up Visit   Subjective  Jodi Anderson is a 61 y.o. female who presents for the following: Urticaria (3 month follow up. On Xolair since 05/11/2020. Did stop for a while but had breakthrough hives.Tolerating well. Controlling symptoms well. No adverse reactions or injection site reactions. Does still get some hives at times, very few and resolves quickly).    The following portions of the chart were reviewed this encounter and updated as appropriate:  Tobacco  Allergies  Meds  Problems  Med Hx  Surg Hx  Fam Hx      Review of Systems: No other skin or systemic complaints except as noted in HPI or Assessment and Plan.   Objective  Well appearing patient in no apparent distress; mood and affect are within normal limits.  A focused examination was performed including face, arms, legs. Relevant physical exam findings are noted in the Assessment and Plan.  Left Forearm - Anterior Clear today on clinical exam   Assessment & Plan  Chronic idiopathic urticaria Left Forearm - Anterior  Chronic condition with duration or expected duration over one year. Currently well-controlled.   Xolair 150mg  injected into both right and left upper arms.  Patient tolerated procedure well.   Lot: Exp: 10/2022  NDC: 11/2022   BP before injection: 102/68 BP 15 minutes after injection: 102/69 BP 30 minutes after injection: 94/66   Continue Xolair 300 mg injections every 4 weeks as directed. Reviewed risk of allergic reaction.  Check expiration dates on Epi-pens. Call if need new Rx sent in.   Related Medications omalizumab [id number]) prefilled syringe 300 mg   Scar Left Lower Leg - Anterior  Favor scar with ecchymosis  Call of purple color not resolved in 1-2 months. Will recheck at follow-up.   Actinic Damage with PreCancerous Actinic Keratoses Counseling for Topical Chemotherapy Management: Patient exhibits: - Severe, confluent actinic changes with  pre-cancerous actinic keratoses that is secondary to cumulative UV radiation exposure over time - Condition that is severe; chronic, not at goal. - diffuse scaly erythematous macules and papules with underlying dyspigmentation - Discussed Prescription "Field Treatment" topical Chemotherapy for Severe, Chronic Confluent Actinic Changes with Pre-Cancerous Actinic Keratoses Field treatment involves treatment of an entire area of skin that has confluent Actinic Changes (Sun/ Ultraviolet light damage) and PreCancerous Actinic Keratoses by method of PhotoDynamic Therapy (PDT) and/or prescription Topical Chemotherapy agents such as 5-fluorouracil, 5-fluorouracil/calcipotriene, and/or imiquimod.  The purpose is to decrease the number of clinically evident and subclinical PreCancerous lesions to prevent progression to development of skin cancer by chemically destroying early precancer changes that may or may not be visible.  It has been shown to reduce the risk of developing skin cancer in the treated area. As a result of treatment, redness, scaling, crusting, and open sores may occur during treatment course. One or more than one of these methods may be used and may have to be used several times to control, suppress and eliminate the PreCancerous changes. Discussed treatment course, expected reaction, and possible side effects. - Recommend daily broad spectrum sunscreen SPF 30+ to sun-exposed areas, reapply every 2 hours as needed.  - Staying in the shade or wearing long sleeves, sun glasses (UVA+UVB protection) and wide brim hats (4-inch brim around the entire circumference of the hat) are also recommended. - Call for new or changing lesions.  - Start 5-fluorouracil/calcipotriene cream  twice daily to face, avoiding eyes and lips, for 4 days, apply twice daily to chest for 7 days. Prescription  sent to [facility]. Patient advised they will receive an email to purchase the medication online  and have it sent to their home. Patient provided with handout reviewing treatment course and side effects and advised to call or message [place] on MyChart with any concerns.  Reviewed course of treatment and expected reaction.  Patient advised to expect inflammation and crusting and advised that erosions are possible.  Patient advised to be diligent with sun protection during and after treatment. Counseled to keep medication out of reach of children and pets.   Return in about 4 weeks (around 02/07/2022) for Injection On Nurse Schedule (Xolair), 4 months TBSE and follow up with Dr. Laurence Ferrari.  I, Emelia Salisbury, CMA, am acting as scribe for Forest Gleason, MD.  Documentation: I have reviewed the above documentation for accuracy and completeness, and I agree with the above.  Forest Gleason, MD

## 2022-01-10 NOTE — Patient Instructions (Addendum)
Continue Xolair injections every 4 weeks as directed.    - Start 5-fluorouracil/calcipotriene cream  twice daily to face, avoiding eyes and lips, for 4 days, apply twice daily to chest for 7 days. Prescription sent to [facility]. Patient advised they will receive an email to purchase the medication online and have it sent to their home. Patient provided with handout reviewing treatment course and side effects and advised to call or message [place] on MyChart with any concerns. Treat one section at a time. Start treatment after holidays.  Instructions for [facility]  One or more of your medications was sent to the [facility]. You will receive an email from them and can purchase the medicine through that link. It will then be mailed to your home at the address you confirmed. If for any reason you do not receive an email from them, please check your spam folder. If you still do not find the email, please let us know. [facility] phone number is [phone].    5-Fluorouracil/Calcipotriene Patient Education   Actinic keratoses are the dry, red scaly spots on the skin caused by sun damage. A portion of these spots can turn into skin cancer with time, and treating them can help prevent development of skin cancer.   Treatment of these spots requires removal of the defective skin cells. There are various ways to remove actinic keratoses, including freezing with liquid nitrogen, treatment with creams, or treatment with a blue light procedure in the office.   5-fluorouracil cream is a topical cream used to treat actinic keratoses. It works by interfering with the growth of abnormal fast-growing skin cells, such as actinic keratoses. These cells peel off and are replaced by healthy ones.   5-fluorouracil/calcipotriene is a combination of the 5-fluorouracil cream with a vitamin D analog cream called calcipotriene. The  calcipotriene alone does not treat actinic keratoses. However, when it is combined with 5-fluorouracil, it helps the 5-fluorouracil treat the actinic keratoses much faster so that the same results can be achieved with a much shorter treatment time.  INSTRUCTIONS FOR 5-FLUOROURACIL/CALCIPOTRIENE CREAM:   5-fluorouracil/calcipotriene cream typically only needs to be used for 4-7 days. A thin layer should be applied twice a day to the treatment areas recommended by your physician.   If your physician prescribed you separate tubes of 5-fluourouracil and calcipotriene, apply a thin layer of 5-fluorouracil followed by a thin layer of calcipotriene.   Avoid contact with your eyes, nostrils, and mouth. Do not use 5-fluorouracil/calcipotriene cream on infected or open wounds.   You will develop redness, irritation and some crusting at areas where you have pre-cancer damage/actinic keratoses. IF YOU DEVELOP PAIN, BLEEDING, OR SIGNIFICANT CRUSTING, STOP THE TREATMENT EARLY - you have already gotten a good response and the actinic keratoses should clear up well.  Wash your hands after applying 5-fluorouracil 5% cream on your skin.   A moisturizer or sunscreen with a minimum SPF 30 should be applied each morning.   Once you have finished the treatment, you can apply a thin layer of Vaseline twice a day to irritated areas to soothe and calm the areas more quickly. If you experience significant discomfort, contact your physician.  For some patients it is necessary to repeat the treatment for best results.  SIDE EFFECTS: When using 5-fluorouracil/calcipotriene cream, you may have mild irritation, such as redness, dryness, swelling, or a mild burning sensation. This usually resolves within 2 weeks. The more actinic keratoses you  have, the more redness and inflammation you can expect during treatment. Eye irritation has been reported rarely. If this occurs, please let us know.  If you have any trouble using this  cream, please call the office. If you have any other questions about this information, please do not hesitate to ask me before you leave the office.     Recommend daily broad spectrum sunscreen SPF 30+ to sun-exposed areas, reapply every 2 hours as needed. Call for new or changing lesions.  Staying in the shade or wearing long sleeves, sun glasses (UVA+UVB protection) and wide brim hats (4-inch brim around the entire circumference of the hat) are also recommended for sun protection.    Due to recent changes in healthcare laws, you may see results of your pathology and/or laboratory studies on MyChart before the doctors have had a chance to review them. We understand that in some cases there may be results that are confusing or concerning to you. Please understand that not all results are received at the same time and often the doctors may need to interpret multiple results in order to provide you with the best plan of care or course of treatment. Therefore, we ask that you please give [place] 2 business days to thoroughly review all your results before contacting the office for clarification. Should we see a critical lab result, you will be contacted sooner.   If You Need Anything After Your Visit  If you have any questions or concerns for your doctor, please call our main line at [phone] and press option 4 to reach your doctor's medical assistant. If no one answers, please leave a voicemail as directed and we will return your call as soon as possible. Messages left after 4 pm will be answered the following business day.   You may also send [place] a message via [place]. We typically respond to MyChart messages within 1-2 business days.  For prescription refills, please ask your pharmacy to contact our office. Our fax number is [phone].  If you have an urgent issue when the clinic is closed that cannot wait until the next business day, you can page your doctor at the number below.    Please  note that while we do our best to be available for urgent issues outside of office hours, we are not available 24/7.   If you have an urgent issue and are unable to reach [place], you may choose to seek medical care at your doctor's office, retail clinic, urgent care center, or emergency room.  If you have a medical emergency, please immediately call 911 or go to the emergency department.  Pager Numbers  - Dr. Nehemiah Massed: [phone]  - Dr. Laurence Ferrari: [phone]  - Dr. Nicole Kindred: [phone]  In the event of inclement weather, please call our main line at [phone] for an update on the status of any delays or closures.  Dermatology Medication Tips: Please keep the boxes that topical medications come in in order to help keep track of the instructions about where and how to use these. Pharmacies typically print the medication instructions only on the boxes and not directly on the medication tubes.   If your medication is too expensive, please contact our office at [phone] option 4 or send [place] a message through [place].   We are unable to tell what your co-pay for medications will be in advance as this is different depending on your insurance coverage. However, we may be able to find a substitute medication at lower  cost or fill out paperwork to get insurance to cover a needed medication.   If a prior authorization is required to get your medication covered by your insurance company, please allow [place] 1-2 business days to complete this process.  Drug prices often vary depending on where the prescription is filled and some pharmacies may offer cheaper prices.  The website www.goodrx.com contains coupons for medications through different pharmacies. The prices here do not account for what the cost may be with help from insurance (it may be cheaper with your insurance), but the website can give you the price if you did not use any insurance.  - You can print the associated coupon and take it with  your prescription to the pharmacy.  - You may also stop by our office during regular business hours and pick up a GoodRx coupon card.  - If you need your prescription sent electronically to a different pharmacy, notify our office through [facility] or by phone at [phone] option 4.     Si Usted Necesita Algo Despus de Su Visita  Tambin puede enviarnos un mensaje a travs de Clinical cytogeneticist. Por lo general respondemos a los mensajes de MyChart en el transcurso de 1 a 2 das hbiles.  Para renovar recetas, por favor pida a su farmacia que se ponga en contacto con nuestra oficina. Annie Sable de fax es [place] [phone].  Si tiene un asunto urgente cuando la clnica est cerrada y que no puede esperar hasta el siguiente da hbil, puede llamar/localizar a su doctor(a) al nmero que aparece a continuacin.   Por favor, tenga en cuenta que aunque hacemos todo lo posible para estar disponibles para asuntos urgentes fuera del horario de [place], no estamos disponibles las 24 horas del da, los 7 [address]  [address] de la [place].   Si tiene un problema urgente y no puede comunicarse con nosotros, puede optar por buscar atencin mdica  en el consultorio de su doctor(a), en una clnica privada, en un centro de atencin urgente o en una sala de emergencias.  Si tiene Engineer, drilling, por favor llame inmediatamente al 911 o vaya a la sala de emergencias.  Nmeros de bper  - Dr. Gwen Pounds: [phone]  - Dra. Moye: [phone]  - Dra. Roseanne Reno: [phone]  En caso de inclemencias del [place], por favor llame a Lacy Duverney principal al [phone] para una actualizacin sobre el [place] de cualquier retraso o cierre.  Consejos para la medicacin en dermatologa: Por favor, guarde las cajas en las que vienen los medicamentos de uso tpico para ayudarle a seguir las instrucciones sobre dnde y cmo usarlos. Las farmacias generalmente imprimen las instrucciones del medicamento slo en las cajas y  no directamente en los tubos del [place].   Si su medicamento es muy caro, por favor, pngase en contacto con Rolm Gala llamando al [phone] y presione la opcin 4 o envenos un mensaje a travs de Clinical cytogeneticist.   No podemos decirle cul ser su copago por los medicamentos por adelantado ya que esto es diferente dependiendo de la cobertura de su seguro. Sin embargo, es posible que podamos encontrar un medicamento sustituto a Audiological scientist un formulario para que el seguro cubra el medicamento que se considera necesario.   Si se requiere una autorizacin previa para que su compaa de seguros [place] su medicamento, por favor permtanos de 1 a 2 das hbiles para completar [address]  Los precios de los medicamentos varan con frecuencia dependiendo del Environmental consultant de dnde se surte  la receta y alguna farmacias pueden ofrecer precios ms baratos.  El sitio web www.goodrx.com tiene cupones para medicamentos de Airline pilot. Los precios aqu no tienen en cuenta lo que podra costar con la ayuda del seguro (puede ser ms barato con su seguro), pero el sitio web puede darle el precio si no utiliz Research scientist (physical sciences).  - Puede imprimir el cupn correspondiente y llevarlo con su receta a la farmacia.  - Tambin puede pasar por nuestra oficina durante el horario de atencin regular y Charity fundraiser una tarjeta de cupones de GoodRx.  - Si necesita que su receta se enve electrnicamente a una farmacia diferente, informe a nuestra oficina a travs de MyChart de [place] o por telfono llamando al [phone] y presione la opcin 4.

## 2022-01-17 ENCOUNTER — Encounter: Payer: Self-pay | Admitting: Dermatology

## 2022-01-17 ENCOUNTER — Telehealth: Payer: Self-pay

## 2022-01-17 DIAGNOSIS — L509 Urticaria, unspecified: Secondary | ICD-10-CM

## 2022-01-17 MED ORDER — OMALIZUMAB 150 MG/ML ~~LOC~~ SOSY: PREFILLED_SYRINGE | SUBCUTANEOUS | 3 refills | Status: DC

## 2022-01-17 NOTE — Telephone Encounter (Signed)
Fax received from [facility] for prescription refill request for Xolair. Request responded to electronically.

## 2022-02-08 ENCOUNTER — Encounter: Payer: Self-pay | Admitting: Dermatology

## 2022-02-08 ENCOUNTER — Ambulatory Visit (INDEPENDENT_AMBULATORY_CARE_PROVIDER_SITE_OTHER): Payer: Commercial Managed Care - HMO | Admitting: Dermatology

## 2022-02-08 DIAGNOSIS — L578 Other skin changes due to chronic exposure to nonionizing radiation: Secondary | ICD-10-CM

## 2022-02-08 DIAGNOSIS — R21 Rash and other nonspecific skin eruption: Secondary | ICD-10-CM

## 2022-02-08 DIAGNOSIS — L509 Urticaria, unspecified: Secondary | ICD-10-CM

## 2022-02-08 MED ORDER — TRIAMCINOLONE ACETONIDE 0.025 % EX OINT: TOPICAL_OINTMENT | CUTANEOUS | 1 refills | Status: DC

## 2022-02-08 MED ORDER — OMALIZUMAB 150 MG/ML ~~LOC~~ SOSY
150.0000 mg | PREFILLED_SYRINGE | Freq: Once | SUBCUTANEOUS | Status: AC
  Administered 2022-02-08: 150 mg via SUBCUTANEOUS

## 2022-02-08 NOTE — Patient Instructions (Addendum)
For Cool Down after 5-fluorouracil/calcipotriene cream treatment:  For Chest -  - Start Sernivo spray once a day - Start triamcinolone 0.025% ointment once a day.  - When you run out of the [employer], switch to triamcinolone 0.025% ointment twice a day instead of once a day. You can continue this for up to 2 weeks.  For Face - - After you finish the 4th day of treatment with the 5-fluorouracil/calcipotriene cream, you can use the triamcinolone 0.025% ointment twice a day for up to 7 days to calm down the irritation in the area.   Topical steroids (such as Sernivo, triamcinolone, fluocinolone, fluocinonide, mometasone, clobetasol, halobetasol, betamethasone, hydrocortisone) can cause thinning and lightening of the skin if they are used for too long in the same area. Your physician has selected the right strength medicine for your problem and area affected on the body. Please use your medication only as directed by your physician to prevent side effects.    5-Fluorouracil/Calcipotriene Patient Education   Actinic keratoses are the dry, red scaly spots on the skin caused by sun damage. A portion of these spots can turn into skin cancer with time, and treating them can help prevent development of skin cancer.   Treatment of these spots requires removal of the defective skin cells. There are various ways to remove actinic keratoses, including freezing with liquid nitrogen, treatment with creams, or treatment with a blue light procedure in the office.   5-fluorouracil cream is a topical cream used to treat actinic keratoses. It works by interfering with the growth of abnormal fast-growing skin cells, such as actinic keratoses. These cells peel off and are replaced by healthy ones. THIS CREAM SHOULD BE KEPT OUT OF REACH OF CHILDREN AND PETS AND SHOULD NOT BE USED BY PREGNANT WOMEN.  5-fluorouracil/calcipotriene is a combination of the 5-fluorouracil cream with a vitamin D analog cream called  calcipotriene. The calcipotriene alone does not treat actinic keratoses. However, when it is combined with 5-fluorouracil, it helps the 5-fluorouracil treat the actinic keratoses much faster so that the same results can be achieved with a much shorter treatment time.  INSTRUCTIONS FOR 5-FLUOROURACIL/CALCIPOTRIENE CREAM:   5-fluorouracil/calcipotriene cream typically only needs to be used for 4-7 days. A thin layer should be applied twice a day to the treatment areas recommended by your physician.   If your physician prescribed you separate tubes of 5-fluourouracil and calcipotriene, apply a thin layer of 5-fluorouracil followed by a thin layer of calcipotriene.   Avoid contact with your eyes or nostrils. Avoid applying the cream to your eyelids or lips unless directed to apply there by your physician. Do not use 5-fluorouracil/calcipotriene cream on infected or open wounds.   You will develop redness, irritation and some crusting at areas where you have pre-cancer damage/actinic keratoses. IF YOU DEVELOP PAIN, BLEEDING, OR SIGNIFICANT CRUSTING, STOP THE TREATMENT EARLY - you have already gotten a good response and the actinic keratoses should clear up well.  Wash your hands after applying 5-fluorouracil 5% cream on your skin.   A moisturizer or sunscreen with a minimum SPF 30 should be applied each morning.   Once you have finished the treatment, you can apply a thin layer of Vaseline twice a day to irritated areas to soothe and calm the areas more quickly. If you experience significant discomfort, contact your physician.  For some patients it is necessary to repeat the treatment for best results.  SIDE EFFECTS: When using 5-fluorouracil/calcipotriene cream, you may have mild irritation, such  as redness, dryness, swelling, or a mild burning sensation. This usually resolves within 2 weeks. The more actinic keratoses you have, the more redness and inflammation you can expect during treatment. Eye  irritation has been reported rarely. If this occurs, please let us know.   If you have any trouble using this cream, please send [place] a MyChart message or call the office. If you have any other questions about this information, please do not hesitate to ask me before you leave the office or contact me on MyChart or by phone.  Due to recent changes in healthcare laws, you may see results of your pathology and/or laboratory studies on MyChart before the doctors have had a chance to review them. We understand that in some cases there may be results that are confusing or concerning to you. Please understand that not all results are received at the same time and often the doctors may need to interpret multiple results in order to provide you with the best plan of care or course of treatment. Therefore, we ask that you please give [place] 2 business days to thoroughly review all your results before contacting the office for clarification. Should we see a critical lab result, you will be contacted sooner.   If You Need Anything After Your Visit  If you have any questions or concerns for your doctor, please call our main line at [phone] and press option 4 to reach your doctor's medical assistant. If no one answers, please leave a voicemail as directed and we will return your call as soon as possible. Messages left after 4 pm will be answered the following business day.   You may also send [place] a message via MyChart. We typically respond to MyChart messages within 1-2 business days.  For prescription refills, please ask your pharmacy to contact our office. Our fax number is [phone].  If you have an urgent issue when the clinic is closed that cannot wait until the next business day, you can page your doctor at the number below.    Please note that while we do our best to be available for urgent issues outside of office hours, we are not available 24/7.   If you have an urgent issue and are unable to reach  [place], you may choose to seek medical care at your doctor's office, retail clinic, urgent care center, or emergency room.  If you have a medical emergency, please immediately call 911 or go to the emergency department.  Pager Numbers  - Dr. Gwen Pounds: [phone]  - Dr. Neale Burly: [phone]  - Dr. Roseanne Reno: [phone]  In the event of inclement weather, please call our main line at [phone] for an update on the status of any delays or closures.  Dermatology Medication Tips: Please keep the boxes that topical medications come in in order to help keep track of the instructions about where and how to use these. Pharmacies typically print the medication instructions only on the boxes and not directly on the medication tubes.   If your medication is too expensive, please contact our office at [phone] option 4 or send [place] a message through MyChart.   We are unable to tell what your co-pay for medications will be in advance as this is different depending on your insurance coverage. However, we may be able to find a substitute medication at lower cost or fill out paperwork to get insurance to cover a needed medication.   If a prior authorization is required to get your medication  covered by your insurance company, please allow [place] 1-2 business days to complete this process.  Drug prices often vary depending on where the prescription is filled and some pharmacies may offer cheaper prices.  The website www.goodrx.com contains coupons for medications through different pharmacies. The prices here do not account for what the cost may be with help from insurance (it may be cheaper with your insurance), but the website can give you the price if you did not use any insurance.  - You can print the associated coupon and take it with your prescription to the pharmacy.  - You may also stop by our office during regular business hours and pick up a GoodRx coupon card.  - If you need your prescription sent  electronically to a different pharmacy, notify our office through [facility] or by phone at [phone] option 4.     Si Usted Necesita Algo Despus de Su Visita  Tambin puede enviarnos un mensaje a travs de Pharmacist, community. Por lo general respondemos a los mensajes de MyChart en el transcurso de 1 a 2 das hbiles.  Para renovar recetas, por favor pida a su farmacia que se ponga en contacto con nuestra oficina. Harland Dingwall de fax es [place] [phone].  Si tiene un asunto urgente cuando la clnica est cerrada y que no puede esperar hasta el siguiente da hbil, puede llamar/localizar a su doctor(a) al nmero que aparece a continuacin.   Por favor, tenga en cuenta que aunque hacemos todo lo posible para estar disponibles para asuntos urgentes fuera del horario de [place], no estamos disponibles las 24 horas del da, los 7 das de la [place].   Si tiene un problema urgente y no puede comunicarse con nosotros, puede optar por buscar atencin mdica  en el consultorio de su doctor(a), en una clnica privada, en un centro de atencin urgente o en una sala de emergencias.  Si tiene Engineering geologist, por favor llame inmediatamente al 911 o vaya a la sala de emergencias.  Nmeros de bper  - Dr. Nehemiah Massed: [phone]  - Dra. Moye: [phone]  - Dra. Nicole Kindred: [phone]  En caso de inclemencias del [place], por favor llame a Johnsie Kindred principal al [phone] para una actualizacin sobre el [place] de cualquier retraso o cierre.  Consejos para la medicacin en dermatologa: Por favor, guarde las cajas en las que vienen los medicamentos de uso tpico para ayudarle a seguir las instrucciones sobre dnde y cmo usarlos. Las farmacias generalmente imprimen las instrucciones del medicamento slo en las cajas y no directamente en los tubos del [place].   Si su medicamento es muy caro, por favor, pngase en contacto con Zigmund Daniel llamando al [phone] y presione la  opcin 4 o envenos un mensaje a travs de Pharmacist, community.   No podemos decirle cul ser su copago por los medicamentos por adelantado ya que esto es diferente dependiendo de la cobertura de su seguro. Sin embargo, es posible que podamos encontrar un medicamento sustituto a Electrical engineer un formulario para que el seguro cubra el medicamento que se considera necesario.   Si se requiere una autorizacin previa para que su compaa de seguros [place] su medicamento, por favor permtanos de 1 a 2 das hbiles para completar este proceso.  Los precios de los medicamentos varan con frecuencia dependiendo del Environmental consultant de dnde se surte la receta y alguna farmacias pueden ofrecer precios ms baratos.  El sitio web www.goodrx.com tiene cupones para medicamentos de Airline pilot. Los precios aqu  en cuenta lo que podra costar con la ayuda del seguro (puede ser ms barato con su seguro), pero el sitio web puede darle el precio si no utiliz ningn seguro.  - Puede imprimir el cupn correspondiente y llevarlo con su receta a la farmacia.  - Tambin puede pasar por nuestra oficina durante el horario de atencin regular y recoger una tarjeta de cupones de GoodRx.  - Si necesita que su receta se enve electrnicamente a una farmacia diferente, informe a nuestra oficina a travs de MyChart de [place] o por telfono llamando al [phone] y presione la opcin 4.  

## 2022-02-08 NOTE — Progress Notes (Signed)
Follow-Up Visit   Subjective  Jodi Anderson is a 62 y.o. female who presents for the following: Actinic Keratosis (Patient here for some spots at face. Patient recently finished using topical chemo at chest with a significant reaction and persistent itchy rash at chest for 2.5 weeks).  The following portions of the chart were reviewed this encounter and updated as appropriate:   Tobacco  Allergies  Meds  Problems  Med Hx  Surg Hx  Fam Hx      Review of Systems:  No other skin or systemic complaints except as noted in HPI or Assessment and Plan.  Objective  Well appearing patient in no apparent distress; mood and affect are within normal limits.  A focused examination was performed including chest, face. Relevant physical exam findings are noted in the Assessment and Plan.  chest Erythematous scaly pink papules coalescing to plaques  Right Abdomen (side) - Upper Clear     Assessment & Plan  Rash chest  secondary to treating with 5FU/calcipotriene, with persistent reaction 2.5 weeks after treatment  For Cool Down after 5-fluorouracil/calcipotriene cream treatment:  For Chest -  - Start Sernivo spray once a day - Start triamcinolone 0.025% ointment once a day.  - When you run out of the [employer], switch to triamcinolone 0.025% ointment twice a day instead of once a day. You can continue this for up to 2 weeks.  For Face - - After you finish the 4th day of treatment with the 5-fluorouracil/calcipotriene cream, you can use the triamcinolone 0.025% ointment twice a day for up to 7 days to calm down the irritation in the area.   Topical steroids (such as Sernivo, triamcinolone, fluocinolone, fluocinonide, mometasone, clobetasol, halobetasol, betamethasone, hydrocortisone) can cause thinning and lightening of the skin if they are used for too long in the same area. Your physician has selected the right strength medicine for your problem and area affected on the  body. Please use your medication only as directed by your physician to prevent side effects.   triamcinolone (KENALOG) 0.025 % ointment - chest Apply to affected areas 1-2 times daily as needed.  Urticaria Right Abdomen (side) - Upper  Continue Xolair 300 mg injections every 4 weeks as directed. Injected today at upper arms.   BP before injection: 91/65 BP 15 minutes after injection: 96/62 BP 30 minutes after injection: 104/71  Lot: [id number] Exp: 09/2022   BP before injection: 102/68 BP 15 minutes after injection: 102/69 BP 30 minutes after injection: 94/66    Related Medications omalizumab (XOLAIR) prefilled syringe 150 mg   Hives  Related Medications omalizumab (XOLAIR) 150 MG/ML prefilled syringe INJECT 300MG  SUBCUTANEOUSLY  EVERY 4 WEEKS  Actinic skin damage  Actinic Damage with PreCancerous Actinic Keratoses Counseling for Topical Chemotherapy Management: Patient exhibits: - Severe, confluent actinic changes with pre-cancerous actinic keratoses that is secondary to cumulative UV radiation exposure over time - Condition that is severe; chronic, not at goal. - diffuse scaly erythematous macules and papules with underlying dyspigmentation - Discussed Prescription "Field Treatment" topical Chemotherapy for Severe, Chronic Confluent Actinic Changes with Pre-Cancerous Actinic Keratoses Field treatment involves treatment of an entire area of skin that has confluent Actinic Changes (Sun/ Ultraviolet light damage) and PreCancerous Actinic Keratoses by method of PhotoDynamic Therapy (PDT) and/or prescription Topical Chemotherapy agents such as 5-fluorouracil, 5-fluorouracil/calcipotriene, and/or imiquimod.  The purpose is to decrease the number of clinically evident and subclinical PreCancerous lesions to prevent progression to development of skin cancer by chemically destroying  early precancer changes that may or may not be visible.  It has been shown to reduce the risk of  developing skin cancer in the treated area. As a result of treatment, redness, scaling, crusting, and open sores may occur during treatment course. One or more than one of these methods may be used and may have to be used several times to control, suppress and eliminate the PreCancerous changes. Discussed treatment course, expected reaction, and possible side effects. - Recommend daily broad spectrum sunscreen SPF 30+ to sun-exposed areas, reapply every 2 hours as needed.  - Staying in the shade or wearing long sleeves, sun glasses (UVA+UVB protection) and wide brim hats (4-inch brim around the entire circumference of the hat) are also recommended. - Call for new or changing lesions. - Start 5-fluorouracil/calcipotriene cream twice a day for 4 days to affected areas including face. Prescription sent to [facility]. Patient advised they will receive an email to purchase the medication online and have it sent to their home. Patient provided with handout reviewing treatment course and side effects and advised to call or message [place] on MyChart with any concerns.  Reviewed course of treatment and expected reaction.  Patient advised to expect inflammation and crusting and advised that erosions are possible.  Patient advised to be diligent with sun protection during and after treatment. Counseled to keep medication out of reach of children and pets.  Return in about 4 weeks (around 03/08/2022) for Xolair.  Graciella Belton, RMA, am acting as scribe for Forest Gleason, MD .  Documentation: I have reviewed the above documentation for accuracy and completeness, and I agree with the above.  Forest Gleason, MD

## 2022-02-12 HISTORY — PX: OTHER SURGICAL HISTORY: SHX169

## 2022-02-15 ENCOUNTER — Ambulatory Visit: Payer: Commercial Managed Care - HMO

## 2022-03-02 ENCOUNTER — Telehealth: Payer: Self-pay | Admitting: Family Medicine

## 2022-03-02 NOTE — Telephone Encounter (Signed)
Two weeks ago pt had a leg nerve decompression procedure, states it can take 9 months to help her pain as the nerve grows back 1 inch per month.  Pt R foot is really bothering her, can she get a steroid injection in this foot or should she NOT do this based on her recent procedure.

## 2022-03-05 NOTE — Telephone Encounter (Signed)
Sent patient MyChart message.

## 2022-03-06 NOTE — Telephone Encounter (Signed)
Added patient to wait list. Will call as cancellations come up.

## 2022-03-08 ENCOUNTER — Ambulatory Visit (INDEPENDENT_AMBULATORY_CARE_PROVIDER_SITE_OTHER): Payer: Commercial Managed Care - HMO

## 2022-03-08 ENCOUNTER — Ambulatory Visit: Payer: Commercial Managed Care - HMO

## 2022-03-08 ENCOUNTER — Ambulatory Visit: Payer: Self-pay

## 2022-03-08 ENCOUNTER — Ambulatory Visit: Payer: Commercial Managed Care - HMO | Admitting: Family Medicine

## 2022-03-08 VITALS — BP 110/80 | HR 63 | Ht 62.0 in | Wt 129.0 lb

## 2022-03-08 DIAGNOSIS — L509 Urticaria, unspecified: Secondary | ICD-10-CM | POA: Diagnosis not present

## 2022-03-08 DIAGNOSIS — M79671 Pain in right foot: Secondary | ICD-10-CM

## 2022-03-08 DIAGNOSIS — M25472 Effusion, left ankle: Secondary | ICD-10-CM | POA: Diagnosis not present

## 2022-03-08 DIAGNOSIS — M19079 Primary osteoarthritis, unspecified ankle and foot: Secondary | ICD-10-CM | POA: Diagnosis not present

## 2022-03-08 MED ORDER — OMALIZUMAB 150 MG/ML ~~LOC~~ SOSY
150.0000 mg | PREFILLED_SYRINGE | Freq: Once | SUBCUTANEOUS | Status: AC
  Administered 2022-03-08: 150 mg via SUBCUTANEOUS

## 2022-03-08 NOTE — Progress Notes (Signed)
Jodi Anderson [address] [place] Phone: [phone] Subjective:   Jodi Anderson, am serving as a scribe for Dr. Hulan Saas.  I'm seeing this patient by the request  of:  Jodi Hawthorne, FNP  CC: Right foot pain  [id number]  Jodi Anderson is a 62 y.o. female coming in with complaint of right foot pain.  Patient did have a left peroneal nerve decompression at the fibular neck.  Patient had the surgery on January 22. Pain on top of foot running to big toe. Feels like shooting nerve pain sometimes. Last injection didn't help, but optimistic about one today.     Past Medical History:  Diagnosis Date   Cardiac murmur    Palpitations    Past Surgical History:  Procedure Laterality Date   CESAREAN SECTION     3x   COLONOSCOPY  2016   PINNING FOR LEFT TOE FRACTURE     tummy tuck     Social History   Socioeconomic History   Marital status: Married    Spouse name: Not on file   Number of children: Not on file   Years of education: Not on file   Highest education level: Not on file  Occupational History   Not on file  Tobacco Use   Smoking status: Never   Smokeless tobacco: Never  Vaping Use   Vaping Use: Never used  Substance and Sexual Activity   Alcohol use: Yes   Drug use: No   Sexual activity: Not on file  Other Topics Concern   Not on file  Social History Narrative   Lives in [place]. 4 children    1 son in program at [facility] has autism, 3 years old, lives in home in [facility]. Has dog and 3 cats.      Work - homemaker      Diet - regular diet      Exercise - walks daily, weights, trampoline   Social Determinants of Health   Financial Resource Strain: Not on file  Food Insecurity: Not on file  Transportation Needs: Not on file  Physical Activity: Not on file  Stress: Not on file  Social Connections: Not on file   Allergies  Allergen Reactions   Codeine Nausea Only   Family History  Problem  Relation Age of Onset   Epilepsy Son    Cancer Paternal Grandmother        breast   Cancer Maternal Aunt        lung      Current Outpatient Medications (Respiratory):    omalizumab (XOLAIR) 150 MG/ML prefilled syringe, INJECT 300MG SUBCUTANEOUSLY  EVERY 4 WEEKS  Current Outpatient Medications (Analgesics):    diclofenac (VOLTAREN) 75 MG EC tablet, Take 1 tablet (75 mg total) by mouth 2 (two) times daily.   Current Outpatient Medications (Other):    bimatoprost (LATISSE) 0.03 % ophthalmic solution, Place 1 application  into both eyes at bedtime. Place one drop on applicator and apply evenly along the skin of the upper eyelid at base of eyelashes once daily at bedtime; repeat procedure for second eye (use a clean applicator).   gabapentin (NEURONTIN) 100 MG capsule, Take 2 capsules (200 mg total) by mouth 3 (three) times daily.   metaxalone (SKELAXIN) 800 MG tablet, Take 1 tablet (800 mg total) by mouth 2 (two) times daily as needed for muscle spasms.   triamcinolone (KENALOG) 0.025 % ointment, Apply to affected areas 1-2 times daily  as needed.   triamcinolone cream (KENALOG) 0.1 %, APPLY TO AFFECTED AREAS WITH ECZEMA TWICE DAILY AS NEEDED FOR UP TO 14 DAYS. AVOID FACE, GROIN AND AXILLA.  RISK OF SKIN ATROPHY WITH LONG USE REVIEWED.   zolpidem (AMBIEN) 10 MG tablet, Take 1 tablet (10 mg total) by mouth at bedtime as needed. for sleep   Reviewed prior external information including notes and imaging from  primary care provider As well as notes that were available from care everywhere and other healthcare systems.  Past medical history, social, surgical and family history all reviewed in electronic medical record.  No pertanent information unless stated regarding to the chief complaint.   Review of Systems:  No headache, visual changes, nausea, vomiting, diarrhea, constipation, dizziness, abdominal pain, skin rash, fevers, chills, night sweats, weight loss, swollen lymph nodes, body  aches, joint swelling, chest pain, shortness of breath, mood changes. POSITIVE muscle aches  Objective  Blood pressure 110/80, pulse 63, height 5' 2"$  (1.575 m), weight 129 lb (58.5 kg), last menstrual period 06/23/2014, SpO2 96 %.   General: No apparent distress alert and oriented x3 mood and affect normal, dressed appropriately.  HEENT: Pupils equal, extraocular movements intact  Respiratory: Patient's speak in full sentences and does not appear short of breath  Cardiovascular: No lower extremity edema, non tender, no erythema  Foot exam shows mild swelling over the midfoot still noted.  Does have some limitation in extension or dorsiflexion of the foot noted.  Limited muscular skeletal ultrasound was performed and interpreted by Hulan Saas, M  On ultrasound patient does have hypoechoic changes that seems to be in the midfoot but more of the lateral aspect between the fourth and fifth metatarsals this time with hypoechoic changes noted.  Moderate narrowing of the entire midfoot though noted.  Patient does have bile mildly hypoechoic changes noted of the anterior mortise of the ankle also noted. Impression: Exacerbation of the midfoot arthritis with small effusion of the ankle  Procedure: Real-time Ultrasound Guided Injection of midfoot Device: GE Logiq Q7 Ultrasound guided injection is preferred based studies that show increased duration, increased effect, greater accuracy, decreased procedural pain, increased response rate, and decreased cost with ultrasound guided versus blind injection.  Verbal informed consent obtained.  Time-out conducted.  Noted no overlying erythema, induration, or other signs of local infection.  Skin prepped in a sterile fashion.  Local anesthesia: Topical Ethyl chloride.  With sterile technique and under real time ultrasound guidance: With a 25-gauge half inch needle injected with 0.5 cc of 0.5% Marcaine and 0.5 cc of Kenalog 40 mg/mL Completed without  difficulty  Pain immediately resolved suggesting accurate placement of the medication.  Advised to call if fevers/chills, erythema, induration, drainage, or persistent bleeding.  Impression: Technically successful ultrasound guided injection.  Procedure: Real-time Ultrasound Guided Injection of left ankle Device: GE Logiq Q7 Ultrasound guided injection is preferred based studies that show increased duration, increased effect, greater accuracy, decreased procedural pain, increased response rate, and decreased cost with ultrasound guided versus blind injection.  Verbal informed consent obtained.  Time-out conducted.  Noted no overlying erythema, induration, or other signs of local infection.  Skin prepped in a sterile fashion.  Local anesthesia: Topical Ethyl chloride.  With sterile technique and under real time ultrasound guidance: With a 25-gauge half inch needle injected with 0.5 cc of 0.5% Marcaine and 0.5 cc of Kenalog 40 mg/mL Completed without difficulty  Pain immediately resolved suggesting accurate placement of the medication.  Advised to call if  fevers/chills, erythema, induration, drainage, or persistent bleeding.  Impression: Technically successful ultrasound guided injection.    Impression and Recommendations:     The above documentation has been reviewed and is accurate and complete Lyndal Pulley, DO

## 2022-03-08 NOTE — Patient Instructions (Addendum)
Injection in foot today See you again in 2 months

## 2022-03-08 NOTE — Progress Notes (Signed)
Patient here for restart of her Xolair injection. Xolair 15m injected into each arm, left and right. Patient tolerated well.  3 month followup scheduled with Dr. MLaurence Ferrari   LOT:  [id number]: 01/2023   Blood Pressure before injections: 99/76 15 minutes after: 98/70 30 minutes after: 9[place] RMA

## 2022-03-09 ENCOUNTER — Encounter: Payer: Self-pay | Admitting: Family Medicine

## 2022-03-09 DIAGNOSIS — M25472 Effusion, left ankle: Secondary | ICD-10-CM | POA: Insufficient documentation

## 2022-03-09 NOTE — Assessment & Plan Note (Signed)
Trace amount of swelling and patient was able to move ankle better almost immediately after the injection.  Patient was able to stand on one leg without any difficulty.  No weakness noted.  Hopeful that this will make a difference as well.  Follow-up with me again in 6 to 8 weeks

## 2022-03-09 NOTE — Assessment & Plan Note (Signed)
Injection given and patient tolerated the procedure well, discussed icing regimen and home exercises, increase activity slowly.  Follow-up again in 6 to 8 weeks

## 2022-03-13 ENCOUNTER — Other Ambulatory Visit: Payer: Self-pay | Admitting: Family Medicine

## 2022-03-26 ENCOUNTER — Telehealth: Payer: Self-pay

## 2022-03-26 NOTE — Telephone Encounter (Signed)
Spoke with [facility], patient's Xolair will be delivered 03/28/2022. Lurlean Horns., RMA

## 2022-04-03 ENCOUNTER — Telehealth: Payer: Self-pay

## 2022-04-03 ENCOUNTER — Ambulatory Visit (INDEPENDENT_AMBULATORY_CARE_PROVIDER_SITE_OTHER): Payer: Commercial Managed Care - HMO

## 2022-04-03 DIAGNOSIS — L509 Urticaria, unspecified: Secondary | ICD-10-CM

## 2022-04-03 MED ORDER — OMALIZUMAB 150 MG/ML ~~LOC~~ SOSY
300.0000 mg | PREFILLED_SYRINGE | Freq: Once | SUBCUTANEOUS | Status: AC
  Administered 2022-04-03: 300 mg via SUBCUTANEOUS

## 2022-04-03 NOTE — Telephone Encounter (Signed)
Patient here today for xolair injection. She has rescheduled her TBSE (originally in April) to the fall due to traveling for the spring and summer. Patient agrees to follow up with you for Xolair/Urticaria if she needs to be seen sooner or would you like for her to continue nurse visits at this time?

## 2022-04-03 NOTE — Progress Notes (Signed)
Patient here for her 4 week injection of Xolair for Urticaria.    Xolair '150mg'$  injected into both right and left upper arms.Patient tolerated procedure well.    BP before injection: 114/69 BP 15 minutes after injection: 98/68 BP 30 minutes after injection: 97/69   LOT: [id number] EXP: 02/2023 [id number]   Johnsie Kindred, RMA

## 2022-04-04 NOTE — Telephone Encounter (Signed)
As long as she is doing well without concerns, and keeps the EpiPens available, fine to continue nurse visits til fall. Thank you!

## 2022-04-05 NOTE — Telephone Encounter (Signed)
Patient advised. aw 

## 2022-04-24 ENCOUNTER — Ambulatory Visit (INDEPENDENT_AMBULATORY_CARE_PROVIDER_SITE_OTHER): Payer: Commercial Managed Care - HMO | Admitting: Family Medicine

## 2022-04-24 ENCOUNTER — Encounter: Payer: Self-pay | Admitting: Family Medicine

## 2022-04-24 ENCOUNTER — Other Ambulatory Visit: Payer: Self-pay

## 2022-04-24 VITALS — BP 106/62 | HR 83 | Ht 62.0 in | Wt 124.0 lb

## 2022-04-24 DIAGNOSIS — M79672 Pain in left foot: Secondary | ICD-10-CM | POA: Diagnosis not present

## 2022-04-24 DIAGNOSIS — M25572 Pain in left ankle and joints of left foot: Secondary | ICD-10-CM

## 2022-04-24 NOTE — Progress Notes (Signed)
Zach Del[place] [address] [place] Phone: [phone] Subjective:   [name], am serving as a scribe for Dr. Hulan Saas.  I'm seeing this patient by the request  of:  Burnard Hawthorne, FNP  CC: Left foot pain  [id number]  03/08/2022 Trace amount of swelling and patient was able to move ankle better almost immediately after the injection. Patient was able to stand on one leg without any difficulty. No weakness noted. Hopeful that this will make a difference as well. Follow-up with me again in 6 to 8 weeks   Injection given and patient tolerated the procedure well, discussed icing regimen and home exercises, increase activity slowly.  Follow-up again in 6 to 8 weeks     Jodi Anderson is a 62 y.o. female coming in with complaint of L foot pain. Last injection helped tremendously. Still just a little discomfort in L midfoot. Back is doing much better.      Past Medical History:  Diagnosis Date   Cardiac murmur    Palpitations    Past Surgical History:  Procedure Laterality Date   CESAREAN SECTION     3x   COLONOSCOPY  2016   PINNING FOR LEFT TOE FRACTURE     tummy tuck     Social History   Socioeconomic History   Marital status: Married    Spouse name: Not on file   Number of children: Not on file   Years of education: Not on file   Highest education level: Not on file  Occupational History   Not on file  Tobacco Use   Smoking status: Never   Smokeless tobacco: Never  Vaping Use   Vaping Use: Never used  Substance and Sexual Activity   Alcohol use: Yes   Drug use: No   Sexual activity: Not on file  Other Topics Concern   Not on file  Social History Narrative   Lives in [place]. 4 children    1 son in program at [facility] has autism, 60 years old, lives in [facility] in [facility]. Has dog and 3 cats.      Work - homemaker      Diet - regular diet      Exercise - walks daily, weights, trampoline   Social  Determinants of Health   Financial Resource Strain: Not on file  Food Insecurity: Not on file  Transportation Needs: Not on file  Physical Activity: Not on file  Stress: Not on file  Social Connections: Not on file   Allergies  Allergen Reactions   Codeine Nausea Only   Family History  Problem Relation Age of Onset   Epilepsy Son    Cancer Paternal Grandmother        breast   Cancer Maternal Aunt        lung      Current Outpatient Medications (Respiratory):    omalizumab Arvid Right) 150 MG/ML prefilled syringe, INJECT 300MG  SUBCUTANEOUSLY  EVERY 4 WEEKS  Current Outpatient Medications (Analgesics):    diclofenac (VOLTAREN) 75 MG EC tablet, Take 1 tablet (75 mg total) by mouth 2 (two) times daily.   Current Outpatient Medications (Other):    bimatoprost (LATISSE) 0.03 % ophthalmic solution, Place 1 application  into both eyes at bedtime. Place one drop on applicator and apply evenly along the skin of the upper eyelid at base of eyelashes once daily at bedtime; repeat procedure for second eye (use a clean applicator).   gabapentin (  NEURONTIN) 100 MG capsule, TAKE 2 CAPSULES BY MOUTH 3 TIMES DAILY   metaxalone (SKELAXIN) 800 MG tablet, Take 1 tablet (800 mg total) by mouth 2 (two) times daily as needed for muscle spasms.   triamcinolone (KENALOG) 0.025 % ointment, Apply to affected areas 1-2 times daily as needed.   triamcinolone cream (KENALOG) 0.1 %, APPLY TO AFFECTED AREAS WITH ECZEMA TWICE DAILY AS NEEDED FOR UP TO 14 DAYS. AVOID FACE, GROIN AND AXILLA.  RISK OF SKIN ATROPHY WITH LONG USE REVIEWED.   zolpidem (AMBIEN) 10 MG tablet, Take 1 tablet (10 mg total) by mouth at bedtime as needed. for sleep   Reviewed prior external information including notes and imaging from  primary care provider As well as notes that were available from care everywhere and other healthcare systems.  Past medical history, social, surgical and family history all reviewed in electronic medical  record.  No pertanent information unless stated regarding to the chief complaint.   Review of Systems:  No headache, visual changes, nausea, vomiting, diarrhea, constipation, dizziness, abdominal pain, skin rash, fevers, chills, night sweats, weight loss, swollen lymph nodes, body aches, joint swelling, chest pain, shortness of breath, mood changes. POSITIVE muscle aches  Objective  Last menstrual period 06/23/2014.   General: No apparent distress alert and oriented x3 mood and affect normal, dressed appropriately.  HEENT: Pupils equal, extraocular movements intact  Respiratory: Patient's speak in full sentences and does not appear short of breath  Cardiovascular: No lower extremity edema, non tender, no erythema  Left ankle does have some mild tenderness to palpation still noted in the area.  Seems to be more over the sinus tarsi than anywhere else.  Ankle has significant decrease in the swelling.  Left foot has very minimal discomfort on exam today.  Limited muscular skeletal ultrasound was performed and interpreted by Hulan Saas, M  Limited ultrasound shows the patient's midfoot has significant decrease in hypoechoic changes but still has the arthritic changes noted.  Procedure: Real-time Ultrasound Guided Injection of left sinus tarsi Device: GE Logiq Q7 Ultrasound guided injection is preferred based studies that show increased duration, increased effect, greater accuracy, decreased procedural pain, increased response rate, and decreased cost with ultrasound guided versus blind injection.  Verbal informed consent obtained.  Time-out conducted.  Noted no overlying erythema, induration, or other signs of local infection.  Skin prepped in a sterile fashion.  Local anesthesia: Topical Ethyl chloride.  With sterile technique and under real time ultrasound guidance: Given injection with a 25-gauge half inch needle with 0.5 cc of 0.5% Marcaine and 0.5 cc of Kenalog 40 mg per Completed  without difficulty  Pain immediately resolved suggesting accurate placement of the medication.  Advised to call if fevers/chills, erythema, induration, drainage, or persistent bleeding.  Impression: Technically successful ultrasound guided injection.    Impression and Recommendations:    The above documentation has been reviewed and is accurate and complete Lyndal Pulley, DO

## 2022-04-24 NOTE — Assessment & Plan Note (Signed)
Injection given April 2, 24 patient responded well to the injection.  Hopefully this will knock out the rest of her pain.  Discussed with patient icing regimen at home continue to monitor.  Worsening pain advanced imaging may be warranted.  Hopeful that patient will continue to make the improvement that she has been making over the course of time

## 2022-04-24 NOTE — Patient Instructions (Addendum)
Injection in foot today Good to see you! Congrats on you know what See you again in 2 months

## 2022-05-01 ENCOUNTER — Telehealth: Payer: Self-pay

## 2022-05-01 ENCOUNTER — Ambulatory Visit (INDEPENDENT_AMBULATORY_CARE_PROVIDER_SITE_OTHER): Payer: Commercial Managed Care - HMO

## 2022-05-01 DIAGNOSIS — L509 Urticaria, unspecified: Secondary | ICD-10-CM | POA: Diagnosis not present

## 2022-05-01 MED ORDER — OMALIZUMAB 150 MG/ML ~~LOC~~ SOSY
150.0000 mg | PREFILLED_SYRINGE | Freq: Once | SUBCUTANEOUS | Status: AC
Start: 2022-05-01 — End: 2022-05-01
  Administered 2022-05-01: 150 mg via SUBCUTANEOUS

## 2022-05-01 NOTE — Telephone Encounter (Signed)
Patient was in today for her 1 month Xolair injection. Patient wanted to know your thoughts on her continuing Xolair versus taking a break from it and seeing what happens like she did last year? Patient is okay either way you recommend but wanted to hold off on scheduling a 1 month appointment to come back until hearing your thoughts.

## 2022-05-01 NOTE — Progress Notes (Signed)
Patient here for her 4 week injection of Xolair for Urticaria.    Xolair 150mg  injected into both right and left upper arms.Patient tolerated procedure well.    BP before injection: 97/62 BP 15 minutes after injection: 90/63 BP 30 minutes after injection: 86/63   LOT: [id number] EXP: 04/2023 [id number]   Evorn Gong, RMA

## 2022-05-02 NOTE — Telephone Encounter (Signed)
There is really no evidence for whether to continue or try coming off. We know that about 20% of people with chronic urticaria will have it clear up/stop happening each year. For each additional year, another 20% of people will stop getting it. If she is okay with the chance that it could come back and she would need to restart treatment, I think it is reasonable to try stopping it and see how she does, since she may be one of the 1 in 5 people whose chronic urticaria has cleared. Thank you!

## 2022-05-03 ENCOUNTER — Other Ambulatory Visit: Payer: Self-pay | Admitting: Family

## 2022-05-03 DIAGNOSIS — Z1231 Encounter for screening mammogram for malignant neoplasm of breast: Secondary | ICD-10-CM

## 2022-05-09 NOTE — Telephone Encounter (Signed)
Advised patient Dr. Onnie Boer comments concerning coming off of the Xolair. Patient would like to d/c and will let us know if and when she breaks out and would like to restart. Butch Penny., RMA

## 2022-05-10 ENCOUNTER — Ambulatory Visit: Payer: Commercial Managed Care - HMO | Admitting: Family Medicine

## 2022-05-16 ENCOUNTER — Encounter: Payer: Commercial Managed Care - HMO | Admitting: Dermatology

## 2022-05-30 ENCOUNTER — Ambulatory Visit
Admission: RE | Admit: 2022-05-30 | Discharge: 2022-05-30 | Disposition: A | Discharge status: Home / Self Care | Payer: Commercial Managed Care - HMO | Source: Ambulatory Visit | Attending: Family | Admitting: Family

## 2022-05-30 DIAGNOSIS — Z1231 Encounter for screening mammogram for malignant neoplasm of breast: Secondary | ICD-10-CM | POA: Diagnosis not present

## 2022-06-19 ENCOUNTER — Other Ambulatory Visit: Payer: Self-pay | Admitting: Family

## 2022-06-19 DIAGNOSIS — G4700 Insomnia, unspecified: Secondary | ICD-10-CM

## 2022-06-21 NOTE — Telephone Encounter (Signed)
Pt called in to make an appt with Arnett. She's booked for 6/18

## 2022-07-10 ENCOUNTER — Ambulatory Visit: Payer: Commercial Managed Care - HMO | Admitting: Family

## 2022-07-10 ENCOUNTER — Encounter: Payer: Self-pay | Admitting: Family

## 2022-07-10 ENCOUNTER — Telehealth: Payer: Self-pay | Admitting: Family

## 2022-07-10 VITALS — BP 110/76 | HR 80 | Temp 98.2°F | Ht 62.0 in | Wt 120.2 lb

## 2022-07-10 DIAGNOSIS — E538 Deficiency of other specified B group vitamins: Secondary | ICD-10-CM

## 2022-07-10 DIAGNOSIS — F419 Anxiety disorder, unspecified: Secondary | ICD-10-CM | POA: Diagnosis not present

## 2022-07-10 DIAGNOSIS — Z8639 Personal history of other endocrine, nutritional and metabolic disease: Secondary | ICD-10-CM

## 2022-07-10 DIAGNOSIS — M5416 Radiculopathy, lumbar region: Secondary | ICD-10-CM | POA: Diagnosis not present

## 2022-07-10 DIAGNOSIS — Z13220 Encounter for screening for lipoid disorders: Secondary | ICD-10-CM

## 2022-07-10 DIAGNOSIS — G4700 Insomnia, unspecified: Secondary | ICD-10-CM

## 2022-07-10 DIAGNOSIS — Z136 Encounter for screening for cardiovascular disorders: Secondary | ICD-10-CM

## 2022-07-10 MED ORDER — SERTRALINE HCL 50 MG PO TABS
50.0000 mg | ORAL_TABLET | Freq: Every day | ORAL | 3 refills | Status: DC
Start: 2022-07-10 — End: 2022-07-11

## 2022-07-10 MED ORDER — GABAPENTIN 300 MG PO CAPS: 300.0000 mg | ORAL_CAPSULE | Freq: Every day | ORAL | 3 refills | Status: DC

## 2022-07-10 NOTE — Telephone Encounter (Signed)
Pt called back to see if you would send in low dose of Xanax as yall had discussed in the visit today.

## 2022-07-10 NOTE — Assessment & Plan Note (Signed)
Chronic, stable.  Continue gabapentin 300 mg nightly

## 2022-07-10 NOTE — Telephone Encounter (Signed)
Pt called in stating that she will like to speak to [facility] CMA concerning a change/adding of med as per today visit.

## 2022-07-10 NOTE — Assessment & Plan Note (Signed)
Particularly aggravated with travel.  We agreed to start low-dose Zoloft 50 mg nightly.  She will let me know how she is doing.  Previously Atarax was quite sedating for her.  I would consider low-dose, infrequent use of Xanax if needed

## 2022-07-10 NOTE — Progress Notes (Signed)
Assessment & Plan:  Anxiety Assessment & Plan: Particularly aggravated with travel.  We agreed to start low-dose Zoloft 50 mg nightly.  She will let me know how she is doing.  Previously Atarax was quite sedating for her.  I would consider low-dose, infrequent use of Xanax if needed  Orders: -     Sertraline HCl; Take 1 tablet (50 mg total) by mouth at bedtime.  Dispense: 90 tablet; Refill: 3 -     TSH; Future -     Iron, TIBC and Ferritin Panel; Future -     B12 and Folate Panel; Future  B12 deficiency -     CBC with Differential/Platelet; Future -     Comprehensive metabolic panel; Future -     Iron, TIBC and Ferritin Panel; Future -     B12 and Folate Panel; Future  Lumbar radiculopathy Assessment & Plan: Chronic, stable.  Continue gabapentin 300 mg nightly  Orders: -     Gabapentin; Take 1 capsule (300 mg total) by mouth at bedtime.  Dispense: 90 capsule; Refill: 3  Encounter for lipid screening for cardiovascular disease -     Hemoglobin A1c; Future -     Lipid panel; Future  History of vitamin D deficiency -     VITAMIN D 25 Hydroxy (Vit-D Deficiency, Fractures); Future  Insomnia, unspecified type Assessment & Plan: Chronic, stable.  Continue Ambien 10mg  at bedtime prn.       Return precautions given.   Risks, benefits, and alternatives of the medications and treatment plan prescribed today were discussed, and patient expressed understanding.   Education regarding symptom management and diagnosis given to patient on AVS either electronically or printed.  Return in about 3 months (around 10/10/2022) for Complete Physical Exam, Fasting labs in 2-3 weeks.  Jodi Plowman, FNP  Subjective:    Patient ID: Jodi Anderson, female    DOB: 01-May-1960, 62 y.o.   MRN: [id number]  CC: Jodi Anderson is a 62 y.o. female who presents today for follow up.   HPI: She describes episodic anxiety, particularly when she feels overwhelmed or is traveling.  Interested in  taking daily medication for this.  Denies chest pain or palpitations   she is compliant with Ambien 10 mg ( taking 1/3 tablet) taken as needed which is helpful for sleep.  No depression   She is doing well on gabapentin 300mg  qhs for back or left foot pain.    Mammogram up-to-date Allergies: Codeine Current Outpatient Medications on File Prior to Visit  Medication Sig Dispense Refill   bimatoprost (LATISSE) 0.03 % ophthalmic solution Place 1 application  into both eyes at bedtime. Place one drop on applicator and apply evenly along the skin of the upper eyelid at base of eyelashes once daily at bedtime; repeat procedure for second eye (use a clean applicator). 3 mL 12   diclofenac (VOLTAREN) 75 MG EC tablet Take 1 tablet (75 mg total) by mouth 2 (two) times daily. 180 tablet 1   metaxalone (SKELAXIN) 800 MG tablet Take 1 tablet (800 mg total) by mouth 2 (two) times daily as needed for muscle spasms. 30 tablet 1   omalizumab (XOLAIR) 150 MG/ML prefilled syringe INJECT 300MG  SUBCUTANEOUSLY  EVERY 4 WEEKS 2 mL 3   triamcinolone (KENALOG) 0.025 % ointment Apply to affected areas 1-2 times daily as needed. 80 g 1   triamcinolone cream (KENALOG) 0.1 % APPLY TO AFFECTED AREAS WITH ECZEMA TWICE DAILY AS NEEDED FOR UP TO 14 DAYS.  AVOID FACE, GROIN AND AXILLA.  RISK OF SKIN ATROPHY WITH LONG USE REVIEWED. 45 g 3   zolpidem (AMBIEN) 10 MG tablet TAKE 1 TABLET BY MOUTH AT BEDTIME AS NEEDED FOR SLEEP 30 tablet 2   No current facility-administered medications on file prior to visit.    Review of Systems  Constitutional:  Negative for chills and fever.  Respiratory:  Negative for cough.   Cardiovascular:  Negative for chest pain and palpitations.  Gastrointestinal:  Negative for nausea and vomiting.  Psychiatric/Behavioral:  Positive for sleep disturbance. Negative for suicidal ideas. The patient is nervous/anxious.       Objective:    BP 110/76   Pulse 80   Temp 98.2 F (36.8 C) (Oral)   Ht  5\' 2"  (1.575 m)   Wt 120 lb 3.2 oz (54.5 kg)   LMP 06/23/2014 (Approximate)   SpO2 98%   BMI 21.98 kg/m  BP Readings from Last 3 Encounters:  07/10/22 110/76  04/24/22 106/62  03/08/22 110/80   Wt Readings from Last 3 Encounters:  07/10/22 120 lb 3.2 oz (54.5 kg)  04/24/22 124 lb (56.2 kg)  03/08/22 129 lb (58.5 kg)      07/10/2022   11:56 AM 05/22/2021    3:52 PM 02/10/2020    1:41 PM  Depression screen PHQ 2/9  Decreased Interest 0 0 0  Down, Depressed, Hopeless 0 0 0  PHQ - 2 Score 0 0 0  Altered sleeping  0   Tired, decreased energy  0   Change in appetite  0   Feeling bad or failure about yourself   0   Trouble concentrating  0   Moving slowly or fidgety/restless  0   Suicidal thoughts  0   PHQ-9 Score  0   Difficult doing work/chores  Not difficult at all      Physical Exam Vitals reviewed.  Constitutional:      Appearance: She is well-developed.  Eyes:     Conjunctiva/sclera: Conjunctivae normal.  Cardiovascular:     Rate and Rhythm: Normal rate and regular rhythm.     Pulses: Normal pulses.     Heart sounds: Normal heart sounds.  Pulmonary:     Effort: Pulmonary effort is normal.     Breath sounds: Normal breath sounds. No wheezing, rhonchi or rales.  Skin:    General: Skin is warm and dry.  Neurological:     Mental Status: She is alert.  Psychiatric:        Speech: Speech normal.        Behavior: Behavior normal.        Thought Content: Thought content normal.

## 2022-07-10 NOTE — Assessment & Plan Note (Signed)
Chronic, stable.  Continue Ambien 10mg  at bedtime prn.

## 2022-07-11 ENCOUNTER — Other Ambulatory Visit: Payer: Self-pay

## 2022-07-11 MED ORDER — ALPRAZOLAM 0.25 MG PO TABS
0.2500 mg | ORAL_TABLET | Freq: Every day | ORAL | 0 refills | Status: DC | PRN
Start: 2022-07-11 — End: 2023-07-17

## 2022-07-11 NOTE — Telephone Encounter (Signed)
Spoke to pt and went over note in detail and pt verbalized understanding. Pt would like to hold off on the Zoloft for now.

## 2022-07-11 NOTE — Addendum Note (Signed)
Addended by: Allegra Grana on: 07/11/2022 01:22 PM   Modules accepted: Orders

## 2022-07-11 NOTE — Progress Notes (Signed)
D/C Pt Zoloft

## 2022-07-11 NOTE — Telephone Encounter (Signed)
Call pt  I have sent in 30 tablets of Xanax 0.25 mg.  Please advise to use  conservatively.  Advise no alcohol use with Xanax   Does she still plan to start Zoloft? If not, Dc on chart    Please schedule a follow-up with me within the next 3 months,  Sooner if needed

## 2022-07-23 ENCOUNTER — Other Ambulatory Visit (INDEPENDENT_AMBULATORY_CARE_PROVIDER_SITE_OTHER): Payer: Commercial Managed Care - HMO

## 2022-07-23 DIAGNOSIS — E538 Deficiency of other specified B group vitamins: Secondary | ICD-10-CM | POA: Diagnosis not present

## 2022-07-23 DIAGNOSIS — Z136 Encounter for screening for cardiovascular disorders: Secondary | ICD-10-CM

## 2022-07-23 DIAGNOSIS — Z8639 Personal history of other endocrine, nutritional and metabolic disease: Secondary | ICD-10-CM

## 2022-07-23 DIAGNOSIS — Z13220 Encounter for screening for lipoid disorders: Secondary | ICD-10-CM

## 2022-07-23 DIAGNOSIS — F419 Anxiety disorder, unspecified: Secondary | ICD-10-CM | POA: Diagnosis not present

## 2022-07-23 LAB — CBC WITH DIFFERENTIAL/PLATELET
Basophils Absolute: 0.1 10*3/uL (ref 0.0–0.1)
Basophils Relative: 1.9 % (ref 0.0–3.0)
Eosinophils Absolute: 0.1 10*3/uL (ref 0.0–0.7)
Eosinophils Relative: 2.2 % (ref 0.0–5.0)
HCT: 41.1 % (ref 36.0–46.0)
Hemoglobin: 13.5 g/dL (ref 12.0–15.0)
Lymphocytes Relative: 33.5 % (ref 12.0–46.0)
Lymphs Abs: 1.3 10*3/uL (ref 0.7–4.0)
MCHC: 32.8 g/dL (ref 30.0–36.0)
MCV: 91.8 fl (ref 78.0–100.0)
Monocytes Absolute: 0.5 10*3/uL (ref 0.1–1.0)
Monocytes Relative: 12.7 % — ABNORMAL HIGH (ref 3.0–12.0)
Neutro Abs: 2 10*3/uL (ref 1.4–7.7)
Neutrophils Relative %: 49.7 % (ref 43.0–77.0)
Platelets: 267 10*3/uL (ref 150.0–400.0)
RBC: 4.47 Mil/uL (ref 3.87–5.11)
RDW: 13.5 % (ref 11.5–15.5)
WBC: 4 10*3/uL (ref 4.0–10.5)

## 2022-07-23 LAB — LIPID PANEL
Cholesterol: 219 mg/dL — ABNORMAL HIGH (ref 0–200)
HDL: 79.6 mg/dL (ref >39.00)
LDL Cholesterol: 122 mg/dL — ABNORMAL HIGH (ref 0–99)
NonHDL: 139.7
Total CHOL/HDL Ratio: 3
Triglycerides: 90 mg/dL (ref 0.0–149.0)
VLDL: 18 mg/dL (ref 0.0–40.0)

## 2022-07-23 LAB — COMPREHENSIVE METABOLIC PANEL
ALT: 17 U/L (ref 0–35)
AST: 20 U/L (ref 0–37)
Albumin: 4.3 g/dL (ref 3.5–5.2)
Alkaline Phosphatase: 54 U/L (ref 39–117)
BUN: 22 mg/dL (ref 6–23)
CO2: 27 mEq/L (ref 19–32)
Calcium: 9.7 mg/dL (ref 8.4–10.5)
Chloride: 104 mEq/L (ref 96–112)
Creatinine, Ser: 0.68 mg/dL (ref 0.40–1.20)
GFR: 94.02 mL/min (ref >60.00)
Glucose, Bld: 88 mg/dL (ref 70–99)
Potassium: 4.2 mEq/L (ref 3.5–5.1)
Sodium: 139 mEq/L (ref 135–145)
Total Bilirubin: 0.5 mg/dL (ref 0.2–1.2)
Total Protein: 6.6 g/dL (ref 6.0–8.3)

## 2022-07-23 LAB — VITAMIN D 25 HYDROXY (VIT D DEFICIENCY, FRACTURES): VITD: 28.48 ng/mL — ABNORMAL LOW (ref 30.00–100.00)

## 2022-07-23 LAB — B12 AND FOLATE PANEL
Folate: 6.4 ng/mL (ref >5.9)
Vitamin B-12: 194 pg/mL — ABNORMAL LOW (ref 211–911)

## 2022-07-23 LAB — HEMOGLOBIN A1C: Hgb A1c MFr Bld: 5.8 % (ref 4.6–6.5)

## 2022-07-23 LAB — TSH: TSH: 0.84 u[IU]/mL (ref 0.35–5.50)

## 2022-07-24 ENCOUNTER — Telehealth: Payer: Self-pay | Admitting: Family

## 2022-07-24 ENCOUNTER — Other Ambulatory Visit: Payer: Self-pay | Admitting: Family

## 2022-07-24 ENCOUNTER — Other Ambulatory Visit: Payer: Self-pay | Admitting: Family Medicine

## 2022-07-24 DIAGNOSIS — E538 Deficiency of other specified B group vitamins: Secondary | ICD-10-CM

## 2022-07-24 DIAGNOSIS — M5416 Radiculopathy, lumbar region: Secondary | ICD-10-CM

## 2022-07-24 LAB — IRON,TIBC AND FERRITIN PANEL
%SAT: 30 % (calc) (ref 16–45)
Ferritin: 43 ng/mL (ref 16–288)
Iron: 105 ug/dL (ref 45–160)
TIBC: 352 mcg/dL (calc) (ref 250–450)

## 2022-07-24 NOTE — Telephone Encounter (Signed)
Pt would like to be called concerning her blood work

## 2022-07-24 NOTE — Telephone Encounter (Signed)
Spoke to pt and informed her that provider has not yet resulted the labs but when she does I will call her with results

## 2022-07-25 ENCOUNTER — Telehealth: Payer: Self-pay

## 2022-07-25 ENCOUNTER — Other Ambulatory Visit: Payer: Self-pay | Admitting: Family

## 2022-07-25 DIAGNOSIS — E538 Deficiency of other specified B group vitamins: Secondary | ICD-10-CM

## 2022-07-25 MED ORDER — CYANOCOBALAMIN 1000 MCG/ML IJ SOLN
INTRAMUSCULAR | 11 refills | Status: DC
Start: 2022-07-25 — End: 2023-07-31

## 2022-07-25 NOTE — Telephone Encounter (Signed)
Pt has been notified.

## 2022-07-25 NOTE — Telephone Encounter (Signed)
Patient states Jodi Plowman, FNP, would like for her to have B12 injections.  Patient states she would like to know if she can have the B12 injections done at [facility].  If so, would we need to send in a prescription.  Patient states she would like for [place] to pls call her and let her know.

## 2022-07-30 ENCOUNTER — Other Ambulatory Visit (INDEPENDENT_AMBULATORY_CARE_PROVIDER_SITE_OTHER): Payer: Commercial Managed Care - HMO

## 2022-07-30 DIAGNOSIS — E538 Deficiency of other specified B group vitamins: Secondary | ICD-10-CM

## 2022-07-30 NOTE — Addendum Note (Signed)
Addended by: Warden Fillers on: 07/30/2022 03:36 PM   Modules accepted: Orders

## 2022-07-31 LAB — CELIAC DISEASE AB SCREEN W/RFX
Antigliadin Abs, IgA: 3 units (ref 0–19)
IgA/Immunoglobulin A, Serum: 140 mg/dL (ref 87–352)

## 2022-07-31 LAB — METHYLMALONIC ACID, SERUM

## 2022-08-01 LAB — INTRINSIC FACTOR ANTIBODIES: Intrinsic Factor Abs, Serum: 1 AU/mL (ref 0.0–1.1)

## 2022-08-01 LAB — CELIAC DISEASE AB SCREEN W/RFX: Transglutaminase IgA: <2 U/mL (ref 0–3)

## 2022-08-01 LAB — HOMOCYSTEINE: Homocysteine: 11.8 umol/L (ref 0.0–17.2)

## 2022-08-14 ENCOUNTER — Other Ambulatory Visit: Payer: Self-pay | Admitting: Family Medicine

## 2022-08-14 DIAGNOSIS — M5416 Radiculopathy, lumbar region: Secondary | ICD-10-CM

## 2022-08-16 ENCOUNTER — Other Ambulatory Visit: Payer: Self-pay | Admitting: Family Medicine

## 2022-08-16 NOTE — Progress Notes (Unsigned)
[name] [address] [place] [address] Phone: [phone] Subjective:   Bruce Donath, am serving as a scribe for Dr. Antoine Primas.  I'm seeing this patient by the request  of:  Allegra Grana, FNP  CC: left foot pain   [id number]  04/24/2022 Injection given April 2, 24 patient responded well to the injection.  Hopefully this will knock out the rest of her pain.  Discussed with patient icing regimen at home continue to monitor.  Worsening pain advanced imaging may be warranted.  Hopeful that patient will continue to make the improvement that she has been making over the course of time   Updated 08/21/2022 BLAKLEY MICHNA is a 62 y.o. female coming in with complaint of L foot pain. Patient has had pain intermittently over ATF of L ankle. Injections have been helpful. Taking diclofenac currently. Taking 300mg  of gabapentin at night.       Past Medical History:  Diagnosis Date   Cardiac murmur    Palpitations    Past Surgical History:  Procedure Laterality Date   CESAREAN SECTION     3x   COLONOSCOPY  2016   PINNING FOR LEFT TOE FRACTURE     tummy tuck     Social History   Socioeconomic History   Marital status: Married    Spouse name: Not on file   Number of children: Not on file   Years of education: Not on file   Highest education level: Not on file  Occupational History   Not on file  Tobacco Use   Smoking status: Never   Smokeless tobacco: Never  Vaping Use   Vaping status: Never Used  Substance and Sexual Activity   Alcohol use: Yes   Drug use: No   Sexual activity: Not on file  Other Topics Concern   Not on file  Social History Narrative   Lives in [place]. 4 children    1 son in program at [facility] has autism, 110 years old, lives in home in [facility] Has dog and 3 cats.      Work - homemaker      Diet - regular diet      Exercise - walks daily, weights, trampoline   Social Determinants of Health    Financial Resource Strain: Low Risk  (01/02/2022)   Received from [facility], [facility]   Overall Financial Resource Strain (CARDIA)    Difficulty of Paying Living Expenses: Not hard at all  Food Insecurity: No Food Insecurity (01/02/2022)   Received from [facility], [facility]   Hunger Vital Sign    Worried About Running Out of Food in the Last Year: Never true    Ran Out of Food in the Last Year: Never true  Transportation Needs: No Transportation Needs (01/02/2022)   Received from [facility], [facility]   [facility] - Transportation    In the past 12 months, has lack of transportation kept you from medical appointments or from getting medications?: No    Lack of Transportation (Non-Medical): No  Physical Activity: Not on file  Stress: Not on file  Social Connections: Not on file   Allergies  Allergen Reactions   Codeine Nausea Only   Family History  Problem Relation Age of Onset   Cancer Maternal Aunt        lung   Cancer Paternal Grandmother  breast   Epilepsy Son    Breast cancer Neg Hx       Current Outpatient Medications (Respiratory):    omalizumab (XOLAIR) 150 MG/ML prefilled syringe, INJECT 300MG  SUBCUTANEOUSLY  EVERY 4 WEEKS  Current Outpatient Medications (Analgesics):    diclofenac (VOLTAREN) 75 MG EC tablet, TAKE 1 TABLET BY MOUTH TWICE DAILY  Current Outpatient Medications (Hematological):    cyanocobalamin (VITAMIN B12) 1000 MCG/ML injection, 1000 mcg (1 mL) intramuscular injection in the thigh ( vastus lateralis) once per week for four weeks, followed by 1000 mcg IM injection once per month x 6  Current Outpatient Medications (Other):    ALPRAZolam (XANAX) 0.25 MG tablet, Take 1 tablet (0.25 mg total) by mouth daily as needed for anxiety.   bimatoprost (LATISSE) 0.03 % ophthalmic solution, Place 1 application  into both eyes at  bedtime. Place one drop on applicator and apply evenly along the skin of the upper eyelid at base of eyelashes once daily at bedtime; repeat procedure for second eye (use a clean applicator).   gabapentin (NEURONTIN) 300 MG capsule, TAKE 1 CAPSULE BY MOUTH AT BEDTIME   metaxalone (SKELAXIN) 800 MG tablet, Take 1 tablet (800 mg total) by mouth 2 (two) times daily as needed for muscle spasms.   triamcinolone (KENALOG) 0.025 % ointment, Apply to affected areas 1-2 times daily as needed.   triamcinolone cream (KENALOG) 0.1 %, APPLY TO AFFECTED AREAS WITH ECZEMA TWICE DAILY AS NEEDED FOR UP TO 14 DAYS. AVOID FACE, GROIN AND AXILLA.  RISK OF SKIN ATROPHY WITH LONG USE REVIEWED.   zolpidem (AMBIEN) 10 MG tablet, TAKE 1 TABLET BY MOUTH AT BEDTIME AS NEEDED FOR SLEEP   Reviewed prior external information including notes and imaging from  primary care provider As well as notes that were available from care everywhere and other healthcare systems.  Past medical history, social, surgical and family history all reviewed in electronic medical record.  No pertanent information unless stated regarding to the chief complaint.   Review of Systems:  No headache, visual changes, nausea, vomiting, diarrhea, constipation, dizziness, abdominal pain, skin rash, fevers, chills, night sweats, weight loss, swollen lymph nodes, body aches, joint swelling, chest pain, shortness of breath, mood changes. POSITIVE muscle aches  Objective  Blood pressure 100/68, pulse 68, height 5\' 2"  (1.575 m), weight 121 lb (54.9 kg), last menstrual period 06/23/2014.   General: No apparent distress alert and oriented x3 mood and affect normal, dressed appropriately.  HEENT: Pupils equal, extraocular movements intact  Respiratory: Patient's speak in full sentences and does not appear short of breath  Cardiovascular: No lower extremity edema, non tender, no erythema  Left foot exam shows tightness noted still over the midfoot but nontender.   Patient does have pain over the anterior lateral aspect of the ankle.  Procedure: Real-time Ultrasound Guided Injection of left ankle joint Device: GE Logiq Q7 Ultrasound guided injection is preferred based studies that show increased duration, increased effect, greater accuracy, decreased procedural pain, increased response rate, and decreased cost with ultrasound guided versus blind injection.  Verbal informed consent obtained.  Time-out conducted.  Noted no overlying erythema, induration, or other signs of local infection.  Skin prepped in a sterile fashion.  Local anesthesia: Topical Ethyl chloride.  With sterile technique and under real time ultrasound guidance: With a 25-gauge half inch needle injected with 0.5 cc of 0.5% Marcaine and 0.5 cc of Kenalog 40 mg per Completed without difficulty  Advised to call if fevers/chills, erythema, induration, drainage, or  persistent bleeding.  Impression: Technically successful ultrasound guided injection.   Impression and Recommendations:     The above documentation has been reviewed and is accurate and complete Judi Saa, DO

## 2022-08-21 ENCOUNTER — Encounter: Payer: Self-pay | Admitting: Family Medicine

## 2022-08-21 ENCOUNTER — Other Ambulatory Visit: Payer: Self-pay

## 2022-08-21 ENCOUNTER — Ambulatory Visit (INDEPENDENT_AMBULATORY_CARE_PROVIDER_SITE_OTHER): Payer: Commercial Managed Care - HMO | Admitting: Family Medicine

## 2022-08-21 VITALS — BP 100/68 | HR 68 | Ht 62.0 in | Wt 121.0 lb

## 2022-08-21 DIAGNOSIS — M25472 Effusion, left ankle: Secondary | ICD-10-CM

## 2022-08-21 DIAGNOSIS — M79672 Pain in left foot: Secondary | ICD-10-CM

## 2022-08-21 DIAGNOSIS — M19072 Primary osteoarthritis, left ankle and foot: Secondary | ICD-10-CM

## 2022-08-21 NOTE — Patient Instructions (Addendum)
Injected foot today

## 2022-08-21 NOTE — Assessment & Plan Note (Signed)
Stable continue to monitor but will do well

## 2022-08-21 NOTE — Assessment & Plan Note (Signed)
Patient is going to continue to make significant activity.  I believe patient will do well with conservative therapy.  Follow-up again in 6 to 8 weeks.

## 2022-09-05 ENCOUNTER — Ambulatory Visit: Payer: Commercial Managed Care - HMO | Admitting: Neurosurgery

## 2022-09-06 ENCOUNTER — Ambulatory Visit: Payer: Commercial Managed Care - HMO | Admitting: Family Medicine

## 2022-09-10 ENCOUNTER — Other Ambulatory Visit: Payer: Self-pay | Admitting: Family

## 2022-09-10 DIAGNOSIS — G4700 Insomnia, unspecified: Secondary | ICD-10-CM

## 2022-09-10 MED ORDER — ZOLPIDEM TARTRATE 10 MG PO TABS: 10.0000 mg | ORAL_TABLET | Freq: Every evening | ORAL | 1 refills | Status: DC | PRN

## 2022-09-10 NOTE — Telephone Encounter (Signed)
Pt requesting due to being out of town w/o medication.

## 2022-09-10 NOTE — Telephone Encounter (Signed)
Prescription Request  09/10/2022  LOV: 07/10/2022  What is the name of the medication or equipment? Ambien  Have you contacted your pharmacy to request a refill? Yes   Which pharmacy would you like this sent to?  [facility] [address] [place]  Patient notified that their request is being sent to the clinical staff for review and that they should receive a response within 2 business days.   Please advise at Mobile [phone] (mobile)

## 2022-09-11 ENCOUNTER — Telehealth: Payer: Self-pay

## 2022-09-11 NOTE — Telephone Encounter (Signed)
Patient states she is calling to find out if we have sent her medication refill for zolpidem (AMBIEN) [facility] MG [facility] [address], [place].  I let her know that we have a receipt confirmed by pharmacy on 09/10/2022 at 4:53pm.

## 2022-09-11 NOTE — Telephone Encounter (Signed)
Spoke to pt and she has picked up her medication!

## 2022-09-17 ENCOUNTER — Ambulatory Visit: Payer: Commercial Managed Care - HMO | Admitting: Neurosurgery

## 2022-10-02 ENCOUNTER — Other Ambulatory Visit: Payer: Self-pay

## 2022-10-02 MED ORDER — COMIRNATY 30 MCG/0.3ML IM SUSY
0.3000 mL | PREFILLED_SYRINGE | Freq: Once | INTRAMUSCULAR | 0 refills | Status: AC
  Filled 2022-10-02: qty 0.3, 1d supply, fill #0

## 2022-10-10 ENCOUNTER — Encounter: Payer: Commercial Managed Care - HMO | Admitting: Family

## 2022-10-11 ENCOUNTER — Encounter: Payer: Commercial Managed Care - HMO | Admitting: Dermatology

## 2022-10-25 NOTE — Progress Notes (Signed)
[name] [address] [place] [address] Phone: [phone] Subjective:   Bruce Donath, am serving as a scribe for Dr. Antoine Primas.  I'm seeing this patient by the request  of:  Allegra Grana, FNP  CC: left foot pain   [id number]  08/21/2022 Stable continue to monitor but will do well  Patient is going to continue to make significant activity. I believe patient will do well with conservative therapy. Follow-up again in 6 to 8 weeks.   Updated 10/26/2022 Jodi Anderson is a 62 y.o. female coming in with complaint of L foot pain for past 2 weeks. Patient states that she continues to have pain in ankle mortise. Injection was helpful that was done in April.  Swelling starting to affect daily activities.      Past Medical History:  Diagnosis Date   Cardiac murmur    Palpitations    Past Surgical History:  Procedure Laterality Date   CESAREAN SECTION     3x   COLONOSCOPY  2016   Left Peroneal nerve decompression at the fibular neck Left 02/12/2022   Dr. Ernestine Mcmurray, done at [facility] FOR LEFT TOE FRACTURE     tummy tuck     Social History   Socioeconomic History   Marital status: Married    Spouse name: Not on file   Number of children: Not on file   Years of education: Not on file   Highest education level: Not on file  Occupational History   Not on file  Tobacco Use   Smoking status: Never   Smokeless tobacco: Never  Vaping Use   Vaping status: Never Used  Substance and Sexual Activity   Alcohol use: Yes   Drug use: No   Sexual activity: Not on file  Other Topics Concern   Not on file  Social History Narrative   Lives in [place]. 4 children    1 son in program at [facility] has autism, 62 years old, lives in home in [place]. Has dog and 3 cats.      Work - homemaker      Diet - regular diet      Exercise - walks daily, weights, trampoline   Social Determinants of Health   Financial Resource Strain:  Low Risk  (01/02/2022)   Received from [facility], [facility]   Overall Financial Resource Strain (CARDIA)    Difficulty of Paying Living Expenses: Not hard at all  Food Insecurity: No Food Insecurity (01/02/2022)   Received from [facility], [facility]   Hunger Vital Sign    Worried About Running Out of Food in the Last Year: Never true    Ran Out of Food in the Last Year: Never true  Transportation Needs: No Transportation Needs (01/02/2022)   Received from [facility], [facility]   [facility] - Transportation    In the past 12 months, has lack of transportation kept you from medical appointments or from getting medications?: No    Lack of Transportation (Non-Medical): No  Physical Activity: Not on file  Stress: Not on file  Social Connections: Not on file   Allergies  Allergen Reactions   Codeine Nausea Only   Family History  Problem Relation Age of Onset   Cancer Maternal Aunt        lung   Cancer Paternal Grandmother  breast   Epilepsy Son    Breast cancer Neg Hx       Current Outpatient Medications (Respiratory):    omalizumab (XOLAIR) 150 MG/ML prefilled syringe, INJECT 300MG  SUBCUTANEOUSLY  EVERY 4 WEEKS  Current Outpatient Medications (Analgesics):    diclofenac (VOLTAREN) 75 MG EC tablet, TAKE 1 TABLET BY MOUTH TWICE DAILY  Current Outpatient Medications (Hematological):    cyanocobalamin (VITAMIN B12) 1000 MCG/ML injection, 1000 mcg (1 mL) intramuscular injection in the thigh ( vastus lateralis) once per week for four weeks, followed by 1000 mcg IM injection once per month x 6  Current Outpatient Medications (Other):    ALPRAZolam (XANAX) 0.25 MG tablet, Take 1 tablet (0.25 mg total) by mouth daily as needed for anxiety.   bimatoprost (LATISSE) 0.03 % ophthalmic solution, Place 1 application  into both eyes at bedtime. Place one drop on applicator  and apply evenly along the skin of the upper eyelid at base of eyelashes once daily at bedtime; repeat procedure for second eye (use a clean applicator).   gabapentin (NEURONTIN) 300 MG capsule, TAKE 1 CAPSULE BY MOUTH AT BEDTIME   metaxalone (SKELAXIN) 800 MG tablet, Take 1 tablet (800 mg total) by mouth 2 (two) times daily as needed for muscle spasms.   triamcinolone (KENALOG) 0.025 % ointment, Apply to affected areas 1-2 times daily as needed.   triamcinolone cream (KENALOG) 0.1 %, APPLY TO AFFECTED AREAS WITH ECZEMA TWICE DAILY AS NEEDED FOR UP TO 14 DAYS. AVOID FACE, GROIN AND AXILLA.  RISK OF SKIN ATROPHY WITH LONG USE REVIEWED.   Reviewed prior external information including notes and imaging from  primary care provider As well as notes that were available from care everywhere and other healthcare systems.  Past medical history, social, surgical and family history all reviewed in electronic medical record.  No pertanent information unless stated regarding to the chief complaint.   Review of Systems:  No headache, visual changes, nausea, vomiting, diarrhea, constipation, dizziness, abdominal pain, skin rash, fevers, chills, night sweats, weight loss, swollen lymph nodes, body aches, joint swelling, chest pain, shortness of breath, mood changes. POSITIVE muscle aches  Objective  Blood pressure 102/74, pulse 90, height 5\' 2"  (1.575 m), last menstrual period 06/23/2014, SpO2 98%.   General: No apparent distress alert and oriented x3 mood and affect normal, dressed appropriately.  HEENT: Pupils equal, extraocular movements intact  Respiratory: Patient's speak in full sentences and does not appear short of breath  Cardiovascular: No lower extremity edema, non tender, no erythema  Left ankle does have some swelling noted.  Seems to be more of the sinus tarsi.  Does have some limited range of motion of the foot noted.  Procedure: Real-time Ultrasound Guided Injection of sinus tarsi  Device:  GE Logiq Q7 Ultrasound guided injection is preferred based studies that show increased duration, increased effect, greater accuracy, decreased procedural pain, increased response rate, and decreased cost with ultrasound guided versus blind injection.  Verbal informed consent obtained.  Time-out conducted.  Noted no overlying erythema, induration, or other signs of local infection.  Skin prepped in a sterile fashion.  Local anesthesia: Topical Ethyl chloride.  With sterile technique and under real time ultrasound guidance: With a 25-gauge half inch needle injected with 0.5 cc of 0.5% Marcaine and 0.5 cc of Kenalog 40 mg/mL. Completed without difficulty  Pain immediately improved suggesting accurate placement of the medication.  Advised to call if fevers/chills, erythema, induration, drainage, or persistent bleeding.  Impression: Technically successful ultrasound guided injection.  Impression and Recommendations:    The above documentation has been reviewed and is accurate and complete Judi Saa, DO

## 2022-10-26 ENCOUNTER — Other Ambulatory Visit: Payer: Self-pay

## 2022-10-26 ENCOUNTER — Ambulatory Visit (INDEPENDENT_AMBULATORY_CARE_PROVIDER_SITE_OTHER): Payer: Managed Care, Other (non HMO) | Admitting: Family Medicine

## 2022-10-26 ENCOUNTER — Encounter: Payer: Self-pay | Admitting: Family Medicine

## 2022-10-26 VITALS — BP 102/74 | HR 90 | Ht 62.0 in

## 2022-10-26 DIAGNOSIS — M79672 Pain in left foot: Secondary | ICD-10-CM | POA: Diagnosis not present

## 2022-10-26 DIAGNOSIS — M25572 Pain in left ankle and joints of left foot: Secondary | ICD-10-CM | POA: Diagnosis not present

## 2022-10-26 NOTE — Patient Instructions (Signed)
Injected foot today See me again in

## 2022-11-28 ENCOUNTER — Telehealth: Payer: Self-pay

## 2022-11-28 ENCOUNTER — Ambulatory Visit (INDEPENDENT_AMBULATORY_CARE_PROVIDER_SITE_OTHER): Payer: Managed Care, Other (non HMO) | Admitting: Neurosurgery

## 2022-11-28 ENCOUNTER — Encounter: Payer: Self-pay | Admitting: Neurosurgery

## 2022-11-28 VITALS — BP 108/70 | Ht 62.0 in | Wt 121.0 lb

## 2022-11-28 DIAGNOSIS — G5732 Lesion of lateral popliteal nerve, left lower limb: Secondary | ICD-10-CM | POA: Diagnosis not present

## 2022-11-28 NOTE — Telephone Encounter (Signed)
Patient called stating that her other Dr. Katrinka Blazing from neurosurgery was sending some sort of message or order over to Dr. Katrinka Blazing to order or something. I could not find what she was talking about so I told her I would send you all a message and have you guys call her.

## 2022-11-29 DIAGNOSIS — G5732 Lesion of lateral popliteal nerve, left lower limb: Secondary | ICD-10-CM | POA: Insufficient documentation

## 2022-11-29 NOTE — Progress Notes (Signed)
[name] [address] [place] [address] Phone: [phone] Subjective:   Bruce Donath, am serving as a scribe for Dr. Antoine Primas.  I'm seeing this patient by the request  of:  Allegra Grana, FNP  CC: Left foot pain  [id number]  10/26/2022 L foot injection  Update 11/30/2022 Jodi Anderson is a 62 y.o. female coming in with complaint of L foot pain. Patient states that her pain is in lateral lower leg and into the top of her L foot.    Patient did see neurosurgery previously.  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  Patient was asked to come here for further evaluation and see if she is a good candidate for a diagnostic/therapeutic block of the superficial peroneal nerve at the lateral fibula in the mid calf.    Past Medical History:  Diagnosis Date   Cardiac murmur    Palpitations    Past Surgical History:  Procedure Laterality Date   CESAREAN SECTION     3x   COLONOSCOPY  2016   Left Peroneal nerve decompression at the fibular neck Left 02/12/2022   Dr. Ernestine Mcmurray, done at [facility] FOR LEFT TOE FRACTURE     tummy tuck     Social History   Socioeconomic History   Marital status: Married    Spouse name: Not on file   Number of children: Not on file   Years of education: Not on file   Highest education level: Not on file  Occupational History   Not on file  Tobacco Use   Smoking status: Never   Smokeless tobacco: Never  Vaping Use   Vaping status: Never Used  Substance and Sexual Activity   Alcohol use: Yes   Drug use: No   Sexual activity: Not on file  Other Topics Concern   Not on file  Social History Narrative   Lives in [place]. 4 children    1 son in program at [facility] has autism, 26 years old, lives in home in [place]. Has dog and 3 cats.      Work - homemaker      Diet - regular diet      Exercise - walks daily, weights,  trampoline   Social Determinants of Health   Financial Resource Strain: Low Risk  (01/02/2022)   Received from [facility], [facility]   Overall Financial Resource Strain (CARDIA)    Difficulty of Paying Living Expenses: Not hard at all  Food Insecurity: No Food Insecurity (01/02/2022)   Received from [facility], [facility]   Hunger Vital Sign    Worried About Running Out of Food in the Last Year: Never true    Ran Out of Food in the Last Year: Never true  Transportation Needs: No Transportation Needs (01/02/2022)   Received from [facility], [facility]   [facility] - Transportation    In the past 12 months, has lack of transportation kept you from medical appointments or from getting medications?: No    Lack of Transportation (Non-Medical): No  Physical Activity: Not on file  Stress: Not on file  Social Connections: Not on file   Allergies  Allergen Reactions   Codeine Nausea Only   Family History  Problem Relation Age of Onset   Cancer Maternal Aunt  lung   Cancer Paternal Grandmother        breast   Epilepsy Son    Breast cancer Neg Hx        Current Outpatient Medications (Analgesics):    diclofenac (VOLTAREN) 75 MG EC tablet, TAKE 1 TABLET BY MOUTH TWICE DAILY  Current Outpatient Medications (Hematological):    cyanocobalamin (VITAMIN B12) 1000 MCG/ML injection, 1000 mcg (1 mL) intramuscular injection in the thigh ( vastus lateralis) once per week for four weeks, followed by 1000 mcg IM injection once per month x 6  Current Outpatient Medications (Other):    ALPRAZolam (XANAX) 0.25 MG tablet, Take 1 tablet (0.25 mg total) by mouth daily as needed for anxiety.   bimatoprost (LATISSE) 0.03 % ophthalmic solution, Place 1 application  into both eyes at bedtime. Place one drop on applicator and apply evenly along the skin of the upper eyelid at base of  eyelashes once daily at bedtime; repeat procedure for second eye (use a clean applicator).   gabapentin (NEURONTIN) 300 MG capsule, TAKE 1 CAPSULE BY MOUTH AT BEDTIME   metaxalone (SKELAXIN) 800 MG tablet, Take 1 tablet (800 mg total) by mouth 2 (two) times daily as needed for muscle spasms.   triamcinolone (KENALOG) 0.025 % ointment, Apply to affected areas 1-2 times daily as needed.   zolpidem (AMBIEN) 10 MG tablet, Take 10 mg by mouth at bedtime as needed.   Reviewed prior external information including notes and imaging from  primary care provider As well as notes that were available from care everywhere and other healthcare systems.  Past medical history, social, surgical and family history all reviewed in electronic medical record.  No pertanent information unless stated regarding to the chief complaint.   Review of Systems:  No headache, visual changes, nausea, vomiting, diarrhea, constipation, dizziness, abdominal pain, skin rash, fevers, chills, night sweats, weight loss, swollen lymph nodes, body aches, joint swelling, chest pain, shortness of breath, mood changes. POSITIVE muscle aches  Objective  Blood pressure (!) 92/58, pulse 67, height 5\' 2"  (1.575 m), weight 123 lb (55.8 kg), last menstrual period 06/23/2014, SpO2 97%.   General: No apparent distress alert and oriented x3 mood and affect normal, dressed appropriately.  HEENT: Pupils equal, extraocular movements intact  Respiratory: Patient's speak in full sentences and does not appear short of breath  Cardiovascular: No lower extremity edema, non tender, no erythema  Left ankle exam shows  Procedure: Real-time Ultrasound Guided Injection of the superficial peroneal nerve Device: GE Logiq Q7 Ultrasound guided injection is preferred based studies that show increased duration, increased effect, greater accuracy, decreased procedural pain, increased response rate, and decreased cost with ultrasound guided versus blind  injection.  Verbal informed consent obtained.  Time-out conducted.  Noted no overlying erythema, induration, or other signs of local infection.  Skin prepped in a sterile fashion.  Local anesthesia: Topical Ethyl chloride.  With sterile technique and under real time ultrasound guidance: With a 25-gauge 1-1/2 inch needle and patient just found the superficial peroneal nerve between the extensor digitorum and the peroneal brevis musculature.  Patient was injected with 2 cc of 0.5% Marcaine and then 1 cc of Kenalog 40 mg/mL within the sheath.  Was able to see some surrounding questionable scar tissue from previous injury in the vicinity Completed without difficulty  Pain completely gone and patient did have full strength still. Advised to call if fevers/chills, erythema, induration, drainage, or persistent bleeding.  Impression: Technically successful ultrasound guided injection.    Impression  and Recommendations:     The above documentation has been reviewed and is accurate and complete Judi Saa, DO

## 2022-11-29 NOTE — Progress Notes (Addendum)
Referring Physician:  Allegra Grana, FNP [address] [place],  [place] [address]  Primary Physician:  Allegra Grana, FNP  History of Present Illness: 11/28/2022 Jodi Anderson is here today with a chief complaint of history of left lower extremity peripheral neuropathy, peroneal neuropathy.  I have known her for significant time for my previous practice.  She had a left-sided peroneal nerve decompression which gave her significant improvement in her foot pain overall, however approximately 9 months later she is having some continued symptoms in her superficial peroneal distribution.  She has had foot injections and continue therapy.  She has noticed some intermittent weakness but not having significant weakness issues at the time.  She has been following with her pain and physiatry team.   Review of Systems:  A 10 point review of systems is negative, except for the pertinent positives and negatives detailed in the HPI.  Past Medical History: Past Medical History:  Diagnosis Date   Cardiac murmur    Palpitations     Past Surgical History: Past Surgical History:  Procedure Laterality Date   CESAREAN SECTION     3x   COLONOSCOPY  2016   Left Peroneal nerve decompression at the fibular neck Left 02/12/2022   Dr. Ernestine Mcmurray, done at [facility] FOR LEFT TOE FRACTURE     tummy tuck      Allergies: Allergies as of 11/28/2022 - Review Complete 11/28/2022  Allergen Reaction Noted   Codeine Nausea Only 04/14/2013    Medications:  Current Outpatient Medications:    ALPRAZolam (XANAX) 0.25 MG tablet, Take 1 tablet (0.25 mg total) by mouth daily as needed for anxiety., Disp: 30 tablet, Rfl: 0   bimatoprost (LATISSE) 0.03 % ophthalmic solution, Place 1 application  into both eyes at bedtime. Place one drop on applicator and apply evenly along the skin of the upper eyelid at base of eyelashes once daily at bedtime; repeat procedure for second eye  (use a clean applicator)., Disp: 3 mL, Rfl: 12   cyanocobalamin (VITAMIN B12) 1000 MCG/ML injection, 1000 mcg (1 mL) intramuscular injection in the thigh ( vastus lateralis) once per week for four weeks, followed by 1000 mcg IM injection once per month x 6, Disp: 1 mL, Rfl: 11   diclofenac (VOLTAREN) 75 MG EC tablet, TAKE 1 TABLET BY MOUTH TWICE DAILY, Disp: 180 tablet, Rfl: 1   gabapentin (NEURONTIN) 300 MG capsule, TAKE 1 CAPSULE BY MOUTH AT BEDTIME, Disp: 90 capsule, Rfl: 3   metaxalone (SKELAXIN) 800 MG tablet, Take 1 tablet (800 mg total) by mouth 2 (two) times daily as needed for muscle spasms., Disp: 30 tablet, Rfl: 1   triamcinolone (KENALOG) 0.025 % ointment, Apply to affected areas 1-2 times daily as needed., Disp: 80 g, Rfl: 1   zolpidem (AMBIEN) 10 MG tablet, Take 10 mg by mouth at bedtime as needed., Disp: , Rfl:   Social History: Social History   Tobacco Use   Smoking status: Never   Smokeless tobacco: Never  Vaping Use   Vaping status: Never Used  Substance Use Topics   Alcohol use: Yes   Drug use: No    Family Medical History: Family History  Problem Relation Age of Onset   Cancer Maternal Aunt        lung   Cancer Paternal Grandmother        breast   Epilepsy Son    Breast cancer Neg Hx     Physical  Examination: Vitals:   11/28/22 1046  BP: 108/70    General: Patient is in no apparent distress. Attention to examination is appropriate.  Neck:   Supple.  Full range of motion.  Respiratory: Patient is breathing without any difficulty.   NEUROLOGICAL:     Awake, alert, oriented to person, place, and time.  Speech is clear and fluent.   Cranial Nerves: Pupils equal round and reactive to light.  Facial tone is symmetric. Shoulder shrug is symmetric. Tongue protrusion is midline.  There is no pronator drift.  Motor Exam:  She continues to have good exertional strength in the lower extremity, no evidence of wasting.  Her incision is clean dry and intact.   Her Tinel at the fibular neck has abated, she now has a Tinel's sign noted at the approximate exit of the superficial peroneal nerve from the fascia on the lateral aspect of the fibular shaft.  Reflexes are nonpathologic  Some decrease sensation noted in the superficial peroneal nerve distribution.  Gait is normal.     Medical Decision Making  Imaging: No new imaging this time  Electrodiagnostics: No new electrodiagnostic testing at this time  I have personally reviewed the images and electrodiagnostics and agree with the above interpretation.  Assessment and Plan: Jodi Anderson is a pleasant 62 y.o. female with a history of a left-sided common peroneal neuropathy.  She underwent a decompression in January 2024.  She had a significant improvement in her left lower extremity pain.  She is now complaining of some symptoms more in her superficial peroneal nerve distribution.  This can sometimes be seen after a peroneal nerve decompression at the fibular neck if she develops a secondary compression at the fascial exit of the superficial peroneal nerve.  She has a significant Tinel at this location.  On physical exam she continues to have good strength without significant muscle wasting.  At this time would like to have her undergo a diagnostic/therapeutic block of the superficial peroneal nerve at the lateral fibula as it exits the fascia in the mid calf.  Would like to follow-up with her afterwards.  This generally straightforward decompression should she respond to the injection.  Thank you for involving me in the care of this patient.    Lovenia Kim MD/MSCR Neurosurgery - Peripheral Nerve Surgery

## 2022-11-30 ENCOUNTER — Encounter: Payer: Self-pay | Admitting: Family Medicine

## 2022-11-30 ENCOUNTER — Other Ambulatory Visit: Payer: Self-pay

## 2022-11-30 ENCOUNTER — Ambulatory Visit: Payer: Managed Care, Other (non HMO) | Admitting: Family Medicine

## 2022-11-30 VITALS — BP 92/58 | HR 67 | Ht 62.0 in | Wt 123.0 lb

## 2022-11-30 DIAGNOSIS — G5732 Lesion of lateral popliteal nerve, left lower limb: Secondary | ICD-10-CM

## 2022-11-30 DIAGNOSIS — M79672 Pain in left foot: Secondary | ICD-10-CM

## 2022-11-30 NOTE — Patient Instructions (Signed)
Injected L foot

## 2022-11-30 NOTE — Assessment & Plan Note (Signed)
Attempted the injection and did discussed with patient that it could take a couple weeks to see how much improvement she has.  Hopefully this does make a difference.  Still believe the patient does have some sinus tarsi syndrome or could have more of a distal superficial nerve injury as well we may want to consider.  Discussed with patient about icing regimen and home exercises otherwise increase activity slowly.  Follow-up with me again in 6 to 8 weeks.

## 2022-12-18 ENCOUNTER — Other Ambulatory Visit: Payer: Self-pay | Admitting: Dermatology

## 2023-02-08 NOTE — Progress Notes (Addendum)
[facility] [address] [place] [address] Phone: [phone] Subjective:   Jodi Anderson, am serving as a scribe for Dr. Antoine Primas.  I'm seeing this patient by the request  of:  Jodi Grana, FNP  CC: Foot pain follow-up  [id number]  11/30/2022 Attempted the injection and did discussed with patient that it could take a couple weeks to see how much improvement she has. Hopefully this does make a difference. Still believe the patient does have some sinus tarsi syndrome or could have more of a distal superficial nerve injury as well we may want to consider. Discussed with patient about icing regimen and home exercises otherwise increase activity slowly. Follow-up with me again in 6 to 8 weeks.   Updated 02/14/2023 Jodi Anderson is a 63 y.o. female coming in with complaint of L foot pain, was given an injection in the superficial peroneal nerve.  This was in November.  Patient states today the foot has been good today but the last 5 days were awful.  Patient states that it is slowly getting better maybe.      Past Medical History:  Diagnosis Date   Cardiac murmur    Palpitations    Past Surgical History:  Procedure Laterality Date   CESAREAN SECTION     3x   COLONOSCOPY  2016   Left Peroneal nerve decompression at the fibular neck Left 02/12/2022   Dr. Ernestine Mcmurray, done at [facility] FOR LEFT TOE FRACTURE     tummy tuck     Social History   Socioeconomic History   Marital status: Married    Spouse name: Not on file   Number of children: Not on file   Years of education: Not on file   Highest education level: Not on file  Occupational History   Not on file  Tobacco Use   Smoking status: Never   Smokeless tobacco: Never  Vaping Use   Vaping status: Never Used  Substance and Sexual Activity   Alcohol use: Yes   Drug use: No   Sexual activity: Not on file  Other Topics Concern   Not on file  Social  History Narrative   Lives in [place]. 4 children    1 son in program at [facility] has autism, 109 years old, lives in home in [place]. Has dog and 3 cats.      Work - homemaker      Diet - regular diet      Exercise - walks daily, weights, trampoline   Social Drivers of Health   Financial Resource Strain: Low Risk  (01/02/2022)   Received from [facility], [facility]   Overall Financial Resource Strain (CARDIA)    Difficulty of Paying Living Expenses: Not hard at all  Food Insecurity: No Food Insecurity (01/02/2022)   Received from [facility], [facility]   Hunger Vital Sign    Worried About Running Out of Food in the Last Year: Never true    Ran Out of Food in the Last Year: Never true  Transportation Needs: No Transportation Needs (01/02/2022)   Received from [facility], [facility]   [facility] - Transportation    In the past 12 months, has lack of transportation kept you from medical appointments or from getting medications?: No    Lack of Transportation (Non-Medical): No  Physical Activity: Not on file  Stress:  Not on file  Social Connections: Not on file   Allergies  Allergen Reactions   Codeine Nausea Only   Family History  Problem Relation Age of Onset   Cancer Maternal Aunt        lung   Cancer Paternal Grandmother        breast   Epilepsy Son    Breast cancer Neg Hx        Current Outpatient Medications (Analgesics):    diclofenac (VOLTAREN) 75 MG EC tablet, TAKE 1 TABLET BY MOUTH TWICE DAILY  Current Outpatient Medications (Hematological):    cyanocobalamin (VITAMIN B12) 1000 MCG/ML injection, 1000 mcg (1 mL) intramuscular injection in the thigh ( vastus lateralis) once per week for four weeks, followed by 1000 mcg IM injection once per month x 6  Current Outpatient Medications (Other):    ALPRAZolam (XANAX) 0.25 MG tablet, Take 1 tablet (0.25 mg total)  by mouth daily as needed for anxiety.   bimatoprost (LATISSE) 0.03 % ophthalmic solution, PLACE 1 APPLICATION UNTO BOTH EYES AT BEDTIME.PLACE ONE DROP EVENLY ALONG THE SKIN OF THE UPPER EYELID AT THE BASE OF EYELASHES ONCE DAILY AT BEDTIME.REPEAT PROCEDURE FOR THE SECOND EYE(USE CLEAN APPLICATOR.   gabapentin (NEURONTIN) 300 MG capsule, TAKE 1 CAPSULE BY MOUTH AT BEDTIME   metaxalone (SKELAXIN) 800 MG tablet, Take 1 tablet (800 mg total) by mouth 2 (two) times daily as needed for muscle spasms.   triamcinolone (KENALOG) 0.025 % ointment, Apply to affected areas 1-2 times daily as needed.   zolpidem (AMBIEN) 10 MG tablet, Take 10 mg by mouth at bedtime as needed.   Reviewed prior external information including notes and imaging from  primary care provider As well as notes that were available from care everywhere and other healthcare systems.  Past medical history, social, surgical and family history all reviewed in electronic medical record.  No pertanent information unless stated regarding to the chief complaint.   Review of Systems:  No headache, visual changes, nausea, vomiting, diarrhea, constipation, dizziness, abdominal pain, skin rash, fevers, chills, night sweats, weight loss, swollen lymph nodes, body aches, joint swelling, chest pain, shortness of breath, mood changes. POSITIVE muscle aches  Objective  Last menstrual period 06/23/2014.   General: No apparent distress alert and oriented x3 mood and affect normal, dressed appropriately.  HEENT: Pupils equal, extraocular movements intact  Respiratory: Patient's speak in full sentences and does not appear short of breath  Cardiovascular: No lower extremity edema, non tender, no erythema  Foot and ankle exam shows tenderness to palpation in the ankle.  Midfoot also has severe tenderness noted.  Procedure: Real-time Ultrasound Guided Injection of left midfoot at the navicular cuneiform joint. Device: GE Logiq Q7 Ultrasound guided  injection is preferred based studies that show increased duration, increased effect, greater accuracy, decreased procedural pain, increased response rate, and decreased cost with ultrasound guided versus blind injection.  Verbal informed consent obtained.  Time-out conducted.  Noted no overlying erythema, induration, or other signs of local infection.  Skin prepped in a sterile fashion.  Local anesthesia: Topical Ethyl chloride.  With sterile technique and under real time ultrasound guidance: With a 25-gauge half inch needle injected with 0.5 cc of 0.5% Marcaine and 0.5 cc of Kenalog 40 mg/mL into the foot Completed without difficulty  Pain immediately resolved suggesting accurate placement of the medication.  Advised to call if fevers/chills, erythema, induration, drainage, or persistent bleeding.  Picture saved for review in patient's chart Impression: Technically successful ultrasound guided injection.  Procedure: Real-time Ultrasound Guided Injection of left ankle mortise Device: GE Logiq Q7 Ultrasound guided injection is preferred based studies that show increased duration, increased effect, greater accuracy, decreased procedural pain, increased response rate, and decreased cost with ultrasound guided versus blind injection.  Verbal informed consent obtained.  Time-out conducted.  Noted no overlying erythema, induration, or other signs of local infection.  Skin prepped in a sterile fashion.  Local anesthesia: Topical Ethyl chloride.  With sterile technique and under real time ultrasound guidance: With a 25-gauge 1 inch needle injected with 0.5 cc of 0.5% Marcaine and 1 cc of Kenalog 40 mg/mL into the ankle joint. Completed without difficulty  Pain immediately resolved suggesting accurate placement of the medication.  Picture saved for review in patient's chart Advised to call if fevers/chills, erythema, induration, drainage, or persistent bleeding.  Impression: Technically successful  ultrasound guided injection.   Impression and Recommendations:

## 2023-02-14 ENCOUNTER — Ambulatory Visit: Payer: Commercial Managed Care - HMO | Admitting: Family Medicine

## 2023-02-14 ENCOUNTER — Other Ambulatory Visit: Payer: Self-pay

## 2023-02-14 VITALS — BP 90/60 | HR 68 | Ht 62.0 in

## 2023-02-14 DIAGNOSIS — M25472 Effusion, left ankle: Secondary | ICD-10-CM | POA: Diagnosis not present

## 2023-02-14 DIAGNOSIS — G5732 Lesion of lateral popliteal nerve, left lower limb: Secondary | ICD-10-CM | POA: Diagnosis not present

## 2023-02-14 DIAGNOSIS — M19072 Primary osteoarthritis, left ankle and foot: Secondary | ICD-10-CM

## 2023-02-14 DIAGNOSIS — M79672 Pain in left foot: Secondary | ICD-10-CM

## 2023-02-14 NOTE — Patient Instructions (Addendum)
Good to see you  Injections in left foot today  Lets give it some time and we can consider the Perennial nerve

## 2023-02-16 ENCOUNTER — Encounter: Payer: Self-pay | Admitting: Family Medicine

## 2023-02-16 NOTE — Assessment & Plan Note (Signed)
Repeat injection given today, tolerated the procedure well, do think that this is potentially contributing to the discomfort and pain as well.  Discussed which activities to do and which ones to avoid.  Increase activity slowly otherwise.  Will continue to wear good shoes.  Still having the neuropathy potentially of the peroneal nerve and will consider injection in that area next time if needed.

## 2023-02-16 NOTE — Assessment & Plan Note (Signed)
Recurrent swelling and given injection.  Follow-up 2 months.  Discussed again the chronic difficulties that she has had.  Will monitor closely.

## 2023-02-16 NOTE — Assessment & Plan Note (Signed)
Do feel that patient made some improvement when we look on ultrasound.  Discussed avoiding certain activities.  Increase activity slowly.  Follow-up again in 2 months

## 2023-03-13 ENCOUNTER — Ambulatory Visit: Payer: Managed Care, Other (non HMO) | Admitting: Neurosurgery

## 2023-03-29 ENCOUNTER — Ambulatory Visit: Admitting: Family Medicine

## 2023-03-29 ENCOUNTER — Encounter: Payer: Self-pay | Admitting: Family Medicine

## 2023-03-29 ENCOUNTER — Other Ambulatory Visit: Payer: Self-pay

## 2023-03-29 VITALS — BP 102/68 | HR 70 | Ht 62.0 in | Wt 121.0 lb

## 2023-03-29 DIAGNOSIS — M2550 Pain in unspecified joint: Secondary | ICD-10-CM

## 2023-03-29 DIAGNOSIS — T63301A Toxic effect of unspecified spider venom, accidental (unintentional), initial encounter: Secondary | ICD-10-CM | POA: Diagnosis not present

## 2023-03-29 DIAGNOSIS — M79674 Pain in right toe(s): Secondary | ICD-10-CM

## 2023-03-29 MED ORDER — KETOROLAC TROMETHAMINE 60 MG/2ML IM SOLN
60.0000 mg | Freq: Once | INTRAMUSCULAR | Status: AC
  Administered 2023-03-29: 60 mg via INTRAMUSCULAR

## 2023-03-29 MED ORDER — DOXYCYCLINE HYCLATE 100 MG PO TABS: 100.0000 mg | ORAL_TABLET | Freq: Two times a day (BID) | ORAL | 0 refills | Status: DC

## 2023-03-29 MED ORDER — METHYLPREDNISOLONE ACETATE 80 MG/ML IJ SUSP
80.0000 mg | Freq: Once | INTRAMUSCULAR | Status: AC
  Administered 2023-03-29: 80 mg via INTRAMUSCULAR

## 2023-03-29 NOTE — Progress Notes (Signed)
 [name] [address] [place] [address] Phone: [phone] Subjective:   Jodi Anderson, am serving as a scribe for Dr. Antoine Primas.  I'm seeing this patient by the request  of:  Allegra Grana, FNP  CC: Right foot pain  [id number]  Jodi Anderson is a 63 y.o. female coming in with complaint of right foot. Started hurting on Wednesday out of nowhere. Pain on the bottom of foot sharp in nature. Took prescription anti inflammatory.      Past Medical History:  Diagnosis Date   Cardiac murmur    Palpitations    Past Surgical History:  Procedure Laterality Date   CESAREAN SECTION     3x   COLONOSCOPY  2016   Left Peroneal nerve decompression at the fibular neck Left 02/12/2022   Dr. Ernestine Mcmurray, done at [facility] FOR LEFT TOE FRACTURE     tummy tuck     Social History   Socioeconomic History   Marital status: Married    Spouse name: Not on file   Number of children: Not on file   Years of education: Not on file   Highest education level: Not on file  Occupational History   Not on file  Tobacco Use   Smoking status: Never   Smokeless tobacco: Never  Vaping Use   Vaping status: Never Used  Substance and Sexual Activity   Alcohol use: Yes   Drug use: No   Sexual activity: Not on file  Other Topics Concern   Not on file  Social History Narrative   Lives in [place]. 4 children    1 son in program at [facility] has autism, 37 years old, lives in home in [place]. Has dog and 3 cats.      Work - homemaker      Diet - regular diet      Exercise - walks daily, weights, trampoline   Social Drivers of Health   Financial Resource Strain: Low Risk  (01/02/2022)   Received from [facility], [facility]   Overall Financial Resource Strain (CARDIA)    Difficulty of Paying Living Expenses: Not hard at all  Food Insecurity: No Food Insecurity (01/02/2022)   Received from [facility], [facility]   Hunger Vital Sign    Worried About Running Out of Food in the Last Year: Never true    Ran Out of Food in the Last Year: Never true  Transportation Needs: No Transportation Needs (01/02/2022)   Received from [facility], [facility]   [facility]    In the past 12 months, has lack of transportation kept you from medical appointments or from getting medications?: No    Lack of Transportation (Non-Medical): No  Physical Activity: Not on file  Stress: Not on file  Social Connections: Not on file   Allergies  Allergen Reactions   Codeine Nausea Only   Family History  Problem Relation Age of Onset   Cancer Maternal Aunt        lung   Cancer Paternal Grandmother        breast   Epilepsy Son    Breast cancer Neg Hx        Current Outpatient Medications (Analgesics):    diclofenac (VOLTAREN) 75 MG EC tablet, TAKE 1 TABLET BY MOUTH TWICE DAILY  Current Outpatient Medications (Hematological):    cyanocobalamin (VITAMIN  B12) 1000 MCG/ML injection, 1000 mcg (1 mL) intramuscular injection in the thigh ( vastus lateralis) once per week for four weeks, followed by 1000 mcg IM injection once per month x 6  Current Outpatient Medications (Other):    doxycycline (VIBRA-TABS) 100 MG tablet, Take 1 tablet (100 mg total) by mouth 2 (two) times daily.   ALPRAZolam (XANAX) 0.25 MG tablet, Take 1 tablet (0.25 mg total) by mouth daily as needed for anxiety.   bimatoprost (LATISSE) 0.03 % ophthalmic solution, PLACE 1 APPLICATION UNTO BOTH EYES AT BEDTIME.PLACE ONE DROP EVENLY ALONG THE SKIN OF THE UPPER EYELID AT THE BASE OF EYELASHES ONCE DAILY AT BEDTIME.REPEAT PROCEDURE FOR THE SECOND EYE(USE CLEAN APPLICATOR.   gabapentin (NEURONTIN) 300 MG capsule, TAKE 1 CAPSULE BY MOUTH AT BEDTIME   metaxalone (SKELAXIN) 800 MG tablet, Take 1 tablet (800 mg total) by mouth 2 (two) times daily as needed for  muscle spasms.   triamcinolone (KENALOG) 0.025 % ointment, Apply to affected areas 1-2 times daily as needed.   zolpidem (AMBIEN) 10 MG tablet, Take 10 mg by mouth at bedtime as needed.   Reviewed prior external information including notes and imaging from  primary care provider As well as notes that were available from care everywhere and other healthcare systems.  Past medical history, social, surgical and family history all reviewed in electronic medical record.  No pertanent information unless stated regarding to the chief complaint.   Review of Systems:  No headache, visual changes, nausea, vomiting, diarrhea, constipation, dizziness, abdominal pain, skin rash, fevers, chills, night sweats, weight loss, swollen lymph nodes, body aches,, chest pain, shortness of breath, mood changes. POSITIVE muscle aches, joint swelling  Objective  Blood pressure 102/68, pulse 70, height 5\' 2"  (1.575 m), weight 121 lb (54.9 kg), last menstrual period 06/23/2014, SpO2 96%.   General: No apparent distress alert and oriented x3 mood and affect normal, dressed appropriately.  HEENT: Pupils equal, extraocular movements intact  Respiratory: Patient's speak in full sentences and does not appear short of breath  Cardiovascular: No lower extremity edema, non tender, no erythema  Right foot shows that the first toe is significantly inflamed and red.  This seems to be more cellulitic.  The patient has limited range of motion in all planes.  Does appear that there are 2 potential small puncture wounds on the medial inferior aspect of the first MTP   Limited muscular skeletal ultrasound was performed and interpreted by Antoine Primas, M   Limited ultrasound shows that there is some significant hypoechoic changes in the soft tissue of the first toe.  Patient does have some mild hypoechoic changes of the joint space but does not seem to be communicating to the soft tissue.  No inflammation of the underlying cortical  aspect of the bone. Impression: Likely cellulitis with small joint effusion   Impression and Recommendations:    The above documentation has been reviewed and is accurate and complete Judi Saa, DO

## 2023-03-29 NOTE — Assessment & Plan Note (Signed)
 Patient is a respiratory rate appears normal 2.  Does have a cellulitic change noted.  Doxycycline given to cover for an emergency.  Discussed icing regimen and home exercises, discussed which activities to do and which ones to avoid.  Concern that if any type of red streaking occurs or she needs to seek medical attention immediately.  Patient is in agreement with the plan and is already scheduled to see me in 2 weeks for other problems.

## 2023-03-29 NOTE — Patient Instructions (Addendum)
 Doxycycline for 10 days Full Cocktail given today. Red streaking go to urgent care or ER immediately

## 2023-04-08 ENCOUNTER — Ambulatory Visit: Payer: Commercial Managed Care - HMO | Admitting: Family

## 2023-04-08 ENCOUNTER — Encounter: Payer: Self-pay | Admitting: Family

## 2023-04-08 VITALS — BP 110/80 | HR 71 | Temp 98.2°F | Ht 62.0 in | Wt 123.4 lb

## 2023-04-08 DIAGNOSIS — G4700 Insomnia, unspecified: Secondary | ICD-10-CM

## 2023-04-08 MED ORDER — ZOLPIDEM TARTRATE 10 MG PO TABS: 10.0000 mg | ORAL_TABLET | Freq: Every evening | ORAL | 2 refills | Status: DC | PRN

## 2023-04-08 NOTE — Progress Notes (Unsigned)
   Assessment & Plan:  There are no diagnoses linked to this encounter.   Return precautions given.   Risks, benefits, and alternatives of the medications and treatment plan prescribed today were discussed, and patient expressed understanding.   Education regarding symptom management and diagnosis given to patient on AVS either electronically or printed.  No follow-ups on file.  Rennie Plowman, FNP  Subjective:    Patient ID: [name], female    DOB: 11-22-1960, 63 y.o.   MRN: [id number]  CC: LEIRA REGINO is a 63 y.o. female who presents today for follow up.   HPI: HPI  Allergies: Codeine Current Outpatient Medications on File Prior to Visit  Medication Sig Dispense Refill   bimatoprost (LATISSE) 0.03 % ophthalmic solution PLACE 1 APPLICATION UNTO BOTH EYES AT BEDTIME.PLACE ONE DROP EVENLY ALONG THE SKIN OF THE UPPER EYELID AT THE BASE OF EYELASHES ONCE DAILY AT BEDTIME.REPEAT PROCEDURE FOR THE SECOND EYE(USE CLEAN APPLICATOR. 3 mL 12   cyanocobalamin (VITAMIN B12) 1000 MCG/ML injection 1000 mcg (1 mL) intramuscular injection in the thigh ( vastus lateralis) once per week for four weeks, followed by 1000 mcg IM injection once per month x 6 1 mL 11   diclofenac (VOLTAREN) 75 MG EC tablet TAKE 1 TABLET BY MOUTH TWICE DAILY 180 tablet 1   doxycycline (VIBRA-TABS) 100 MG tablet Take 1 tablet (100 mg total) by mouth 2 (two) times daily. 20 tablet 0   gabapentin (NEURONTIN) 300 MG capsule TAKE 1 CAPSULE BY MOUTH AT BEDTIME 90 capsule 3   metaxalone (SKELAXIN) 800 MG tablet Take 1 tablet (800 mg total) by mouth 2 (two) times daily as needed for muscle spasms. 30 tablet 1   triamcinolone (KENALOG) 0.025 % ointment Apply to affected areas 1-2 times daily as needed. 80 g 1   zolpidem (AMBIEN) 10 MG tablet Take 10 mg by mouth at bedtime as needed.     ALPRAZolam (XANAX) 0.25 MG tablet Take 1 tablet (0.25 mg total) by mouth daily as needed for anxiety. (Patient not taking: Reported on  04/08/2023) 30 tablet 0   No current facility-administered medications on file prior to visit.    Review of Systems    Objective:    BP 110/80   Pulse 71   Temp 98.2 F (36.8 C) (Oral)   Ht 5\' 2"  (1.575 m)   Wt 123 lb 6.4 oz (56 kg)   LMP 06/23/2014 (Approximate)   SpO2 99%   BMI 22.57 kg/m  BP Readings from Last 3 Encounters:  04/08/23 110/80  03/29/23 102/68  02/14/23 90/60   Wt Readings from Last 3 Encounters:  04/08/23 123 lb 6.4 oz (56 kg)  03/29/23 121 lb (54.9 kg)  11/30/22 123 lb (55.8 kg)    Physical Exam

## 2023-04-10 NOTE — Assessment & Plan Note (Addendum)
 Chronic, stable.  Continue Ambien 10mg  ( 1/4 tablet) at bedtime prn.

## 2023-04-10 NOTE — Patient Instructions (Signed)
Nice to see you!   

## 2023-04-15 NOTE — Progress Notes (Unsigned)
 [name] [address] [place] [address] Phone: [phone] Subjective:   INadine Counts, am serving as a scribe for Dr. Antoine Primas.  I'm seeing this patient by the request  of:  Allegra Grana, FNP  CC: Toe pain and ankle pain follow-up  BJY:[id number]  03/29/2023 Doxycycline for 10 days Full Cocktail given today. Red streaking go to urgent care or ER immediately     Patient is a respiratory rate appears normal 2.  Does have a cellulitic change noted.  Doxycycline given to cover for an emergency.  Discussed icing regimen and home exercises, discussed which activities to do and which ones to avoid.  Concern that if any type of red streaking occurs or she needs to seek medical attention immediately.  Patient is in agreement with the plan and is already scheduled to see me in 2 weeks for other problems.     Updated 04/17/2023 MELE SYLVESTER is a 63 y.o. female coming in with complaint of toe pain, was found to have actually a spider bite on her right foot that caused a cellulitis.  Given doxycycline.  Patient states spider bite toe is doing so much better. Doing amazing. The other foot not doing the best.      Past Medical History:  Diagnosis Date   Cardiac murmur    Palpitations    Past Surgical History:  Procedure Laterality Date   CESAREAN SECTION     3x   COLONOSCOPY  2016   Left Peroneal nerve decompression at the fibular neck Left 02/12/2022   Dr. Ernestine Mcmurray, done at [facility] FOR LEFT TOE FRACTURE     tummy tuck     Social History   Socioeconomic History   Marital status: Married    Spouse name: Not on file   Number of children: Not on file   Years of education: Not on file   Highest education level: Not on file  Occupational History   Not on file  Tobacco Use   Smoking status: Never   Smokeless tobacco: Never  Vaping Use   Vaping status: Never Used  Substance and Sexual Activity   Alcohol use: Yes    Drug use: No   Sexual activity: Not on file  Other Topics Concern   Not on file  Social History Narrative   Lives in [place]. 4 children    1 son in program at [employer] has autism, 33 years old, lives in home in [facility]. Has dog and 3 cats.      Work - homemaker      Diet - regular diet      Exercise - walks daily, weights, trampoline   Social Drivers of Health   Financial Resource Strain: Low Risk  (01/02/2022)   Received from [facility], [facility]   Overall Financial Resource Strain (CARDIA)    Difficulty of Paying Living Expenses: Not hard at all  Food Insecurity: No Food Insecurity (01/02/2022)   Received from [facility], [facility]   Hunger Vital Sign    Worried About Running Out of Food in the Last Year: Never true    Ran Out of Food in the Last Year: Never true  Transportation Needs: No Transportation Needs (01/02/2022)   Received from [facility], [facility]   [facility] - Transportation    In the past 12 months, has lack of transportation kept  you from medical appointments or from getting medications?: No    Lack of Transportation (Non-Medical): No  Physical Activity: Not on file  Stress: Not on file  Social Connections: Not on file   Allergies  Allergen Reactions   Codeine Nausea Only   Family History  Problem Relation Age of Onset   Cancer Maternal Aunt        lung   Cancer Paternal Grandmother        breast   Epilepsy Son    Breast cancer Neg Hx        Current Outpatient Medications (Analgesics):    diclofenac (VOLTAREN) 75 MG EC tablet, TAKE 1 TABLET BY MOUTH TWICE DAILY  Current Outpatient Medications (Hematological):    cyanocobalamin (VITAMIN B12) 1000 MCG/ML injection, 1000 mcg (1 mL) intramuscular injection in the thigh ( vastus lateralis) once per week for four weeks, followed by 1000 mcg IM injection once per month x 6  Current  Outpatient Medications (Other):    ALPRAZolam (XANAX) 0.25 MG tablet, Take 1 tablet (0.25 mg total) by mouth daily as needed for anxiety. (Patient not taking: Reported on 04/08/2023)   bimatoprost (LATISSE) 0.03 % ophthalmic solution, PLACE 1 APPLICATION UNTO BOTH EYES AT BEDTIME.PLACE ONE DROP EVENLY ALONG THE SKIN OF THE UPPER EYELID AT THE BASE OF EYELASHES ONCE DAILY AT BEDTIME.REPEAT PROCEDURE FOR THE SECOND EYE(USE CLEAN APPLICATOR.   doxycycline (VIBRA-TABS) 100 MG tablet, Take 1 tablet (100 mg total) by mouth 2 (two) times daily.   gabapentin (NEURONTIN) 300 MG capsule, TAKE 1 CAPSULE BY MOUTH AT BEDTIME   metaxalone (SKELAXIN) 800 MG tablet, Take 1 tablet (800 mg total) by mouth 2 (two) times daily as needed for muscle spasms.   triamcinolone (KENALOG) 0.025 % ointment, Apply to affected areas 1-2 times daily as needed.   zolpidem (AMBIEN) 10 MG tablet, Take 1 tablet (10 mg total) by mouth at bedtime as needed.   Reviewed prior external information including notes and imaging from  primary care provider As well as notes that were available from care everywhere and other healthcare systems.  Past medical history, social, surgical and family history all reviewed in electronic medical record.  No pertanent information unless stated regarding to the chief complaint.   Review of Systems:  No headache, visual changes, nausea, vomiting, diarrhea, constipation, dizziness, abdominal pain, skin rash, fevers, chills, night sweats, weight loss, swollen lymph nodes, body aches, joint swelling, chest pain, shortness of breath, mood changes. POSITIVE muscle aches  Objective  Last menstrual period 06/23/2014.   General: No apparent distress alert and oriented x3 mood and affect normal, dressed appropriately.  HEENT: Pupils equal, extraocular movements intact  Respiratory: Patient's speak in full sentences and does not appear short of breath  Cardiovascular: No lower extremity edema, non tender, no  erythema  Foot exam shows patient does have the rigid midfoot noted. Ankle exam does have some tenderness to palpation noted.  Seems to be left greater than right.  Procedure: Real-time Ultrasound Guided Injection of left midfoot fourth and fifth intermetatarsal joint Device: GE Logiq Q7 Ultrasound guided injection is preferred based studies that show increased duration, increased effect, greater accuracy, decreased procedural pain, increased response rate, and decreased cost with ultrasound guided versus blind injection.  Verbal informed consent obtained.  Time-out conducted.  Noted no overlying erythema, induration, or other signs of local infection.  Skin prepped in a sterile fashion.  Local anesthesia: Topical Ethyl chloride.  With sterile technique and under real time ultrasound guidance:  With a 25-gauge half inch needle injected with 0.5 cc of 0.5% Marcaine and 0.5 cc of Kenalog 40 mg/mL Completed without difficulty  Pain immediately resolved suggesting accurate placement of the medication.  Advised to call if fevers/chills, erythema, induration, drainage, or persistent bleeding.  Images saved Impression: Technically successful ultrasound guided injection.  Procedure: Real-time Ultrasound Guided Injection of left ankle joint Device: GE Logiq Q7 Ultrasound guided injection is preferred based studies that show increased duration, increased effect, greater accuracy, decreased procedural pain, increased response rate, and decreased cost with ultrasound guided versus blind injection.  Verbal informed consent obtained.  Time-out conducted.  Noted no overlying erythema, induration, or other signs of local infection.  Skin prepped in a sterile fashion.  Local anesthesia: Topical Ethyl chloride.  With sterile technique and under real time ultrasound guidance: With a 25-gauge half inch needle injected with 0.5 cc of 0.5% Marcaine and 0.5 cc of Kenalog 40 mg/mL Completed without difficulty   Pain immediately resolved suggesting accurate placement of the medication.  Advised to call if fevers/chills, erythema, induration, drainage, or persistent bleeding.  Images saved Impression: Technically successful ultrasound guided injection.   Impression and Recommendations:     The above documentation has been reviewed and is accurate and complete Judi Saa, DO

## 2023-04-17 ENCOUNTER — Other Ambulatory Visit: Payer: Self-pay

## 2023-04-17 ENCOUNTER — Encounter: Payer: Self-pay | Admitting: Family Medicine

## 2023-04-17 ENCOUNTER — Ambulatory Visit: Payer: Commercial Managed Care - HMO | Admitting: Family Medicine

## 2023-04-17 VITALS — BP 114/78 | HR 64 | Ht 62.0 in

## 2023-04-17 DIAGNOSIS — M25472 Effusion, left ankle: Secondary | ICD-10-CM

## 2023-04-17 DIAGNOSIS — G5732 Lesion of lateral popliteal nerve, left lower limb: Secondary | ICD-10-CM | POA: Diagnosis not present

## 2023-04-17 DIAGNOSIS — M19072 Primary osteoarthritis, left ankle and foot: Secondary | ICD-10-CM

## 2023-04-17 DIAGNOSIS — M79674 Pain in right toe(s): Secondary | ICD-10-CM

## 2023-04-17 NOTE — Assessment & Plan Note (Signed)
 Will continue to monitor and may need to repeat injection if any more radicular symptoms to occur.

## 2023-04-17 NOTE — Assessment & Plan Note (Signed)
 Additional injection further laterally on the foot today.  Hopeful that this will make a difference.  Discussed icing regimen and home exercises.  Would like to space out the injections.  Patient is having daughter wedding in 6 weeks see me again in 12 weeks

## 2023-04-17 NOTE — Assessment & Plan Note (Signed)
 Repeat injection given again.  Discussed icing regimen and home exercises, which activities to do and which ones to avoid.  Increase activity slowly over the course of next several weeks.  Follow-up again in 6 to 8 weeks

## 2023-04-17 NOTE — Patient Instructions (Signed)
 Your daughters is going to have wonderful wedding Can get cocktail before if pain comes back See you again in  8 weeks

## 2023-05-01 ENCOUNTER — Ambulatory Visit: Payer: Managed Care, Other (non HMO) | Admitting: Neurosurgery

## 2023-05-03 ENCOUNTER — Ambulatory Visit: Payer: Self-pay

## 2023-05-03 NOTE — Telephone Encounter (Signed)
  Chief Complaint: Yellow film on tongue Symptoms: yellow film present Frequency: intermittently ongoing for 3 weeks  Pertinent Negatives: Patient denies fever, pain, swelling, redness, change in taste.  Disposition: [] ED /[] Urgent Care (no appt availability in office) / [] Appointment(In office/virtual)/ []  [place]/ [] Home Care/ [] Refused Recommended Disposition /[] [facility]/ [x]  Follow-up with PCP Additional Notes:  Jodi Anderson called her dental office this morning for yellow film on tongue and tech advised she follow up with PCP.  For approximately 3 weeks she has been developing yellow colored film on her tongue. She can scrape off yellow coating and tongue looks mostly normal when she scrapes away the film. Film keeps recurring after scraping away. She denies all other symptoms, no pain, no redness, no swelling, no changes to her taste. She reports she goes to the dentist regularly and has good oral hygiene. She does not smoke or eat foods she feels would contribute to this film build up. Does not feel she needs an appointment but willing to schedule for next week if pcp would like her to come in for evaluation or lab work. She plans to send a message through her EMR portal directly to her dentist to see if they have any advice or if dentist also recommending pcp as tech advised. Please follow up with [name] with recommendations, appointment not scheduled at this time pending recommendation. This writer advised to continue with good oral hygiene, frequent mouth rinsing, call back with new symptoms or questions.     Copied from CRM [id number]. Topic: Clinical - Red Word Triage >> May 03, 2023  2:32 PM Pascal Lux wrote: Red Word that prompted transfer to Nurse Triage: Patient stated she has a yellow tongue and yellow saliva when scraping teeth over tongue. Reason for Disposition  All other mouth symptoms (Exceptions: dry mouth from not drinking enough liquids, chapped  lips)  Protocols used: Mouth Symptoms-A-AH

## 2023-05-03 NOTE — Telephone Encounter (Signed)
 Scheduled pt for in office with Dr Charlann Lange on 05/06/23 @ 4 pm

## 2023-05-06 ENCOUNTER — Ambulatory Visit

## 2023-05-06 VITALS — BP 112/70 | HR 74 | Temp 98.7°F | Ht 62.0 in | Wt 117.0 lb

## 2023-05-06 DIAGNOSIS — K148 Other diseases of tongue: Secondary | ICD-10-CM | POA: Insufficient documentation

## 2023-05-06 MED ORDER — NYSTATIN 100000 UNIT/ML MT SUSP: 5.0000 mL | Freq: Four times a day (QID) | OROMUCOSAL | 0 refills | Status: DC

## 2023-05-06 NOTE — Progress Notes (Signed)
   Acute Office Visit  Subjective:    Patient ID: Jodi Anderson, female    DOB: 06-25-1960, 63 y.o.   MRN: [id number]  Chief Complaint  Patient presents with   yellow tent tongue     HPI Jodi Anderson is established patient to clinic presenting for following acute concern:  -Yellowish discoloration of tongue, first noticeable about 3 weeks ago. Patient reports her grandson noticed it when she was baby sitting him. She reports getting yellow color on paper towel when she wipes tongue. She denies OTC supplements, new food, tea, turmeric, food dyes,  cosmetic use. She was treated with Doxycycline in the beginning of March for cellulitis. She reports when she is stressed she gets aphthous ulcers. No pain, tinglings, swelling of tongue. She denies pruritus, abdominal pain, nausea, vomiting, sclera color change, pale stool, dark urine, fever, chills. Had dental procedure about 4 weeks ago. Patient reached out to her dentist who recommended she f/u with PCP.   She gets monthly vitamin B12 injection.   ROS As per HPI    Objective:    BP 112/70   Pulse 74   Temp 98.7 F (37.1 C) (Oral)   Ht 5\' 2"  (1.575 m)   Wt 117 lb (53.1 kg)   LMP 06/23/2014 (Approximate)   SpO2 98%   BMI 21.40 kg/m    Physical Exam Constitutional:      Appearance: Normal appearance.  HENT:     Head: Normocephalic and atraumatic.     Mouth/Throat:     Tongue: Tongue does not deviate from midline.     Palate: No mass.     Pharynx: Oropharynx is clear.     Comments: Single aphthous ulcer on right lateral tongue. No noticeable tongue discoloration noted today.   Eyes:     General: No scleral icterus. Cardiovascular:     Rate and Rhythm: Normal rate.  Pulmonary:     Effort: Pulmonary effort is normal.  Abdominal:     General: Abdomen is flat. Bowel sounds are normal. There is no distension.     Palpations: Abdomen is soft.     Tenderness: There is no abdominal tenderness. There is no guarding.  Neurological:      Mental Status: She is alert and oriented to person, place, and time.  Psychiatric:        Mood and Affect: Mood normal.     No results found for any visits on 05/06/23.     Assessment & Plan:  Tongue discoloration Assessment & Plan: Counsel patient on Oral hygiene.  Recommend a trial of Nystatin swish and swallow 4 times a day. Avoid eating or drinking for 30 minutes after use. We will obtain the following to r/o reversible causes including cholestasis, b12 deficiency - CBC w/Diff - Basic Metabolic Panel (BMET) - Hepatic function panel - B12    Orders: -     CBC with Differential/Platelet -     Basic metabolic panel with GFR -     Hepatic function panel -     Vitamin B12 -     Nystatin; Take 5 mLs (500,000 Units total) by mouth 4 (four) times daily.  Dispense: 60 mL; Refill: 0    Return in about 1 day (around 05/07/2023) for lab only appointment .  Skip Estimable, MD

## 2023-05-06 NOTE — Assessment & Plan Note (Addendum)
 Counsel patient on Oral hygiene.  Recommend a trial of Nystatin swish and swallow 4 times a day. Avoid eating or drinking for 30 minutes after use. We will obtain the following to r/o reversible causes including cholestasis, b12 deficiency - CBC w/Diff - Basic Metabolic Panel (BMET) - Hepatic function panel - B12

## 2023-05-07 ENCOUNTER — Other Ambulatory Visit (INDEPENDENT_AMBULATORY_CARE_PROVIDER_SITE_OTHER)

## 2023-05-07 DIAGNOSIS — K148 Other diseases of tongue: Secondary | ICD-10-CM | POA: Diagnosis not present

## 2023-05-08 ENCOUNTER — Telehealth: Payer: Self-pay

## 2023-05-08 LAB — CBC WITH DIFFERENTIAL/PLATELET
Basophils Absolute: 0 10*3/uL (ref 0.0–0.1)
Basophils Relative: 0.7 % (ref 0.0–3.0)
Eosinophils Absolute: 0.1 10*3/uL (ref 0.0–0.7)
Eosinophils Relative: 1.9 % (ref 0.0–5.0)
HCT: 39.6 % (ref 36.0–46.0)
Hemoglobin: 13.2 g/dL (ref 12.0–15.0)
Lymphocytes Relative: 25.4 % (ref 12.0–46.0)
Lymphs Abs: 1.5 10*3/uL (ref 0.7–4.0)
MCHC: 33.4 g/dL (ref 30.0–36.0)
MCV: 92 fl (ref 78.0–100.0)
Monocytes Absolute: 0.7 10*3/uL (ref 0.1–1.0)
Monocytes Relative: 12.8 % — ABNORMAL HIGH (ref 3.0–12.0)
Neutro Abs: 3.4 10*3/uL (ref 1.4–7.7)
Neutrophils Relative %: 59.2 % (ref 43.0–77.0)
Platelets: 258 10*3/uL (ref 150.0–400.0)
RBC: 4.3 Mil/uL (ref 3.87–5.11)
RDW: 13.5 % (ref 11.5–15.5)
WBC: 5.8 10*3/uL (ref 4.0–10.5)

## 2023-05-08 LAB — HEPATIC FUNCTION PANEL
ALT: 15 U/L (ref 0–35)
AST: 19 U/L (ref 0–37)
Albumin: 4.4 g/dL (ref 3.5–5.2)
Alkaline Phosphatase: 49 U/L (ref 39–117)
Bilirubin, Direct: 0.1 mg/dL (ref 0.0–0.3)
Total Bilirubin: 0.5 mg/dL (ref 0.2–1.2)
Total Protein: 6.4 g/dL (ref 6.0–8.3)

## 2023-05-08 LAB — BASIC METABOLIC PANEL WITH GFR
BUN: 22 mg/dL (ref 6–23)
CO2: 28 meq/L (ref 19–32)
Calcium: 9 mg/dL (ref 8.4–10.5)
Chloride: 103 meq/L (ref 96–112)
Creatinine, Ser: 0.97 mg/dL (ref 0.40–1.20)
GFR: 62.77 mL/min (ref >60.00)
Glucose, Bld: 94 mg/dL (ref 70–99)
Potassium: 3.8 meq/L (ref 3.5–5.1)
Sodium: 138 meq/L (ref 135–145)

## 2023-05-08 LAB — VITAMIN B12: Vitamin B-12: 467 pg/mL (ref 211–911)

## 2023-05-08 NOTE — Telephone Encounter (Signed)
Noted. Thanks Harvin Hazel!

## 2023-05-08 NOTE — Telephone Encounter (Signed)
 Copied from CRM [id number]. Topic: Clinical - Lab/Test Results >> May 08, 2023  3:26 PM Allyne Areola wrote: Reason for CRM: Patient is calling regarding lab results she received through MyChart, she has some concerns since she saw one of the results stated abnormal. I advised that once the provider has reviewed her results she will get a call or message through MyChart to review.

## 2023-05-14 ENCOUNTER — Telehealth: Payer: Self-pay

## 2023-05-14 NOTE — Telephone Encounter (Signed)
 Talked to the patient about mildly elevated monocytes. Has a h/o mild elevation in monocytes likely related to allergy in the presence of otherwise normal CBC. Recommend f/u with PCP as scheduled.  Jacklin Mascot, MD

## 2023-05-14 NOTE — Telephone Encounter (Signed)
 Copied from CRM [id number]. Topic: Clinical - Lab/Test Results >> May 08, 2023  3:26 PM Allyne Areola wrote: Reason for CRM: Patient is calling regarding lab results she received through MyChart, she has some concerns since she saw one of the results stated abnormal. I advised that once the provider has reviewed her results she will get a call or message through MyChart to review. >> May 14, 2023  9:57 AM Clyde Darling P wrote: PT called back because she has not received a call in regards to lab results- has a question about an abnormal test result. Pt will be unavailable from 12pm-2:45pm.

## 2023-05-14 NOTE — Telephone Encounter (Signed)
 Copied from CRM [id number]. Topic: Clinical - Lab/Test Results >> May 14, 2023  2:41 PM [name] wrote: Reason for CRM: Patient returning Jodi Anderson phone call please follow up with patient

## 2023-06-10 ENCOUNTER — Telehealth: Payer: Self-pay | Admitting: Family Medicine

## 2023-06-10 NOTE — Telephone Encounter (Signed)
 error

## 2023-06-11 NOTE — Progress Notes (Signed)
 [facility] [address] [place] [address] Phone: [phone] Subjective:   IBryan Caprio, am serving as a scribe for Dr. Ronnell Coins.  I'm seeing this patient by the request  of:  Calista Catching, FNP  CC:   [id number]  04/17/2023 Repeat injection given again.  Discussed icing regimen and home exercises, which activities to do and which ones to avoid.  Increase activity slowly over the course of next several weeks.  Follow-up again in 6 to 8 weeks     Additional injection further laterally on the foot today.  Hopeful that this will make a difference.  Discussed icing regimen and home exercises.  Would like to space out the injections.  Patient is having daughter wedding in 6 weeks see me again in 12 weeks      Update 06/12/2023 Jodi Anderson is a 63 y.o. female coming in with complaint of L ankle and foot pain. Patient states past week has been really bad. Something the neuro wanted him to do on lower leg. Wants to move forward with that. Wants to do everything she can before deciding to do surgery.       Past Medical History:  Diagnosis Date   Cardiac murmur    Palpitations    Past Surgical History:  Procedure Laterality Date   CESAREAN SECTION     3x   COLONOSCOPY  2016   Left Peroneal nerve decompression at the fibular neck Left 02/12/2022   Dr. Henderson Lock, done at [facility] FOR LEFT TOE FRACTURE     tummy tuck     Social History   Socioeconomic History   Marital status: Married    Spouse name: Not on file   Number of children: Not on file   Years of education: Not on file   Highest education level: Not on file  Occupational History   Not on file  Tobacco Use   Smoking status: Never   Smokeless tobacco: Never  Vaping Use   Vaping status: Never Used  Substance and Sexual Activity   Alcohol use: Yes   Drug use: No   Sexual activity: Not on file  Other Topics Concern   Not on file  Social History  Narrative   Lives in [place]. 4 children    1 son in program at [facility] has autism, 52 years old, lives in home in [facility] [facility]. Has dog and 3 cats.      Work - homemaker      Diet - regular diet      Exercise - walks daily, weights, trampoline   Social Drivers of Health   Financial Resource Strain: Low Risk  (01/02/2022)   Received from [facility], [facility]   Overall Financial Resource Strain (CARDIA)    Difficulty of Paying Living Expenses: Not hard at all  Food Insecurity: No Food Insecurity (01/02/2022)   Received from [facility], [facility]   Hunger Vital Sign    Worried About Running Out of Food in the Last Year: Never true    Ran Out of Food in the Last Year: Never true  Transportation Needs: No Transportation Needs (01/02/2022)   Received from [facility], [facility]   [facility] - Transportation    In the past 12 months, has lack of transportation kept you from medical appointments or from getting medications?: No    Lack of Transportation (  Non-Medical): No  Physical Activity: Not on file  Stress: Not on file  Social Connections: Not on file   Allergies  Allergen Reactions   Codeine Nausea Only   Family History  Problem Relation Age of Onset   Cancer Maternal Aunt        lung   Cancer Paternal Grandmother        breast   Epilepsy Son    Breast cancer Neg Hx        Current Outpatient Medications (Analgesics):    diclofenac  (VOLTAREN ) 75 MG EC tablet, TAKE 1 TABLET BY MOUTH TWICE DAILY  Current Outpatient Medications (Hematological):    cyanocobalamin  (VITAMIN B12) 1000 MCG/ML injection, 1000 mcg (1 mL) intramuscular injection in the thigh ( vastus lateralis) once per week for four weeks, followed by 1000 mcg IM injection once per month x 6  Current Outpatient Medications (Other):    ALPRAZolam  (XANAX ) 0.25 MG tablet, Take 1 tablet (0.25 mg total) by mouth  daily as needed for anxiety.   bimatoprost  (LATISSE ) 0.03 % ophthalmic solution, PLACE 1 APPLICATION UNTO BOTH EYES AT BEDTIME.PLACE ONE DROP EVENLY ALONG THE SKIN OF THE UPPER EYELID AT THE BASE OF EYELASHES ONCE DAILY AT BEDTIME.REPEAT PROCEDURE FOR THE SECOND EYE(USE CLEAN APPLICATOR.   doxycycline  (VIBRA -TABS) 100 MG tablet, Take 1 tablet (100 mg total) by mouth 2 (two) times daily. (Patient not taking: Reported on 05/06/2023)   gabapentin  (NEURONTIN ) 300 MG capsule, TAKE 1 CAPSULE BY MOUTH AT BEDTIME   metaxalone  (SKELAXIN ) 800 MG tablet, Take 1 tablet (800 mg total) by mouth 2 (two) times daily as needed for muscle spasms.   nystatin  (MYCOSTATIN ) 100000 UNIT/ML suspension, Take 5 mLs (500,000 Units total) by mouth 4 (four) times daily.   triamcinolone  (KENALOG ) 0.025 % ointment, Apply to affected areas 1-2 times daily as needed.   zolpidem  (AMBIEN ) 10 MG tablet, Take 1 tablet (10 mg total) by mouth at bedtime as needed.   Reviewed prior external information including notes and imaging from  primary care provider As well as notes that were available from care everywhere and other healthcare systems.  Past medical history, social, surgical and family history all reviewed in electronic medical record.  No pertanent information unless stated regarding to the chief complaint.   Review of Systems:  No headache, visual changes, nausea, vomiting, diarrhea, constipation, dizziness, abdominal pain, skin rash, fevers, chills, night sweats, weight loss, swollen lymph nodes, body aches, joint swelling, chest pain, shortness of breath, mood changes. POSITIVE muscle aches  Objective  Blood pressure 98/60, pulse 67, height 5\' 2"  (1.575 m), last menstrual period 06/23/2014, SpO2 98%.   General: No apparent distress alert and oriented x3 mood and affect normal, dressed appropriately.  HEENT: Pupils equal, extraocular movements intact  Respiratory: Patient's speak in full sentences and does not appear  short of breath  Cardiovascular: No lower extremity edema, non tender, no erythema  Left leg does not have any significant swelling but does have some atrophy noted of the lateral gastroc musculature.  Patient is tender to palpation approximately 4 cm proximal to the lateral malleolus.  Neurovascular intact distally.  Mild diffuse tenderness of the foot noted.   Procedure: Real-time Ultrasound Guided Injection of superficial peroneal nerve Device: GE Logiq Q7 Ultrasound guided injection is preferred based studies that show increased duration, increased effect, greater accuracy, decreased procedural pain, increased response rate, and decreased cost with ultrasound guided versus blind injection.  Verbal informed consent obtained.  Time-out conducted.  Noted no overlying  erythema, induration, or other signs of local infection.  Skin prepped in a sterile fashion.  Local anesthesia: Topical Ethyl chloride.  With sterile technique and under real time ultrasound guidance: With a 21-gauge 2 inch needle injected with 0.5 cc of 0.5% Marcaine and 1 cc of Kenalog  40 mg/mL use. Completed without difficulty  Pain immediately resolved suggesting accurate placement of the medication.  Advised to call if fevers/chills, erythema, induration, drainage, or persistent bleeding.  Images saved Impression: Technically successful ultrasound guided injection.   Impression and Recommendations:     The above documentation has been reviewed and is accurate and complete Audree Schrecengost M Bueford Arp, DO

## 2023-06-12 ENCOUNTER — Other Ambulatory Visit: Payer: Self-pay

## 2023-06-12 ENCOUNTER — Ambulatory Visit: Admitting: Family Medicine

## 2023-06-12 ENCOUNTER — Encounter: Payer: Self-pay | Admitting: Family Medicine

## 2023-06-12 VITALS — BP 98/60 | HR 67 | Ht 62.0 in

## 2023-06-12 DIAGNOSIS — G5732 Lesion of lateral popliteal nerve, left lower limb: Secondary | ICD-10-CM | POA: Diagnosis not present

## 2023-06-12 DIAGNOSIS — M79672 Pain in left foot: Secondary | ICD-10-CM

## 2023-06-12 NOTE — Patient Instructions (Addendum)
 Injection today Good to see you! See you again

## 2023-06-12 NOTE — Assessment & Plan Note (Signed)
 Attempted again with patient having some good resolution of pain.  This time did more on the superficial aspect ongoing more proximal than where we were previously.  Hopeful for some response.  We will see how patient does with this.  Discussed icing regimen and home exercises otherwise.  Likely no swelling in the midfoot or the ankle noted today.

## 2023-07-01 ENCOUNTER — Ambulatory Visit: Admitting: Dermatology

## 2023-07-01 ENCOUNTER — Encounter: Payer: Self-pay | Admitting: Dermatology

## 2023-07-01 DIAGNOSIS — L570 Actinic keratosis: Secondary | ICD-10-CM

## 2023-07-01 DIAGNOSIS — L820 Inflamed seborrheic keratosis: Secondary | ICD-10-CM

## 2023-07-01 DIAGNOSIS — D692 Other nonthrombocytopenic purpura: Secondary | ICD-10-CM

## 2023-07-01 DIAGNOSIS — L821 Other seborrheic keratosis: Secondary | ICD-10-CM | POA: Diagnosis not present

## 2023-07-01 DIAGNOSIS — W908XXA Exposure to other nonionizing radiation, initial encounter: Secondary | ICD-10-CM

## 2023-07-01 NOTE — Patient Instructions (Addendum)

## 2023-07-01 NOTE — Progress Notes (Signed)
 Follow-Up Visit   Subjective  Jodi Anderson is a 63 y.o. female who presents for the following: three places at faces. Patient states these places have been treated with LN2 in the past by Dr. Bary Likes, spot at L lower leg, R upper arm, and upper back.   The patient has spots, moles and lesions to be evaluated, some may be new or changing and the patient may have concern these could be cancer.   The following portions of the chart were reviewed this encounter and updated as appropriate: medications, allergies, medical history  Review of Systems:  No other skin or systemic complaints except as noted in HPI or Assessment and Plan.  Objective  Well appearing patient in no apparent distress; mood and affect are within normal limits.  A focused examination was performed of the following areas: Face, R arm, back, legs  Relevant exam findings are noted in the Assessment and Plan.  L nasal dorsum x1 Pink scaly macules L paraspinal upper back x1, R upper arm x1, R forehead x1 (3) Stuck on waxy paps with erythema  Assessment & Plan     Purpura - Chronic; persistent and recurrent.  Treatable, but not curable. - Violaceous macules and patches - Benign - Related to trauma, age, sun damage and/or use of blood thinners, chronic use of topical and/or oral steroids - Observe - Call for worsening or other concerns  SEBORRHEIC KERATOSIS - Stuck-on, waxy, tan-brown papules and/or plaques  - Benign-appearing - Discussed benign etiology and prognosis. - Observe - Call for any changes  AK (ACTINIC KERATOSIS) L nasal dorsum x1 Actinic keratoses are precancerous spots that appear secondary to cumulative UV radiation exposure/sun exposure over time. They are chronic with expected duration over 1 year. A portion of actinic keratoses will progress to squamous cell carcinoma of the skin. It is not possible to reliably predict which spots will progress to skin cancer and so treatment is  recommended to prevent development of skin cancer.  Recommend daily broad spectrum sunscreen SPF 30+ to sun-exposed areas, reapply every 2 hours as needed.  Recommend staying in the shade or wearing long sleeves, sun glasses (UVA+UVB protection) and wide brim hats (4-inch brim around the entire circumference of the hat). Call for new or changing lesions. Destruction of lesion - L nasal dorsum x1 Complexity: simple   Destruction method: cryotherapy   Informed consent: discussed and consent obtained   Timeout:  patient name, date of birth, surgical site, and procedure verified Lesion destroyed using liquid nitrogen: Yes   Region frozen until ice ball extended beyond lesion: Yes   Cryo cycles: 1 or 2. Outcome: patient tolerated procedure well with no complications   Post-procedure details: wound care instructions given   INFLAMED SEBORRHEIC KERATOSIS (3) L paraspinal upper back x1, R upper arm x1, R forehead x1 (3) Symptomatic, irritating, patient would like treated. Destruction of lesion - L paraspinal upper back x1, R upper arm x1, R forehead x1 (3) Complexity: simple   Destruction method: cryotherapy   Informed consent: discussed and consent obtained   Timeout:  patient name, date of birth, surgical site, and procedure verified Lesion destroyed using liquid nitrogen: Yes   Region frozen until ice ball extended beyond lesion: Yes   Cryo cycles: 1 or 2. Outcome: patient tolerated procedure well with no complications   Post-procedure details: wound care instructions given   SOLAR PURPURA (HCC)   SEBORRHEIC KERATOSES    Return if symptoms worsen or fail to improve.  I, Jacquelynn V. Grier Leber, CMA, am acting as scribe for Harris Liming, MD .   Documentation: I have reviewed the above documentation for accuracy and completeness, and I agree with the above.  Harris Liming, MD

## 2023-07-03 ENCOUNTER — Ambulatory Visit (INDEPENDENT_AMBULATORY_CARE_PROVIDER_SITE_OTHER): Admitting: Neurosurgery

## 2023-07-03 ENCOUNTER — Encounter: Payer: Self-pay | Admitting: Neurosurgery

## 2023-07-03 ENCOUNTER — Other Ambulatory Visit: Payer: Self-pay

## 2023-07-03 VITALS — BP 104/62 | Ht 62.0 in | Wt 117.0 lb

## 2023-07-03 DIAGNOSIS — G5732 Lesion of lateral popliteal nerve, left lower limb: Secondary | ICD-10-CM | POA: Diagnosis not present

## 2023-07-03 DIAGNOSIS — Z01818 Encounter for other preprocedural examination: Secondary | ICD-10-CM

## 2023-07-03 NOTE — Progress Notes (Signed)
 Referring Physician:  No referring provider defined for this encounter.  Primary Physician:  Calista Catching, FNP  History of Present Illness: 07/03/23  Jodi Anderson is here today with a chief complaint of history of left lower extremity peripheral neuropathy, peroneal neuropathy.  I have known her for significant time for my previous practice.  She has had a previous left-sided common peroneal decompression at the fibular neck, she had a good recovery but still had some superficial peroneal nerve symptoms.  She has had multiple injections, however she does feel like these are starting to wear off in efficacy.  She is here today to discuss decompression.    Review of Systems:  A 10 point review of systems is negative, except for the pertinent positives and negatives detailed in the HPI.  Past Medical History: Past Medical History:  Diagnosis Date   Cardiac murmur    Palpitations     Past Surgical History: Past Surgical History:  Procedure Laterality Date   CESAREAN SECTION     3x   COLONOSCOPY  2016   Left Peroneal nerve decompression at the fibular neck Left 02/12/2022   Dr. Henderson Lock, done at [facility] FOR LEFT TOE FRACTURE     tummy tuck      Allergies: Allergies as of 07/03/2023 - Review Complete 07/03/2023  Allergen Reaction Noted   Codeine Nausea Only 04/14/2013    Medications:  Current Outpatient Medications:    ALPRAZolam  (XANAX ) 0.25 MG tablet, Take 1 tablet (0.25 mg total) by mouth daily as needed for anxiety., Disp: 30 tablet, Rfl: 0   bimatoprost  (LATISSE ) 0.03 % ophthalmic solution, PLACE 1 APPLICATION UNTO BOTH EYES AT BEDTIME.PLACE ONE DROP EVENLY ALONG THE SKIN OF THE UPPER EYELID AT THE BASE OF EYELASHES ONCE DAILY AT BEDTIME.REPEAT PROCEDURE FOR THE SECOND EYE(USE CLEAN APPLICATOR., Disp: 3 mL, Rfl: 12   cyanocobalamin  (VITAMIN B12) 1000 MCG/ML injection, 1000 mcg (1 mL) intramuscular injection in the thigh ( vastus lateralis)  once per week for four weeks, followed by 1000 mcg IM injection once per month x 6, Disp: 1 mL, Rfl: 11   diclofenac  (VOLTAREN ) 75 MG EC tablet, TAKE 1 TABLET BY MOUTH TWICE DAILY, Disp: 180 tablet, Rfl: 1   doxycycline  (VIBRA -TABS) 100 MG tablet, Take 1 tablet (100 mg total) by mouth 2 (two) times daily., Disp: 20 tablet, Rfl: 0   gabapentin  (NEURONTIN ) 300 MG capsule, TAKE 1 CAPSULE BY MOUTH AT BEDTIME, Disp: 90 capsule, Rfl: 3   metaxalone  (SKELAXIN ) 800 MG tablet, Take 1 tablet (800 mg total) by mouth 2 (two) times daily as needed for muscle spasms., Disp: 30 tablet, Rfl: 1   nystatin  (MYCOSTATIN ) 100000 UNIT/ML suspension, Take 5 mLs (500,000 Units total) by mouth 4 (four) times daily., Disp: 60 mL, Rfl: 0   triamcinolone  (KENALOG ) 0.025 % ointment, Apply to affected areas 1-2 times daily as needed., Disp: 80 g, Rfl: 1   zolpidem  (AMBIEN ) 10 MG tablet, Take 1 tablet (10 mg total) by mouth at bedtime as needed., Disp: 30 tablet, Rfl: 2  Social History: Social History   Tobacco Use   Smoking status: Never   Smokeless tobacco: Never  Vaping Use   Vaping status: Never Used  Substance Use Topics   Alcohol use: Yes   Drug use: No    Family Medical History: Family History  Problem Relation Age of Onset   Cancer Maternal Aunt        lung   Cancer Paternal  Grandmother        breast   Epilepsy Son    Breast cancer Neg Hx     Physical Examination: Vitals:   07/03/23 1342  BP: 104/62    General: Patient is in no apparent distress. Attention to examination is appropriate.  Neck:   Supple.  Full range of motion.  Respiratory: Patient is breathing without any difficulty.   NEUROLOGICAL:     Awake, alert, oriented to person, place, and time.  Speech is clear and fluent.   Cranial Nerves: Pupils equal round and reactive to light.  Facial tone is symmetric. Shoulder shrug is symmetric. Tongue protrusion is midline.  There is no pronator drift.  Motor Exam:  She continues to  have good exertional strength in the lower extremity, no evidence of wasting.  Her incision is clean dry and intact.  When I saw her last she had a Tinel at the superficial peroneal exit site of the fascia on the lateral aspect of the fibular shaft, she continues to have this, it reproduces her symptoms.  Reflexes are nonpathologic  Some decrease sensation noted in the superficial peroneal nerve distribution.  Gait is normal.     Medical Decision Making  Imaging: No new imaging this time  Electrodiagnostics: No new electrodiagnostic testing at this time  I have personally reviewed the images and electrodiagnostics and agree with the above interpretation.  Assessment and Plan: Jodi Anderson is a pleasant 63 y.o. female with a history of a left-sided common peroneal neuropathy.  She underwent a decompression in January 2024.  She had a significant improvement in her left lower extremity pain.  She is now complaining of some symptoms more in her superficial peroneal nerve distribution.  She has had multiple superficial peroneal nerve injections for her superficial peroneal neuropathy.  These give her some relief, but often wear off within a few weeks.  She continues to have symptoms.  She is here today to discuss decompression.  On evaluation she continues to have a positive Tinel sign at the superficial peroneal nerve exit site of the fascia.  She does not have persistent robust recovery from injections, given these findings and her failure of conservative care we will plan for a left sided superficial peroneal nerve decompression at the fibular shaft.  Will do this with ultrasound guidance.  We discussed the risk and benefits of surgery including bleeding and infection, failure to improve her symptoms.  Should she not have any improvement from this decompression we would plan on having her referred to podiatry for more foot based therapy.  Thank you for involving me in the care of this patient.     Carroll Clamp MD/MSCR Neurosurgery - Peripheral Nerve Surgery

## 2023-07-03 NOTE — Addendum Note (Signed)
 Addended by: Ari Engelbrecht on: 07/03/2023 02:28 PM   Modules accepted: Orders

## 2023-07-03 NOTE — H&P (View-Only) (Signed)
 Referring Physician:  No referring provider defined for this encounter.  Primary Physician:  Calista Catching, FNP  History of Present Illness: 07/03/23  [name] is here today with a chief complaint of history of left lower extremity peripheral neuropathy, peroneal neuropathy.  I have known her for significant time for my previous practice.  She has had a previous left-sided common peroneal decompression at the fibular neck, she had a good recovery but still had some superficial peroneal nerve symptoms.  She has had multiple injections, however she does feel like these are starting to wear off in efficacy.  She is here today to discuss decompression.    Review of Systems:  A 10 point review of systems is negative, except for the pertinent positives and negatives detailed in the HPI.  Past Medical History: Past Medical History:  Diagnosis Date   Cardiac murmur    Palpitations     Past Surgical History: Past Surgical History:  Procedure Laterality Date   CESAREAN SECTION     3x   COLONOSCOPY  2016   Left Peroneal nerve decompression at the fibular neck Left 02/12/2022   Dr. Henderson Lock, done at [facility] FOR LEFT TOE FRACTURE     tummy tuck      Allergies: Allergies as of 07/03/2023 - Review Complete 07/03/2023  Allergen Reaction Noted   Codeine Nausea Only 04/14/2013    Medications:  Current Outpatient Medications:    ALPRAZolam  (XANAX ) 0.25 MG tablet, Take 1 tablet (0.25 mg total) by mouth daily as needed for anxiety., Disp: 30 tablet, Rfl: 0   bimatoprost  (LATISSE ) 0.03 % ophthalmic solution, PLACE 1 APPLICATION UNTO BOTH EYES AT BEDTIME.PLACE ONE DROP EVENLY ALONG THE SKIN OF THE UPPER EYELID AT THE BASE OF EYELASHES ONCE DAILY AT BEDTIME.REPEAT PROCEDURE FOR THE SECOND EYE(USE CLEAN APPLICATOR., Disp: 3 mL, Rfl: 12   cyanocobalamin  (VITAMIN B12) 1000 MCG/ML injection, 1000 mcg (1 mL) intramuscular injection in the thigh ( vastus lateralis)  once per week for four weeks, followed by 1000 mcg IM injection once per month x 6, Disp: 1 mL, Rfl: 11   diclofenac  (VOLTAREN ) 75 MG EC tablet, TAKE 1 TABLET BY MOUTH TWICE DAILY, Disp: 180 tablet, Rfl: 1   doxycycline  (VIBRA -TABS) 100 MG tablet, Take 1 tablet (100 mg total) by mouth 2 (two) times daily., Disp: 20 tablet, Rfl: 0   gabapentin  (NEURONTIN ) 300 MG capsule, TAKE 1 CAPSULE BY MOUTH AT BEDTIME, Disp: 90 capsule, Rfl: 3   metaxalone  (SKELAXIN ) 800 MG tablet, Take 1 tablet (800 mg total) by mouth 2 (two) times daily as needed for muscle spasms., Disp: 30 tablet, Rfl: 1   nystatin  (MYCOSTATIN ) 100000 UNIT/ML suspension, Take 5 mLs (500,000 Units total) by mouth 4 (four) times daily., Disp: 60 mL, Rfl: 0   triamcinolone  (KENALOG ) 0.025 % ointment, Apply to affected areas 1-2 times daily as needed., Disp: 80 g, Rfl: 1   zolpidem  (AMBIEN ) 10 MG tablet, Take 1 tablet (10 mg total) by mouth at bedtime as needed., Disp: 30 tablet, Rfl: 2  Social History: Social History   Tobacco Use   Smoking status: Never   Smokeless tobacco: Never  Vaping Use   Vaping status: Never Used  Substance Use Topics   Alcohol use: Yes   Drug use: No    Family Medical History: Family History  Problem Relation Age of Onset   Cancer Maternal Aunt        lung   Cancer Paternal  Grandmother        breast   Epilepsy Son    Breast cancer Neg Hx     Physical Examination: Vitals:   07/03/23 1342  BP: 104/62    General: Patient is in no apparent distress. Attention to examination is appropriate.  Neck:   Supple.  Full range of motion.  Respiratory: Patient is breathing without any difficulty.   NEUROLOGICAL:     Awake, alert, oriented to person, place, and time.  Speech is clear and fluent.   Cranial Nerves: Pupils equal round and reactive to light.  Facial tone is symmetric. Shoulder shrug is symmetric. Tongue protrusion is midline.  There is no pronator drift.  Motor Exam:  She continues to  have good exertional strength in the lower extremity, no evidence of wasting.  Her incision is clean dry and intact.  When I saw her last she had a Tinel at the superficial peroneal exit site of the fascia on the lateral aspect of the fibular shaft, she continues to have this, it reproduces her symptoms.  Reflexes are nonpathologic  Some decrease sensation noted in the superficial peroneal nerve distribution.  Gait is normal.     Medical Decision Making  Imaging: No new imaging this time  Electrodiagnostics: No new electrodiagnostic testing at this time  I have personally reviewed the images and electrodiagnostics and agree with the above interpretation.  Assessment and Plan: Jodi Anderson is a pleasant 63 y.o. female with a history of a left-sided common peroneal neuropathy.  She underwent a decompression in January 2024.  She had a significant improvement in her left lower extremity pain.  She is now complaining of some symptoms more in her superficial peroneal nerve distribution.  She has had multiple superficial peroneal nerve injections for her superficial peroneal neuropathy.  These give her some relief, but often wear off within a few weeks.  She continues to have symptoms.  She is here today to discuss decompression.  On evaluation she continues to have a positive Tinel sign at the superficial peroneal nerve exit site of the fascia.  She does not have persistent robust recovery from injections, given these findings and her failure of conservative care we will plan for a left sided superficial peroneal nerve decompression at the fibular shaft.  Will do this with ultrasound guidance.  We discussed the risk and benefits of surgery including bleeding and infection, failure to improve her symptoms.  Should she not have any improvement from this decompression we would plan on having her referred to podiatry for more foot based therapy.  Thank you for involving me in the care of this patient.     Carroll Clamp MD/MSCR Neurosurgery - Peripheral Nerve Surgery

## 2023-07-03 NOTE — Patient Instructions (Signed)
 Please see below for information in regards to your upcoming surgery:   Planned surgery: Left superficial peroneal nerve decompression at fibular shaft   Surgery date: 07/16/23 at [facility] (Medical Mall: [address] [place], [place] [address]) - you will find out your arrival time the business day before your surgery.   Pre-op appointment at [facility] Pre-admit Testing: you will receive a call with a date/time for this appointment. If you are scheduled for an in person appointment, Pre-admit Testing is located on the first floor of the Medical Arts building, [address], [address]. During this appointment, they will advise you which medications you can take the morning of surgery, and which medications you will need to hold for surgery. Labs (such as blood work, EKG) may be done at your pre-op appointment. You are not required to fast for these labs. Should you need to change your pre-op appointment, please call Pre-admit testing at [phone].     How to contact us :  If you have any questions/concerns before or after surgery, you can reach us  at [phone], or you can send a mychart message. We can be reached by phone or mychart 8am-4pm, Monday-Friday.  *Please note: Calls after 4pm are forwarded to a third party answering service. Mychart messages are not routinely monitored during evenings, weekends, and holidays. Please call our office to contact the answering service for urgent concerns during non-business hours.    If you have FMLA/disability paperwork, please drop it off or fax it to [phone], attention Patty.   Appointments/FMLA & disability paperwork: Gerlean Kocher, & Maryann Smalls Registered Nurses/Surgery schedulers: Sianni Cloninger & Lauren Medical Assistants: Donnajean Fuse Physician Assistants: Ludwig Safer, PA-C, Anastacio Karvonen, PA-C & Lucetta Russel, PA-C Surgeons: Jodeen Munch, MD & Henderson Lock, MD   [facility] PREADMIT TESTING VISIT and SURGERY INFORMATION SHEET   Now that surgery has been scheduled you can anticipate several phone calls from [facility] services. A pharmacy technician will call you to verify your current list of medications taken at home.               The Pre-Service Center will call to verify your insurance information and to give you billing estimates and information.             The Preadmit Testing Office will be calling to schedule a visit to obtain information for the anesthesia team and provide instructions on preparation for surgery.  What can you expect for the Preadmit Testing Visit: Appointments may be scheduled in-person or by telephone.  If a telephone visit is scheduled, you may be asked to come into the office to have lab tests or other studies performed.   This visit will not be completed any greater than 14 days prior to your surgery.  If your surgery has been scheduled for a future date, please do not be alarmed if we have not contacted you to schedule an appointment more than a month prior to the surgery date.    Please be prepared to provide the following information during this appointment:            -Personal medical history                                               -Medication and allergy list            -  Any history of problems with anesthesia              -Recent lab work or diagnostic studies            -Please notify us  of any needs we should be aware of to provide the best care possible           -You will be provided with instructions on how to prepare for your surgery.    On The Day of Surgery:  You must have a driver to take you home after surgery, you will be asked not to drive for 24 hours following surgery.  Taxi, [employer] and non-medical transport will not be acceptable means of transportation unless you have a responsible individual who will be traveling with you.  Visitors in the surgical area:   2 people will be able to visit  you in your room once your preparation for surgery has been completed. During surgery, your visitors will be asked to wait in the Surgery Waiting Area.  It is not a requirement for them to stay, if they prefer to leave and come back.  Your visitor(s) will be given an update once the surgery has been completed.  No visitors are allowed in the initial recovery room to respect patient privacy and safety.  Once you are more awake and transfer to the secondary recovery area, or are transferred to an inpatient room, visitors will again be able to see you.  To respect and protect your privacy: We will ask on the day of surgery who your driver will be and what the contact number for that individual will be. We will ask if it is okay to share information with this individual, or if there is an alternative individual that we, or the surgeon, should contact to provide updates and information. If family or friends come to the surgical information desk requesting information about you, who you have not listed with us , no information will be given.   It may be helpful to designate someone as the main contact who will be responsible for updating your other friends and family.    PREADMIT TESTING OFFICE: [phone] SAME DAY SURGERY: [phone] We look forward to caring for you before and throughout the process of your surgery.

## 2023-07-10 ENCOUNTER — Other Ambulatory Visit: Payer: Self-pay

## 2023-07-10 ENCOUNTER — Encounter
Admission: RE | Admit: 2023-07-10 | Discharge: 2023-07-10 | Disposition: A | Discharge status: Home / Self Care | Source: Ambulatory Visit | Attending: Neurosurgery | Admitting: Neurosurgery

## 2023-07-10 DIAGNOSIS — R002 Palpitations: Secondary | ICD-10-CM

## 2023-07-10 DIAGNOSIS — R0602 Shortness of breath: Secondary | ICD-10-CM

## 2023-07-10 DIAGNOSIS — I4949 Other premature depolarization: Secondary | ICD-10-CM

## 2023-07-10 HISTORY — DX: Anxiety disorder, unspecified: F41.9

## 2023-07-10 HISTORY — DX: Other specified postprocedural states: Z98.890

## 2023-07-10 NOTE — Patient Instructions (Addendum)
 Your procedure is scheduled on: 07/16/23 - Tuesday Report to the Registration Desk on the 1st floor of the Medical Mall. To find out your arrival time, please call [phone] between 1PM - 3PM on: 07/15/23 - Monday If your arrival time is 6:00 am, do not arrive before that time as the Medical Mall entrance doors do not open until 6:00 am.  REMEMBER: Instructions that are not followed completely may result in serious medical risk, up to and including death; or upon the discretion of your surgeon and anesthesiologist your surgery may need to be rescheduled.  Do not eat food after midnight the night before surgery.  No gum chewing or hard candies.  You may however, drink CLEAR liquids up to 2 hours before you are scheduled to arrive for your surgery. Do not drink anything within 2 hours of your scheduled arrival time.  Clear liquids include: - water  - apple juice without pulp - gatorade (not RED colors) - black coffee or tea (Do NOT add milk or creamers to the coffee or tea) Do NOT drink anything that is not on this list.  One week prior to surgery: Stop Anti-inflammatories (NSAIDS) such as diclofenac  (VOLTAREN ) ,Advil, Aleve, Ibuprofen, Motrin, Naproxen, Naprosyn and Aspirin based products such as Excedrin, Goody's Powder, BC Powder. You may take Tylenol if needed for pain up until the day of surgery.  Stop ANY OVER THE COUNTER supplements until after surgery.  Continue taking all of your other prescription medications up until the day before surgery.  ON THE DAY OF SURGERY ONLY TAKE THESE MEDICATIONS WITH SIPS OF WATER:  none   No Alcohol for 24 hours before or after surgery.  No Smoking including e-cigarettes for 24 hours before surgery.  No chewable tobacco products for at least 6 hours before surgery.  No nicotine patches on the day of surgery.  Do not use any recreational drugs for at least a week (preferably 2 weeks) before your surgery.  Please be advised that the  combination of cocaine and anesthesia may have negative outcomes, up to and including death. If you test positive for cocaine, your surgery will be cancelled.  On the morning of surgery brush your teeth with toothpaste and water, you may rinse your mouth with mouthwash if you wish. Do not swallow any toothpaste or mouthwash.  Use CHG Soap or wipes as directed on instruction sheet.  Do not wear jewelry, make-up, hairpins, clips or nail polish.  For welded (permanent) jewelry: bracelets, anklets, waist bands, etc.  Please have this removed prior to surgery.  If it is not removed, there is a chance that hospital personnel will need to cut it off on the day of surgery.  Do not wear lotions, powders, or perfumes.   Do not shave body hair from the neck down 48 hours before surgery.  Contact lenses, hearing aids and dentures may not be worn into surgery.  Do not bring valuables to the hospital. [facility] is not responsible for any missing/lost belongings or valuables.   Notify your doctor if there is any change in your medical condition (cold, fever, infection).  Wear comfortable clothing (specific to your surgery type) to the hospital.  After surgery, you can help prevent lung complications by doing breathing exercises.  Take deep breaths and cough every 1-2 hours. Your doctor may order a device called an Incentive Spirometer to help you take deep breaths.  When coughing or sneezing, hold a pillow firmly against your incision with both hands.  This is called "splinting." Doing this helps protect your incision. It also decreases belly discomfort.  If you are being admitted to the hospital overnight, leave your suitcase in the car. After surgery it may be brought to your room.  In case of increased patient census, it may be necessary for you, the patient, to continue your postoperative care in the Same Day Surgery department.  If you are being discharged the day of surgery, you will not be  allowed to drive home. You will need a responsible individual to drive you home and stay with you for 24 hours after surgery.   If you are taking public transportation, you will need to have a responsible individual with you.  Please call the Pre-admissions Testing Dept. at [phone] if you have any questions about these instructions.  Surgery Visitation Policy:  Patients having surgery or a procedure may have two visitors.  Children under the age of 20 must have an adult with them who is not the patient.  Inpatient Visitation:    Visiting hours are 7 a.m. to 8 p.m. Up to four visitors are allowed at one time in a patient room. The visitors may rotate out with other people during the day.  One visitor age 57 or older may stay with the patient overnight and must be in the room by 8 p.m.    Preparing for Surgery with CHLORHEXIDINE GLUCONATE (CHG) Soap  Chlorhexidine Gluconate (CHG) Soap  o An antiseptic cleaner that kills germs and bonds with the skin to continue killing germs even after washing  o Used for showering the night before surgery and morning of surgery  Before surgery, you can play an important role by reducing the number of germs on your skin.  CHG (Chlorhexidine gluconate) soap is an antiseptic cleanser which kills germs and bonds with the skin to continue killing germs even after washing.  Please do not use if you have an allergy to CHG or antibacterial soaps. If your skin becomes reddened/irritated stop using the CHG.  1. Shower the NIGHT BEFORE SURGERY and the MORNING OF SURGERY with CHG soap.  2. If you choose to wash your hair, wash your hair first as usual with your normal shampoo.  3. After shampooing, rinse your hair and body thoroughly to remove the shampoo.  4. Use CHG as you would any other liquid soap. You can apply CHG directly to the skin and wash gently with a scrungie or a clean washcloth.  5. Apply the CHG soap to your body only from the neck  down. Do not use on open wounds or open sores. Avoid contact with your eyes, ears, mouth, and genitals (private parts). Wash face and genitals (private parts) with your normal soap.  6. Wash thoroughly, paying special attention to the area where your surgery will be performed.  7. Thoroughly rinse your body with warm water.  8. Do not shower/wash with your normal soap after using and rinsing off the CHG soap.  9. Pat yourself dry with a clean towel.  10. Wear clean pajamas to bed the night before surgery.  12. Place clean sheets on your bed the night of your first shower and do not sleep with pets.  13. Shower again with the CHG soap on the day of surgery prior to arriving at the hospital.  14. Do not apply any deodorants/lotions/powders.  15. Please wear clean clothes to the hospital.

## 2023-07-11 ENCOUNTER — Telehealth: Payer: Self-pay | Admitting: Family Medicine

## 2023-07-11 NOTE — Telephone Encounter (Signed)
 Patient called and wanted Dr. Felipe Horton to know that the neurosurgeon in [place] (Dr. Felipe Horton) is going to do the lower surgery on Tuesday. She has cancelled appointment here for next week. FYI

## 2023-07-15 ENCOUNTER — Encounter
Admission: RE | Admit: 2023-07-15 | Discharge: 2023-07-15 | Disposition: A | Discharge status: Home / Self Care | Source: Ambulatory Visit | Attending: Acute Care | Admitting: Acute Care

## 2023-07-15 DIAGNOSIS — Z01810 Encounter for preprocedural cardiovascular examination: Secondary | ICD-10-CM | POA: Diagnosis not present

## 2023-07-15 DIAGNOSIS — R002 Palpitations: Secondary | ICD-10-CM | POA: Diagnosis not present

## 2023-07-15 DIAGNOSIS — R9431 Abnormal electrocardiogram [ECG] [EKG]: Secondary | ICD-10-CM | POA: Diagnosis not present

## 2023-07-15 DIAGNOSIS — I4949 Other premature depolarization: Secondary | ICD-10-CM | POA: Insufficient documentation

## 2023-07-15 DIAGNOSIS — Z01818 Encounter for other preprocedural examination: Secondary | ICD-10-CM | POA: Diagnosis present

## 2023-07-15 DIAGNOSIS — R0602 Shortness of breath: Secondary | ICD-10-CM | POA: Insufficient documentation

## 2023-07-16 ENCOUNTER — Ambulatory Visit: Payer: Self-pay | Admitting: Urgent Care

## 2023-07-16 ENCOUNTER — Other Ambulatory Visit: Payer: Self-pay

## 2023-07-16 ENCOUNTER — Encounter
Admission: RE | Disposition: A | Discharge status: Home / Self Care | Payer: Self-pay | Source: Home / Self Care | Attending: Neurosurgery

## 2023-07-16 ENCOUNTER — Ambulatory Visit
Admission: RE | Admit: 2023-07-16 | Discharge: 2023-07-16 | Disposition: A | Discharge status: Home / Self Care | Attending: Neurosurgery | Admitting: Neurosurgery

## 2023-07-16 ENCOUNTER — Encounter: Payer: Self-pay | Admitting: Neurosurgery

## 2023-07-16 DIAGNOSIS — G5732 Lesion of lateral popliteal nerve, left lower limb: Secondary | ICD-10-CM | POA: Insufficient documentation

## 2023-07-16 DIAGNOSIS — Z01818 Encounter for other preprocedural examination: Secondary | ICD-10-CM

## 2023-07-16 HISTORY — PX: PERONEAL NERVE DECOMPRESSION: SHX2226

## 2023-07-16 SURGERY — PERONEAL NERVE DECOMPRESSION
Anesthesia: General | Site: Leg Lower | Laterality: Left

## 2023-07-16 MED ORDER — BUPIVACAINE-EPINEPHRINE (PF) 0.5% -1:200000 IJ SOLN
INTRAMUSCULAR | Status: DC | PRN
  Administered 2023-07-16: 8 mL

## 2023-07-16 MED ORDER — DROPERIDOL 2.5 MG/ML IJ SOLN: 0.6250 mg | Freq: Once | INTRAMUSCULAR | Status: DC | PRN

## 2023-07-16 MED ORDER — FENTANYL CITRATE (PF) 100 MCG/2ML IJ SOLN
INTRAMUSCULAR | Status: AC
Start: 2023-07-16 — End: 2023-07-16
  Filled 2023-07-16: qty 2

## 2023-07-16 MED ORDER — BUPIVACAINE-EPINEPHRINE (PF) 0.5% -1:200000 IJ SOLN
INTRAMUSCULAR | Status: AC
  Filled 2023-07-16: qty 30

## 2023-07-16 MED ORDER — ACETAMINOPHEN 500 MG PO TABS
1000.0000 mg | ORAL_TABLET | Freq: Once | ORAL | Status: AC
  Administered 2023-07-16: 1000 mg via ORAL

## 2023-07-16 MED ORDER — ORAL CARE MOUTH RINSE: 15.0000 mL | Freq: Once | OROMUCOSAL | Status: AC

## 2023-07-16 MED ORDER — MIDAZOLAM HCL 2 MG/2ML IJ SOLN
INTRAMUSCULAR | Status: AC
Start: 2023-07-16 — End: 2023-07-16
  Filled 2023-07-16: qty 2

## 2023-07-16 MED ORDER — LACTATED RINGERS IV SOLN: INTRAVENOUS | Status: DC

## 2023-07-16 MED ORDER — ONDANSETRON HCL 4 MG/2ML IJ SOLN
INTRAMUSCULAR | Status: AC
  Filled 2023-07-16: qty 2

## 2023-07-16 MED ORDER — DEXAMETHASONE SODIUM PHOSPHATE 4 MG/ML IJ SOLN
INTRAMUSCULAR | Status: DC | PRN
  Administered 2023-07-16: 10 mg via INTRAVENOUS

## 2023-07-16 MED ORDER — CEFAZOLIN IN SODIUM CHLORIDE 2-0.9 GM/100ML-% IV SOLN
2.0000 g | Freq: Once | INTRAVENOUS | Status: DC
  Filled 2023-07-16: qty 100

## 2023-07-16 MED ORDER — METHYLPREDNISOLONE ACETATE 40 MG/ML IJ SUSP
INTRAMUSCULAR | Status: DC | PRN
  Administered 2023-07-16: 40 mg

## 2023-07-16 MED ORDER — OXYCODONE HCL 5 MG PO TABS: 5.0000 mg | ORAL_TABLET | Freq: Once | ORAL | Status: DC | PRN

## 2023-07-16 MED ORDER — METHYLPREDNISOLONE ACETATE 40 MG/ML IJ SUSP
INTRAMUSCULAR | Status: AC
Start: 2023-07-16 — End: 2023-07-16
  Filled 2023-07-16: qty 1

## 2023-07-16 MED ORDER — FENTANYL CITRATE (PF) 100 MCG/2ML IJ SOLN
INTRAMUSCULAR | Status: DC | PRN
  Administered 2023-07-16: 25 ug via INTRAVENOUS

## 2023-07-16 MED ORDER — ACETAMINOPHEN 10 MG/ML IV SOLN
1000.0000 mg | Freq: Once | INTRAVENOUS | Status: DC | PRN
Start: 2023-07-16 — End: 2023-07-16

## 2023-07-16 MED ORDER — FENTANYL CITRATE (PF) 100 MCG/2ML IJ SOLN: 25.0000 ug | INTRAMUSCULAR | Status: DC | PRN

## 2023-07-16 MED ORDER — 0.9 % SODIUM CHLORIDE (POUR BTL) OPTIME
TOPICAL | Status: DC | PRN
  Administered 2023-07-16: 500 mL

## 2023-07-16 MED ORDER — PROPOFOL 500 MG/50ML IV EMUL
INTRAVENOUS | Status: DC | PRN
  Administered 2023-07-16: 40 mg via INTRAVENOUS
  Administered 2023-07-16: 75 ug/kg/min via INTRAVENOUS

## 2023-07-16 MED ORDER — GLYCOPYRROLATE 0.2 MG/ML IJ SOLN
INTRAMUSCULAR | Status: DC | PRN
  Administered 2023-07-16: .2 mg via INTRAVENOUS

## 2023-07-16 MED ORDER — OXYCODONE HCL 5 MG/5ML PO SOLN: 5.0000 mg | Freq: Once | ORAL | Status: DC | PRN

## 2023-07-16 MED ORDER — LIDOCAINE HCL (CARDIAC) PF 100 MG/5ML IV SOSY
PREFILLED_SYRINGE | INTRAVENOUS | Status: DC | PRN
  Administered 2023-07-16: 60 mg via INTRAVENOUS

## 2023-07-16 MED ORDER — CELECOXIB 200 MG PO CAPS
200.0000 mg | ORAL_CAPSULE | Freq: Once | ORAL | Status: AC
  Administered 2023-07-16: 200 mg via ORAL

## 2023-07-16 MED ORDER — DEXAMETHASONE SODIUM PHOSPHATE 10 MG/ML IJ SOLN
INTRAMUSCULAR | Status: AC
Start: 2023-07-16 — End: 2023-07-16
  Filled 2023-07-16: qty 1

## 2023-07-16 MED ORDER — MIDAZOLAM HCL 2 MG/2ML IJ SOLN
INTRAMUSCULAR | Status: DC | PRN
  Administered 2023-07-16: 2 mg via INTRAVENOUS

## 2023-07-16 MED ORDER — PHENYLEPHRINE 80 MCG/ML (10ML) SYRINGE FOR IV PUSH (FOR BLOOD PRESSURE SUPPORT)
PREFILLED_SYRINGE | INTRAVENOUS | Status: DC | PRN
  Administered 2023-07-16 (×2): 80 ug via INTRAVENOUS

## 2023-07-16 MED ORDER — PROPOFOL 10 MG/ML IV BOLUS
INTRAVENOUS | Status: AC
  Filled 2023-07-16: qty 20

## 2023-07-16 MED ORDER — TRAMADOL HCL 50 MG PO TABS: 50.0000 mg | ORAL_TABLET | Freq: Four times a day (QID) | ORAL | 0 refills | Status: AC | PRN

## 2023-07-16 MED ORDER — DOCUSATE SODIUM 100 MG PO CAPS: 100.0000 mg | ORAL_CAPSULE | Freq: Two times a day (BID) | ORAL | 0 refills | Status: AC

## 2023-07-16 MED ORDER — CHLORHEXIDINE GLUCONATE 0.12 % MT SOLN
OROMUCOSAL | Status: AC
Start: 2023-07-16 — End: 2023-07-16
  Filled 2023-07-16: qty 15

## 2023-07-16 MED ORDER — ACETAMINOPHEN 500 MG PO TABS
ORAL_TABLET | ORAL | Status: AC
  Filled 2023-07-16: qty 2

## 2023-07-16 MED ORDER — CELECOXIB 200 MG PO CAPS
ORAL_CAPSULE | ORAL | Status: AC
  Filled 2023-07-16: qty 1

## 2023-07-16 MED ORDER — CHLORHEXIDINE GLUCONATE 0.12 % MT SOLN
15.0000 mL | Freq: Once | OROMUCOSAL | Status: AC
  Administered 2023-07-16: 15 mL via OROMUCOSAL

## 2023-07-16 MED ORDER — CEFAZOLIN SODIUM-DEXTROSE 2-4 GM/100ML-% IV SOLN
INTRAVENOUS | Status: AC
  Filled 2023-07-16: qty 100

## 2023-07-16 MED ORDER — ONDANSETRON HCL 4 MG/2ML IJ SOLN
INTRAMUSCULAR | Status: DC | PRN
Start: 2023-07-16 — End: 2023-07-16
  Administered 2023-07-16: 4 mg via INTRAVENOUS

## 2023-07-16 MED ORDER — LIDOCAINE HCL (PF) 2 % IJ SOLN
INTRAMUSCULAR | Status: AC
  Filled 2023-07-16: qty 5

## 2023-07-16 SURGICAL SUPPLY — 28 items
BRUSH SCRUB EZ 4% CHG (MISCELLANEOUS) ×2 IMPLANT
CHLORAPREP W/TINT 26 (MISCELLANEOUS) ×2 IMPLANT
CORD BIP STRL DISP 12FT (MISCELLANEOUS) ×2 IMPLANT
COVER PROBE FLX POLY STRL (MISCELLANEOUS) IMPLANT
DERMABOND ADVANCED .7 DNX12 (GAUZE/BANDAGES/DRESSINGS) ×2 IMPLANT
DRAPE INCISE IOBAN 66X45 STRL (DRAPES) ×2 IMPLANT
DRAPE LAPAROTOMY 77X122 PED (DRAPES) ×2 IMPLANT
DRAPE SURG 17X11 SM STRL (DRAPES) ×4 IMPLANT
DRSG OPSITE POSTOP 3X4 (GAUZE/BANDAGES/DRESSINGS) ×2 IMPLANT
FORCEPS JEWEL BIP 4-3/4 STR (INSTRUMENTS) ×2 IMPLANT
GAUZE SPONGE 4X4 12PLY STRL (GAUZE/BANDAGES/DRESSINGS) ×2 IMPLANT
GAUZE XEROFORM 1X8 LF (GAUZE/BANDAGES/DRESSINGS) ×2 IMPLANT
GLOVE BIOGEL PI IND STRL 8 (GLOVE) ×4 IMPLANT
GLOVE SURG SYN 7.5 E (GLOVE) ×1 IMPLANT
GLOVE SURG SYN 7.5 PF PI (GLOVE) ×2 IMPLANT
GOWN STRL REUS W/ TWL LRG LVL3 (GOWN DISPOSABLE) ×2 IMPLANT
GOWN STRL REUS W/ TWL XL LVL3 (GOWN DISPOSABLE) ×2 IMPLANT
KIT TURNOVER KIT A (KITS) ×2 IMPLANT
MANIFOLD NEPTUNE II (INSTRUMENTS) ×2 IMPLANT
NDL HYPO 25X1 1.5 SAFETY (NEEDLE) ×2 IMPLANT
NEEDLE HYPO 25X1 1.5 SAFETY (NEEDLE) ×1 IMPLANT
NS IRRIG 500ML POUR BTL (IV SOLUTION) ×2 IMPLANT
PACK BASIN MINOR ARMC (MISCELLANEOUS) ×2 IMPLANT
SUT STRATA 3-0 15 PS-2 (SUTURE) IMPLANT
SUT VIC AB 2-0 SH 27XBRD (SUTURE) ×2 IMPLANT
SUT VIC AB 3-0 SH 27X BRD (SUTURE) ×2 IMPLANT
TRAP FLUID SMOKE EVACUATOR (MISCELLANEOUS) ×2 IMPLANT
WATER STERILE IRR 500ML POUR (IV SOLUTION) ×2 IMPLANT

## 2023-07-16 NOTE — Anesthesia Procedure Notes (Signed)
 Procedure Name: General with mask airway Date/Time: 07/16/2023 8:02 AM  Performed by: Ledora Duncan, CRNAPre-anesthesia Checklist: Patient identified, Emergency Drugs available, Suction available and Patient being monitored Patient Re-evaluated:Patient Re-evaluated prior to induction Oxygen Delivery Method: Simple face mask Induction Type: IV induction Placement Confirmation: positive ETCO2 and breath sounds checked- equal and bilateral Dental Injury: Teeth and Oropharynx as per pre-operative assessment

## 2023-07-16 NOTE — Op Note (Signed)
 Indications: Patient was found to have a superficial peroneal neuropathy on clinical exam   Given their persistent symptoms in the face of conservative management, they were taken to the OR for superficial peroneal nerve decompression.  Findings: Significant compression noted at the fascial exit of the superficial peroneal nerve proximal to the lateral malleolus.  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     Preoperative Note: The patient was seen again in the preoperative area.  They continue to have symptoms of superficial peroneal neuropathy that were causing a significant impact on their lives.  They did not have any improvement with conservative management therefore we planned for a peroneal nerve decompression.  Risk of surgery is discussed and include: Infection, bleeding, wound healing issues, nerve injury, pain, failure to relieve the symptoms, need for further surgery.   Procedure:  1) left sided superficial peroneal nerve decompression at the lateral aspect of the lower extremity   Procedure: After obtaining informed consent, the patient taken to the operating room, placed in supine position, monitored anesthesia care was induced.  They were given preoperative antibiotics.  Prepped and draped in the usual fashion.  Comprehensive timeout was performed verifying the patient's name, MRN, planned procedure.   Patient was induced and monitored anesthesia care.  They were supine on the bed.  their hip was padded. Down leg was point padded.  They were then prepped and draped in the usual fashion.  The the nerve was identified on preoperative ultrasound and  mapped.  They had a significant ultrasound mediated Tinel's sign as we passed over the fascial exit.   The skin was infiltrated with local anesthetic.  The skin was opened sharply.  This brought us  down to the investing fascia.  Investing fascia was opened sharply with care taken to spare any crossing nerves or vessels.  We were then able to identify the nerve as it exited the fascia and came up to the skin.  We took careful cuts to circumferentially decompress the superficial peroneal nerve at its fascial exit.  We followed this distally, we then followed it proximally and released the compressive ligament over top of it.  At the end of the procedure the nerve was well decompressed.  It did appear to be quite swollen and edematous.  The wound was copiously irrigated.  Hemostasis was meticulous.  Depo-Medrol  was placed on the nerve with some infiltration of local anesthetic.  The wound was then closed in multiple layers with skin glue on the most superficial aspect.  No immediate complications.   Sponge and pattie counts were correct at the end of the procedure.    I performed this procedure without an assistant surgeon    Penne MICAEL Sharps, MD/MSCR

## 2023-07-16 NOTE — Anesthesia Preprocedure Evaluation (Addendum)
 Anesthesia Evaluation  Patient identified by MRN, date of birth, ID band Patient awake    Reviewed: Allergy & Precautions, H&P , NPO status , Patient's Chart, lab work & pertinent test results  History of Anesthesia Complications (+) PONV and history of anesthetic complications  Airway Mallampati: II  TM Distance: >3 FB Neck ROM: full    Dental no notable dental hx.    Pulmonary neg pulmonary ROS   Pulmonary exam normal        Cardiovascular negative cardio ROS Normal cardiovascular exam     Neuro/Psych   Anxiety     negative neurological ROS  negative psych ROS   GI/Hepatic negative GI ROS, Neg liver ROS,,,  Endo/Other  negative endocrine ROS    Renal/GU negative Renal ROS  negative genitourinary   Musculoskeletal  (+) Arthritis ,    Abdominal   Peds  Hematology negative hematology ROS (+)   Anesthesia Other Findings Past Medical History: No date: Anxiety No date: Cardiac murmur No date: Palpitations No date: PONV (postoperative nausea and vomiting)  Past Surgical History: No date: CESAREAN SECTION     Comment:  3x 2016: COLONOSCOPY 02/12/2022: Left Peroneal nerve decompression at the fibular neck;  Left     Comment:  Dr. Penne Sharps, done at [facility] No date: neck and thigh lift     Comment:  2023 - 2024 No date: PINNING FOR LEFT TOE FRACTURE No date: tummy tuck     Reproductive/Obstetrics negative OB ROS                              Anesthesia Physical Anesthesia Plan  ASA: 2  Anesthesia Plan: General   Post-op Pain Management: Regional block* and Tylenol PO (pre-op)*   Induction: Intravenous  PONV Risk Score and Plan: Propofol infusion and TIVA  Airway Management Planned: Natural Airway  Additional Equipment:   Intra-op Plan:   Post-operative Plan:   Informed Consent: I have reviewed the patients History and Physical, chart, labs and discussed the  procedure including the risks, benefits and alternatives for the proposed anesthesia with the patient or authorized representative who has indicated his/her understanding and acceptance.     Dental Advisory Given  Plan Discussed with: CRNA and Surgeon  Anesthesia Plan Comments:          Anesthesia Quick Evaluation

## 2023-07-16 NOTE — Discharge Instructions (Addendum)
  Your surgeon has performed an operation on the nerves in your lower extremity. Many times, patients feel better immediately after surgery and can "overdo it." Even if you feel well, it is important that you follow these activity guidelines. If you do not let your nerve heal properly from the surgery, you can increase the chance of return of your symptoms. The following are instructions to help in your recovery once you have been discharged from the hospital.  It is normal for your nerves to feel increased "tingling" after surgery or a sensation that the nerve is "waking up".  This generally will resolve in a short period of time, within 1 to 2 weeks.  * It is ok to take NSAIDs after surgery.  Activity    Increase physical activity slowly as tolerated.  Taking short walks is encouraged, but avoid strenuous exercise. Do not jog, run, bicycle, lift weights, or participate in any other exercises unless specifically allowed by your doctor. Avoid prolonged sitting, including car rides.  You should not drive until no longer taking any new pain medication.  Do not return to work until the timing previously discussed in clinic  You may shower three days after your surgery.  After showering, lightly dab your incision dry. Do not take a tub bath or go swimming for 3 weeks, or until approved by your doctor at your follow-up appointment.  If you smoke, we strongly recommend that you quit.  Smoking has been proven to interfere with normal healing in your back and will dramatically reduce the success rate of your surgery. Please contact [facility] (800-QUIT-NOW) and use the resources at www.[facility].com for assistance in stopping smoking.  Surgical Incision   If you have a dressing on your incision, you may remove it three days after your surgery. Keep your incision area clean and dry.  Your sutures are under your skin and your wound is covered with surgical glue.  The glue should begin to peel away within  about a week.   Diet            You may return to your usual diet. Be sure to stay hydrated.  When to Contact us  Although your surgery and recovery will likely be uneventful, you may have some residual numbness, aches, and pains in your back and/or legs. This is normal and should improve in the next few weeks.  However, should you experience any of the following, contact us immediately: New numbness or weakness Pain that is progressively getting worse, and is not relieved by your pain medications or rest Bleeding, redness, swelling, pain, or drainage from surgical incision Chills or flu-like symptoms Fever greater than 101.0 F (38.3 C) Problems with bowel or bladder functions Difficulty breathing or shortness of breath Warmth, tenderness, or swelling in your calf  Contact Information How to contact us:  If you have any questions/concerns before or after surgery, you can reach [place] at [phone], or you can send a mychart message. We can be reached by phone or mychart 8am-4pm, Monday-Friday.  *Please note: Calls after 4pm are forwarded to a third party answering service. Mychart messages are not routinely monitored during evenings, weekends, and holidays. Please call our office to contact the answering service for urgent concerns during non-business hours.

## 2023-07-16 NOTE — Interval H&P Note (Signed)
 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  The various methods of treatment have been discussed with the patient and family. After consideration of risks, benefits and other options for treatment, the patient has consented to  Procedure(s) with comments: PERONEAL NERVE DECOMPRESSION (Left) - LEFT SUPERFICIAL PERONEAL NERVE DECOMPRESSION AT FIBULAR SHAFT as a surgical intervention.  The patient's history has been reviewed, patient examined, no change in status, stable for surgery.  I have reviewed the patient's chart and labs.  Questions were answered to the patient's satisfaction.    Heart and lungs clear   Jodi Anderson

## 2023-07-16 NOTE — Transfer of Care (Addendum)
 Immediate Anesthesia Transfer of Care Note  Patient: Jodi Anderson  Procedure(s) Performed: PERONEAL NERVE DECOMPRESSION (Left: Leg Lower)  Patient Location: PACU  Anesthesia Type:General  Level of Consciousness: awake, drowsy, and patient cooperative  Airway & Oxygen Therapy: Patient Spontanous Breathing and Patient connected to face mask oxygen  Post-op Assessment: Report given to RN and Post -op Vital signs reviewed and stable  Post vital signs: Reviewed and stable  Last Vitals:  Vitals Value Taken Time  BP 108/62 07/16/23 08:30  Temp    Pulse 88 07/16/23 08:30  Resp 16 07/16/23 08:30  SpO2 100 % 07/16/23 08:30  Vitals shown include unfiled device data.  Last Pain:  Vitals:   07/16/23 0731  TempSrc:   PainSc: 0-No pain         Complications: No notable events documented.

## 2023-07-16 NOTE — Anesthesia Postprocedure Evaluation (Signed)
 Anesthesia Post Note  Patient: Jodi Anderson  Procedure(s) Performed: PERONEAL NERVE DECOMPRESSION (Left: Leg Lower)  Patient location during evaluation: PACU Anesthesia Type: General Level of consciousness: awake and alert Pain management: pain level controlled Vital Signs Assessment: post-procedure vital signs reviewed and stable Respiratory status: spontaneous breathing, nonlabored ventilation and respiratory function stable Cardiovascular status: blood pressure returned to baseline and stable Postop Assessment: no apparent nausea or vomiting Anesthetic complications: no   No notable events documented.   Last Vitals:  Vitals:   07/16/23 0915 07/16/23 0936  BP: 125/80   Pulse:  78  Resp:  18  Temp:  36.6 C  SpO2:  100%    Last Pain:  Vitals:   07/16/23 0936  TempSrc:   PainSc: 0-No pain                 Camellia Merilee Louder

## 2023-07-17 ENCOUNTER — Telehealth: Payer: Self-pay | Admitting: Family Medicine

## 2023-07-17 ENCOUNTER — Encounter: Payer: Self-pay | Admitting: Neurosurgery

## 2023-07-17 ENCOUNTER — Other Ambulatory Visit: Payer: Self-pay | Admitting: Family

## 2023-07-17 DIAGNOSIS — F419 Anxiety disorder, unspecified: Secondary | ICD-10-CM

## 2023-07-17 NOTE — Telephone Encounter (Unsigned)
 Copied from CRM [id number]. Topic: Clinical - Medication Refill >> Jul 17, 2023 12:29 PM Henretta I wrote: Medication: ALPRAZolam  (XANAX ) 0.25 MG tablet  Has the patient contacted their pharmacy? No (Agent: If no, request that the patient contact the pharmacy for the refill. If patient does not wish to contact the pharmacy document the reason why and proceed with request.) (Agent: If yes, when and what did the pharmacy advise?)  This is the patient's preferred pharmacy:  [id number] [place], [place] - [name] [address] Phone: [phone] Fax: [phone]  Is this the correct pharmacy for this prescription? Yes If no, delete pharmacy and type the correct one.   Has the prescription been filled recently? No  Is the patient out of the medication? No, only has a few left   Has the patient been seen for an appointment in the last year OR does the patient have an upcoming appointment? Yes  Can we respond through MyChart? Yes  Agent: Please be advised that Rx refills may take up to 3 business days. We ask that you follow-up with your pharmacy.

## 2023-07-17 NOTE — Telephone Encounter (Signed)
 Patient is here and is asking if she has a refill for gabapentin . She is running low and asked to see if one has already been sent in for her, she does not know if one has already been sent in. Please advise.

## 2023-07-18 ENCOUNTER — Other Ambulatory Visit: Payer: Self-pay

## 2023-07-18 ENCOUNTER — Ambulatory Visit: Admitting: Family Medicine

## 2023-07-18 DIAGNOSIS — M5416 Radiculopathy, lumbar region: Secondary | ICD-10-CM

## 2023-07-18 MED ORDER — GABAPENTIN 300 MG PO CAPS: 300.0000 mg | ORAL_CAPSULE | Freq: Every day | ORAL | 3 refills | Status: AC

## 2023-07-18 MED ORDER — ALPRAZOLAM 0.25 MG PO TABS: 0.2500 mg | ORAL_TABLET | Freq: Every day | ORAL | 0 refills | Status: DC | PRN

## 2023-07-18 NOTE — Progress Notes (Signed)
 Pt called post op day three. Pt concern regarding her throat. She stated that has white dead skin over her tonsils with some pain but no fever. Pt was recommended to call her PCP and have it checked out.

## 2023-07-31 ENCOUNTER — Ambulatory Visit (INDEPENDENT_AMBULATORY_CARE_PROVIDER_SITE_OTHER): Admitting: Physician Assistant

## 2023-07-31 ENCOUNTER — Encounter: Payer: Self-pay | Admitting: Physician Assistant

## 2023-07-31 VITALS — BP 98/70 | Temp 98.3°F

## 2023-07-31 DIAGNOSIS — G5732 Lesion of lateral popliteal nerve, left lower limb: Secondary | ICD-10-CM

## 2023-07-31 DIAGNOSIS — Z09 Encounter for follow-up examination after completed treatment for conditions other than malignant neoplasm: Secondary | ICD-10-CM

## 2023-07-31 MED ORDER — METHYLPREDNISOLONE 4 MG PO TBPK
ORAL_TABLET | ORAL | 0 refills | Status: AC
Start: 2023-07-31 — End: ?

## 2023-07-31 NOTE — Progress Notes (Signed)
   REFERRING PHYSICIAN:  Dineen Rollene MATSU, Fnp [address] [place],  [place] [address]  DOS: 07/16/2023, left superficial peroneal nerve decompression at fibular shaft  HISTORY OF PRESENT ILLNESS: TRUDEE CHIRINO is 2 weeks status post left superficial peroneal nerve decompression. Overall, she is doing okay.  Has not had a significant difference in the neuropathic pain in her leg at this time.  She did talk about how she had some issues with throat swelling after surgery secondary to anesthesia however she is having no difficulty swallowing at this point in time.    PHYSICAL EXAMINATION:  NEUROLOGICAL:  General: In no acute distress.   Awake, alert, oriented to person, place, and time.  Pupils equal round and reactive to light.  Facial tone is symmetric.    Strength: Strength examination is to baseline of left lower extremity.  Incision c/d/I  Imaging:  No new imaging prior to this appointment.  Assessment / Plan: KARALYNN COTTONE is doing well after left superficial peroneal nerve decompression. We discussed the timeline of nerve recovery that she is familiar with that she has previously had a common peroneal nerve decompression in the past.  At this time she not noticed a significant difference, but reiterated that this may take several months.  Plan to do a Medrol  Dosepak due to some rebound pain.  Will see in approximately 1 month for 6-week postop visit.    Advised to contact the office if any questions or concerns arise.   Lyle Decamp PA-C Dept of Neurosurgery

## 2023-08-13 ENCOUNTER — Other Ambulatory Visit: Payer: Self-pay | Admitting: Family Medicine

## 2023-08-14 ENCOUNTER — Telehealth: Payer: Self-pay | Admitting: Family Medicine

## 2023-08-14 NOTE — Telephone Encounter (Signed)
 Pt getting ready for a trip to [place] , states Dr. Claudene has given her injections before for overall pain that helped tremendously. I presume she is talking about a cocktail.  Please confirm we can schedule this Nurse visit and what cocktail to be given.

## 2023-08-15 NOTE — Telephone Encounter (Signed)
 Scheduled

## 2023-08-15 NOTE — Telephone Encounter (Signed)
 LVM for patient to call the office back and schedule a nurse only visit for a half cocktail. Advised patient that she could do this when she called back and she could speak to any of the front staff to get that scheduled.

## 2023-08-28 ENCOUNTER — Ambulatory Visit (INDEPENDENT_AMBULATORY_CARE_PROVIDER_SITE_OTHER): Admitting: Neurosurgery

## 2023-08-28 ENCOUNTER — Telehealth: Payer: Self-pay

## 2023-08-28 ENCOUNTER — Encounter: Payer: Self-pay | Admitting: Family

## 2023-08-28 VITALS — Temp 99.0°F | Wt 120.4 lb

## 2023-08-28 DIAGNOSIS — G5732 Lesion of lateral popliteal nerve, left lower limb: Secondary | ICD-10-CM

## 2023-08-28 DIAGNOSIS — Z09 Encounter for follow-up examination after completed treatment for conditions other than malignant neoplasm: Secondary | ICD-10-CM

## 2023-08-28 NOTE — Progress Notes (Signed)
   REFERRING PHYSICIAN:  Dineen Rollene MATSU, Fnp [address] [place],  [place] [address]  DOS: 07/16/2023, left superficial peroneal nerve decompression at fibular shaft  HISTORY OF PRESENT ILLNESS: Jodi Anderson is 6 weeks status post left superficial peroneal nerve decompression.  Wound is healing well.  She has not had a significant improvement in her pain unfortunately.  She continues to work on her recovery.  PHYSICAL EXAMINATION:  NEUROLOGICAL:  General: In no acute distress.   Awake, alert, oriented to person, place, and time.  Pupils equal round and reactive to light.  Facial tone is symmetric.    Strength: Strength examination is to baseline of left lower extremity.  Incision c/d/I  Imaging:  No new imaging prior to this appointment.  Assessment / Plan: Jodi Anderson is doing continuing to recover after left superficial peroneal nerve decompression.  She has not had a significant improvement in her pain unfortunately.  She had a better improvement with the initial common peroneal nerve decompression.  She still early in her recovery.  We discussed that nerves are slow to heal often recovering only an inch per month.  I would like to continue to follow her until her 34-month appointment to see whether or not we see any meaningful improvement.  If she is not having improvement at that time we could discuss interventions such as neuromodulation either a spinal or nerve stimulator.  She does work with her pain/injection doctor, I let her know that some of these folks also put in nerve stimulators.   Advised to contact the office if any questions or concerns arise.   Penne MICAEL Sharps, MD Dept of Neurosurgery

## 2023-08-28 NOTE — Telephone Encounter (Signed)
 Spoke to pt she  states that she will take pics of spots on legs and upload to my chart

## 2023-08-28 NOTE — Telephone Encounter (Signed)
 Pt spoke with Jenate. See telephone encounter.

## 2023-08-28 NOTE — Telephone Encounter (Signed)
 LVM to call back to address concerns. Please inquire about message below and document

## 2023-08-28 NOTE — Telephone Encounter (Signed)
 Copied from CRM [id number]. Topic: General - Other >> Aug 28, 2023  9:31 AM Aleatha C wrote: Reason for CRM: Patient would like to know if she needs to come in for blood work for her leg and she is breaking out with spider like on leg and would like a call back about it to see what she needs to do

## 2023-09-04 ENCOUNTER — Ambulatory Visit

## 2023-09-04 DIAGNOSIS — M79672 Pain in left foot: Secondary | ICD-10-CM | POA: Diagnosis not present

## 2023-09-04 MED ORDER — METHYLPREDNISOLONE ACETATE 40 MG/ML IJ SUSP
40.0000 mg | Freq: Once | INTRAMUSCULAR | Status: AC
  Administered 2023-09-04 (×2): 40 mg via INTRAMUSCULAR

## 2023-09-04 MED ORDER — KETOROLAC TROMETHAMINE 30 MG/ML IJ SOLN
30.0000 mg | Freq: Once | INTRAMUSCULAR | Status: AC
  Administered 2023-09-04 (×2): 30 mg via INTRAMUSCULAR

## 2023-09-04 NOTE — Progress Notes (Signed)
 Patient received Toradol  30 mg in left buttocks and Methylprednisolone  40 mg in right buttocks. Patient tolerated well.

## 2023-09-09 ENCOUNTER — Ambulatory Visit: Admitting: Internal Medicine

## 2023-09-09 ENCOUNTER — Encounter: Payer: Self-pay | Admitting: Internal Medicine

## 2023-09-09 ENCOUNTER — Ambulatory Visit: Payer: Self-pay | Admitting: Internal Medicine

## 2023-09-09 VITALS — BP 114/72 | HR 88 | Temp 98.1°F | Ht 62.0 in | Wt 116.0 lb

## 2023-09-09 DIAGNOSIS — R233 Spontaneous ecchymoses: Secondary | ICD-10-CM | POA: Insufficient documentation

## 2023-09-09 LAB — COMPREHENSIVE METABOLIC PANEL WITH GFR
ALT: 17 U/L (ref 0–35)
AST: 17 U/L (ref 0–37)
Albumin: 4.5 g/dL (ref 3.5–5.2)
Alkaline Phosphatase: 63 U/L (ref 39–117)
BUN: 16 mg/dL (ref 6–23)
CO2: 30 meq/L (ref 19–32)
Calcium: 9.5 mg/dL (ref 8.4–10.5)
Chloride: 103 meq/L (ref 96–112)
Creatinine, Ser: 0.66 mg/dL (ref 0.40–1.20)
GFR: 93.95 mL/min (ref >60.00)
Glucose, Bld: 118 mg/dL — ABNORMAL HIGH (ref 70–99)
Potassium: 4.1 meq/L (ref 3.5–5.1)
Sodium: 142 meq/L (ref 135–145)
Total Bilirubin: 0.5 mg/dL (ref 0.2–1.2)
Total Protein: 6.5 g/dL (ref 6.0–8.3)

## 2023-09-09 LAB — CBC WITH DIFFERENTIAL/PLATELET
Basophils Absolute: 0.1 K/uL (ref 0.0–0.1)
Basophils Relative: 1.1 % (ref 0.0–3.0)
Eosinophils Absolute: 0.1 K/uL (ref 0.0–0.7)
Eosinophils Relative: 1.2 % (ref 0.0–5.0)
HCT: 41 % (ref 36.0–46.0)
Hemoglobin: 13.9 g/dL (ref 12.0–15.0)
Lymphocytes Relative: 24.3 % (ref 12.0–46.0)
Lymphs Abs: 1.4 K/uL (ref 0.7–4.0)
MCHC: 34 g/dL (ref 30.0–36.0)
MCV: 90.5 fl (ref 78.0–100.0)
Monocytes Absolute: 0.6 K/uL (ref 0.1–1.0)
Monocytes Relative: 11.3 % (ref 3.0–12.0)
Neutro Abs: 3.5 K/uL (ref 1.4–7.7)
Neutrophils Relative %: 62.1 % (ref 43.0–77.0)
Platelets: 303 K/uL (ref 150.0–400.0)
RBC: 4.53 Mil/uL (ref 3.87–5.11)
RDW: 13.1 % (ref 11.5–15.5)
WBC: 5.7 K/uL (ref 4.0–10.5)

## 2023-09-09 NOTE — Assessment & Plan Note (Signed)
-   Patient complains of intermittent bruising of her lower extremities for the past few months beginning in May -She denies any history of antecedent trauma -She does have a history of intermittent steroid use which may cause increased risk for thinning of the skin and easy bruising -On exam today, she has no ecchymosis or purpura noted on her lower extremities -Will obtain blood work for further evaluation including CBC, CMP, PT/INR -If this is negative and patient has recurrent symptoms would consider further evaluation -No further workup at this time

## 2023-09-09 NOTE — Patient Instructions (Addendum)
-   It was a pleasure meeting you today -I suspect that the bruising you are experiencing may be associated with steroids you have had to take intermittently for your nerve pain -We will check some blood work on you today to rule out other causes including your kidney function, liver function, blood counts and your clotting time -If the spots are recurred please follow-up with us  for further evaluation -Please contact us  with any questions or concerns or if you need any refills

## 2023-09-09 NOTE — Progress Notes (Signed)
 Acute Office Visit  Subjective:     Patient ID: Jodi Anderson, female    DOB: 1960/03/25, 63 y.o.   MRN: [id number]  Chief Complaint  Patient presents with   Acute Visit    Purpura spots on skin    HPI Patient is in today for easy bruising noted over her lower extremities over the past few months beginning in May.  Patient states that she has noted purple spots over her legs that come and go.  She denies any associated trauma.  She has been on steroids intermittently status post peroneal nerve decompression.  Currently, she has no bruising noted.  No fevers or chills.  No hematuria.  No melena or hematochezia noted  Of note, patient is also on gabapentin  for neuropathic pain and is concerned about the risk for dementia on this medication.  Review of Systems  Constitutional: Negative.   HENT: Negative.    Respiratory: Negative.    Cardiovascular: Negative.   Genitourinary: Negative.   Skin:        Purple spots over her lower extremities that come and go  Neurological: Negative.         Objective:    BP 114/72   Pulse 88   Temp 98.1 F (36.7 C)   Ht 5' 2 (1.575 m)   Wt 116 lb (52.6 kg)   LMP 06/23/2014 (Approximate)   SpO2 96%   BMI 21.22 kg/m    Physical Exam Constitutional:      Appearance: Normal appearance.  HENT:     Head: Normocephalic and atraumatic.  Cardiovascular:     Rate and Rhythm: Normal rate and regular rhythm.     Heart sounds: Normal heart sounds.  Pulmonary:     Effort: Pulmonary effort is normal.     Breath sounds: Normal breath sounds. No wheezing, rhonchi or rales.  Abdominal:     General: Bowel sounds are normal. There is no distension.     Palpations: Abdomen is soft.     Tenderness: There is no abdominal tenderness. There is no guarding or rebound.  Musculoskeletal:        General: No swelling or tenderness.     Right lower leg: No edema.     Left lower leg: No edema.     Comments: No purpura or ecchymosis noted currently   Skin:    Findings: No bruising, erythema, lesion or rash.  Neurological:     Mental Status: She is alert.  Psychiatric:        Mood and Affect: Mood normal.        Behavior: Behavior normal.     No results found for any visits on 09/09/23.      Assessment & Plan:   Problem List Items Addressed This Visit       Other   Easy bruising - Primary   - Patient complains of intermittent bruising of her lower extremities for the past few months beginning in May -She denies any history of antecedent trauma -She does have a history of intermittent steroid use which may cause increased risk for thinning of the skin and easy bruising -On exam today, she has no ecchymosis or purpura noted on her lower extremities -Will obtain blood work for further evaluation including CBC, CMP, PT/INR -If this is negative and patient has recurrent symptoms would consider further evaluation -No further workup at this time      Relevant Orders   Comprehensive metabolic panel with GFR   CBC  with Differential/Platelet   Protime-INR    No orders of the defined types were placed in this encounter.   No follow-ups on file.  Tobin Witucki, MD

## 2023-09-13 NOTE — Telephone Encounter (Signed)
 Copied from CRM #[id number]. Topic: Clinical - Lab/Test Results >> Sep 13, 2023 12:11 PM Franky GRADE wrote: Reason for CRM: Patient is calling to follow up on lab results, spoke to [facility] who stated they were pending one result but she has not heard back.

## 2023-09-16 ENCOUNTER — Other Ambulatory Visit (INDEPENDENT_AMBULATORY_CARE_PROVIDER_SITE_OTHER)

## 2023-09-16 DIAGNOSIS — R233 Spontaneous ecchymoses: Secondary | ICD-10-CM

## 2023-09-16 NOTE — Addendum Note (Signed)
 Addended by: MARYLEN PRO A on: 09/16/2023 11:51 AM   Modules accepted: Orders

## 2023-09-17 LAB — PROTIME-INR
INR: 0.9
Prothrombin Time: 10.2 s (ref 9.0–11.5)

## 2023-09-25 ENCOUNTER — Telehealth: Payer: Self-pay

## 2023-09-25 ENCOUNTER — Telehealth

## 2023-09-25 DIAGNOSIS — U071 COVID-19: Secondary | ICD-10-CM

## 2023-09-25 MED ORDER — NIRMATRELVIR/RITONAVIR (PAXLOVID)TABLET: ORAL_TABLET | ORAL | 0 refills | Status: AC

## 2023-09-25 NOTE — Assessment & Plan Note (Signed)
 Patient is currently on Paxlovid  (nirmatrelvir  300 mg-ritonavir  100 mg) for mild COVID-19 eventhough this was not prescribed to her and was prescribed to her husband for COVID-19 about 2 weeks ago.  She is tolerating therapy without significant adverse effects.  Reviewed GFR from 09/09/23: 93.95 Continue and complete the full 5-day course of Paxlovid , will prescribe 2 days course of Paxlovid  to complete therapy. Prescription sent to the pharmacy, even if symptoms improve before completion. Hold off Zolpidem  and Xanax  when on Paxlovid .  Reassess if new or worsening symptoms develop (e.g., shortness of breath, chest pain, persistent high fever)

## 2023-09-25 NOTE — Telephone Encounter (Signed)
 Pt has seen Dr Abbey via VV on 09/25/2023

## 2023-09-25 NOTE — Telephone Encounter (Signed)
 Copied from CRM #[id number]. Topic: Clinical - Medical Advice >> Sep 25, 2023  8:27 AM Thersia BROCKS wrote: Reason for CRM: Patient called in stated she tested positive on Monday has been taking paxlovid  for 3 days, some of her husbands that was left over, patient stated she doesnt have the last 2 days, wanted to know if FNP Rollene Northern could call in her own prescription. Patient also wanted to know if she could be send in an amoxicillin  as well.

## 2023-09-25 NOTE — Progress Notes (Signed)
 Virtual Visit via Video Note  I connected with Jodi Anderson on 09/25/23 at  4:30 PM EDT by a video enabled telemedicine application and verified that I am speaking with the correct person using two identifiers.  Patient Location: Home Provider Location: Office/Clinic  I discussed the limitations, risks, security, and privacy concerns of performing an evaluation and management service by video and the availability of in person appointments. I also discussed with the patient that there may be a patient responsible charge related to this service. The patient expressed understanding and agreed to proceed.  Subjective: PCP: Dineen Rollene MATSU, FNP  Chief Complaint  Patient presents with   Covid Positive    Patient says she is currently COVID positive. Patient says her husband was recently COVID positive. Patient has been alternating between Tylenol  and Advil to help with generalized aches. Patient says she has also been taking old Amoxicillin . Patient says she is set to leave out of the country next week for a 40 year anniversary.    Generalized Body Aches   Fever   HPI Patient presents via video visit after positive home COVID-19 on 09/23/2023. Patient started to have URI like symptoms including runny nose, headache, body ache. She did at home COVID test on 09/23/23 which was positive. Her husband recently had COVID (about 2 weeks ago) and was prescribed Paxlovid  for it which he did not take completely. As a result she had leftover Paxlovid  prescribed to her husband which she has been taking, she has 2 dose of Paxlovid  left which will complete 3 days of treatment. She will need 2 more days of antiviral treatment if she were to continue 5 days of  Paxlovid ( 300 mg nirmatrelvir  + 100 mg ritonavir ).   She is holding off on taking Alprazolam  which is prescribed to her for prn use. She is also on Zolpidem  which she takes prn. Reports she has not noted s/e including dizziness and felling better  compared to when URI like symptoms started. Denies fever, chest pain, shortness of breath.   ROS: Per HPI  Current Outpatient Medications:    ALPRAZolam  (XANAX ) 0.25 MG tablet, Take 1 tablet (0.25 mg total) by mouth daily as needed for anxiety., Disp: 30 tablet, Rfl: 0   bimatoprost  (LATISSE ) 0.03 % ophthalmic solution, PLACE 1 APPLICATION UNTO BOTH EYES AT BEDTIME.PLACE ONE DROP EVENLY ALONG THE SKIN OF THE UPPER EYELID AT THE BASE OF EYELASHES ONCE DAILY AT BEDTIME.REPEAT PROCEDURE FOR THE SECOND EYE(USE CLEAN APPLICATOR. (Patient taking differently: Apply 1 drop to eye 3 (three) times a week.), Disp: 3 mL, Rfl: 12   diclofenac  (VOLTAREN ) 75 MG EC tablet, TAKE 1 TABLET BY MOUTH TWICE A DAY, Disp: 60 tablet, Rfl: 4   gabapentin  (NEURONTIN ) 300 MG capsule, Take 1 capsule (300 mg total) by mouth at bedtime., Disp: 90 capsule, Rfl: 3   ibuprofen (ADVIL) 200 MG tablet, Take 400 mg by mouth every 6 (six) hours as needed for moderate pain (pain score 4-6)., Disp: , Rfl:    methylPREDNISolone  (MEDROL  DOSEPAK) 4 MG TBPK tablet, Take by mouth daily, taper daily dose per package instructions., Disp: 21 tablet, Rfl: 0   nirmatrelvir /ritonavir  (PAXLOVID ) 20 x 150 MG & 10 x 100MG  TABS, (Take nirmatrelvir  150 mg two tablets twice daily for 2 days and ritonavir  100 mg one tablet twice daily for 2 days) Patient GFR is 93.95 on 09/09/2023, Disp: 12 tablet, Rfl: 0   zolpidem  (AMBIEN ) 10 MG tablet, Take 1 tablet (10 mg total) by  mouth at bedtime as needed. (Patient taking differently: Take 3.3 mg by mouth at bedtime.), Disp: 30 tablet, Rfl: 2  Observations/Objective: Today's Vitals   09/25/23 1447  PainSc: 0-No pain   Physical Exam Vitals reviewed: Physical exam limited due to nature of virtual visit.SABRA  Neurological:     Mental Status: She is alert and oriented to person, place, and time.  Psychiatric:        Mood and Affect: Mood normal.     Assessment and Plan: COVID-19 Assessment & Plan: Patient is  currently on Paxlovid  (nirmatrelvir  300 mg-ritonavir  100 mg) for mild COVID-19 eventhough this was not prescribed to her and was prescribed to her husband for COVID-19 about 2 weeks ago.  She is tolerating therapy without significant adverse effects.  Reviewed GFR from 09/09/23: 93.95 Continue and complete the full 5-day course of Paxlovid , will prescribe 2 days course of Paxlovid  to complete therapy. Prescription sent to the pharmacy, even if symptoms improve before completion. Hold off Zolpidem  and Xanax  when on Paxlovid .  Reassess if new or worsening symptoms develop (e.g., shortness of breath, chest pain, persistent high fever)    Orders: -     nirmatrelvir /ritonavir ; (Take nirmatrelvir  150 mg two tablets twice daily for 2 days and ritonavir  100 mg one tablet twice daily for 2 days) Patient GFR is 93.95 on 09/09/2023  Dispense: 12 tablet; Refill: 0    Follow Up Instructions: Return if symptoms worsen or fail to improve.   I discussed the assessment and treatment plan with the patient. The patient was provided an opportunity to ask questions, and all were answered. The patient agreed with the plan and demonstrated an understanding of the instructions.   The patient was advised to call back or seek an in-person evaluation if the symptoms worsen or if the condition fails to improve as anticipated.  The above assessment and management plan was discussed with the patient. The patient verbalized understanding of and has agreed to the management plan.   Luke Shade, MD

## 2023-10-07 ENCOUNTER — Encounter: Admitting: Physician Assistant

## 2023-11-07 ENCOUNTER — Other Ambulatory Visit: Payer: Self-pay

## 2023-11-07 ENCOUNTER — Ambulatory Visit: Admitting: Family Medicine

## 2023-11-07 VITALS — BP 100/60 | HR 68 | Ht 62.0 in | Wt 121.0 lb

## 2023-11-07 DIAGNOSIS — M79672 Pain in left foot: Secondary | ICD-10-CM

## 2023-11-07 NOTE — Patient Instructions (Addendum)
 Thank you for coming in today.   You received an injection today. Seek immediate medical attention if the joint becomes red, extremely painful, or is oozing fluid.   Check back as needed

## 2023-11-07 NOTE — Progress Notes (Signed)
 I, Jodi Anderson am a scribe for Dr. Artist Lloyd, MD.  Jodi Anderson is a 63 y.o. female who presents to [facility] today for exacerbation of her L foot pain. Pt was previously seen by Dr. Claudene on 06/12/23 and her superficial peroneal nerve was injected.   Today, pt reports the left foot has been feeling bad. The anti-inflammatory medication has not been helping. Feels like she has been dragging her foot all the time. It feels like there are broken bones but she knows that it isn't from all the x-rays that have been done.   Pertinent review of systems: No fevers or chills  Relevant historical information: Midfoot arthritis   Exam:  BP 100/60   Pulse 68   Ht 5' 2 (1.575 m)   Wt 121 lb (54.9 kg)   LMP 06/23/2014 (Approximate)   SpO2 98%   BMI 22.13 kg/m  General: Well Developed, well nourished, and in no acute distress.   MSK: Right foot and ankle: Some skin hypopigmentation present dorsal midfoot.  Tender to palpation mild in this region.  Tender palpation in the anterior lateral ankle region.    Lab and Radiology Results  Procedure: Real-time Ultrasound Guided Injection of left ankle subtalar joint Device: Philips Affiniti 50G/GE Logiq Images permanently stored and available for review in PACS Verbal informed consent obtained.  Discussed risks and benefits of procedure. Warned about infection, bleeding, hyperglycemia damage to structures among others. Patient expresses understanding and agreement Time-out conducted.   Noted no overlying erythema, induration, or other signs of local infection.   Skin prepped in a sterile fashion.   Local anesthesia: Topical Ethyl chloride.   With sterile technique and under real time ultrasound guidance: 20 mg of Kenalog  and 0.5 ml of Marcaine  injected into left ankle subtalar joint area of pain. Fluid seen entering the joint capsule.   Completed without difficulty   Pain immediately resolved  suggesting accurate placement of the medication.   Advised to call if fevers/chills, erythema, induration, drainage, or persistent bleeding.   Images permanently stored and available for review in the ultrasound unit.  Impression: Technically successful ultrasound guided injection.    Procedure: Real-time Ultrasound Guided Injection of left dorsal midfoot joint Device: Philips Affiniti 50G/GE Logiq Images permanently stored and available for review in PACS Verbal informed consent obtained.  Discussed risks and benefits of procedure. Warned about infection, bleeding, hyperglycemia damage to structures among others. Patient expresses understanding and agreement Time-out conducted.   Noted no overlying erythema, induration, or other signs of local infection.   Skin prepped in a sterile fashion.   Local anesthesia: Topical Ethyl chloride.   With sterile technique and under real time ultrasound guidance:20 Milligrams of Kenalog  and 0.5 mL of Marcaine  injected into dorsal midfoot joint. Fluid seen entering the joint capsule.   Completed without difficulty   Pain immediately resolved suggesting accurate placement of the medication.   Advised to call if fevers/chills, erythema, induration, drainage, or persistent bleeding.   Images permanently stored and available for review in the ultrasound unit.  Impression: Technically successful ultrasound guided injection.   EXAM: X-ray foot left 3 plus views  INDICATION:  Left foot pain [F20.327 (ICD-10-CM)]   TECHNIQUE AND FINDINGS: AP oblique and lateral of the left foot demonstrates retained metallic bone implant in the proximal aspect of the distal phalanx of the great toe.  Metatarsophalangeal joints are well-maintained.  Marked osteoarthritic changes around the midtarsal region most notably at the metatarsal  cuneiform joints.  Lesser extent osteoarthritic changes throughout the remaining midtarsal joints.  Talonavicular subtalar  and calcaneocuboid joints are well-maintained.  No acute fracture throughout.  [name], DPM Exam End: 09/10/23 10:15        Assessment and Plan: 63 y.o. female with left foot and ankle pain due to exacerbation of DJD.  Plan for injection into the foot and ankle as above.  Check back as needed.   PDMP not reviewed this encounter. Orders Placed This Encounter  Procedures   US  LIMITED JOINT SPACE STRUCTURES LOW LEFT(NO LINKED CHARGES)    Reason for Exam (SYMPTOM  OR DIAGNOSIS REQUIRED):   foot pain    Preferred imaging location?:   [facility]   No orders of the defined types were placed in this encounter.    Discussed warning signs or symptoms. Please see discharge instructions. Patient expresses understanding.   The above documentation has been reviewed and is accurate and complete Artist Lloyd, M.D.

## 2023-11-13 ENCOUNTER — Ambulatory Visit: Admitting: Family Medicine

## 2023-11-27 ENCOUNTER — Telehealth: Payer: Self-pay | Admitting: Family

## 2023-11-27 ENCOUNTER — Other Ambulatory Visit: Payer: Self-pay | Admitting: Family

## 2023-11-27 DIAGNOSIS — M79672 Pain in left foot: Secondary | ICD-10-CM

## 2023-11-27 DIAGNOSIS — G4700 Insomnia, unspecified: Secondary | ICD-10-CM

## 2023-11-27 NOTE — Telephone Encounter (Unsigned)
 Copied from CRM #[id number]. Topic: Clinical - Medication Refill >> Nov 27, 2023  1:04 PM Mia F wrote: Medication: zolpidem  (AMBIEN ) 10 MG tablet   Has the patient contacted their pharmacy? Yes (Agent: If no, request that the patient contact the pharmacy for the refill. If patient does not wish to contact the pharmacy document the reason why and proceed with request.) (Agent: If yes, when and what did the pharmacy advise?)  This is the patient's preferred pharmacy:  [id number] [place], [place] - [name] [place] [address] Phone: [phone] Fax: [phone]  Is this the correct pharmacy for this prescription? Yes If no, delete pharmacy and type the correct one.   Has the prescription been filled recently? Yes  Is the patient out of the medication? Yes  Has the patient been seen for an appointment in the last year OR does the patient have an upcoming appointment? Yes  Can we respond through MyChart? Yes  Agent: Please be advised that Rx refills may take up to 3 business days. We ask that you follow-up with your pharmacy.

## 2023-11-27 NOTE — Telephone Encounter (Signed)
 Copied from CRM #[id number]. Topic: Referral - Request for Referral >> Nov 27, 2023  1:08 PM Mia F wrote: Did the patient discuss referral with their provider in the last year? Yes (If No - schedule appointment) (If Yes - send message)  Appointment offered? No  Type of order/referral and detailed reason for visit: Podiatry  Preference of office, provider, location: [facility]  If referral order, have you been seen by this specialty before? Yes (If Yes, this issue or another issue? When? Where?  Can we respond through MyChart? Yes

## 2023-11-28 MED ORDER — ZOLPIDEM TARTRATE 10 MG PO TABS: 10.0000 mg | ORAL_TABLET | Freq: Every evening | ORAL | 2 refills | Status: AC | PRN

## 2023-11-28 NOTE — Telephone Encounter (Signed)
 Spoke to pt  she stated that she needs a referral for her left foot because she has had nerve surgery on left leg and injections but her foot is giving her a fit because of the arthritis and needs to have it checked She stated that she has been seeing Arthea Sharps in [place] also

## 2023-11-28 NOTE — Addendum Note (Signed)
 Addended by: DINEEN ROLLENE MATSU on: 11/28/2023 12:00 PM   Modules accepted: Orders

## 2023-12-02 NOTE — Telephone Encounter (Signed)
 Pt read mychart '   Last read by [name] Medicine at 9:47AM on 11/29/2023.

## 2023-12-04 ENCOUNTER — Other Ambulatory Visit: Payer: Self-pay | Admitting: Family

## 2023-12-04 ENCOUNTER — Ambulatory Visit: Admitting: Podiatry

## 2023-12-04 ENCOUNTER — Ambulatory Visit

## 2023-12-04 ENCOUNTER — Ambulatory Visit (INDEPENDENT_AMBULATORY_CARE_PROVIDER_SITE_OTHER)

## 2023-12-04 DIAGNOSIS — S9031XA Contusion of right foot, initial encounter: Secondary | ICD-10-CM | POA: Diagnosis not present

## 2023-12-04 DIAGNOSIS — M7742 Metatarsalgia, left foot: Secondary | ICD-10-CM

## 2023-12-04 DIAGNOSIS — M19072 Primary osteoarthritis, left ankle and foot: Secondary | ICD-10-CM | POA: Diagnosis not present

## 2023-12-04 DIAGNOSIS — G5792 Unspecified mononeuropathy of left lower limb: Secondary | ICD-10-CM | POA: Diagnosis not present

## 2023-12-04 DIAGNOSIS — E538 Deficiency of other specified B group vitamins: Secondary | ICD-10-CM

## 2023-12-04 NOTE — Progress Notes (Signed)
 Subjective:  Patient ID: Jodi Anderson Medicine, female    DOB: 12/17/1960,  MRN: [id number] HPI Chief Complaint  Patient presents with   Foot Pain    NP - Left foot pain (Referred to and wants to see Dr. Verta). [facility]. She feel x 4 weeks ago. Her left is the main issue today.  She presents today most concerned about her left foot.  She has had decompression of the common peroneal nerve as well as decompression of the superficial peroneal nerve.  She states that these have been anywhere from 6 months to a year and a half since surgery.  She states that it did not make a very big difference on her left foot where she points to the sinus tarsi this the area that is most tender she states that if you tap this area it is exquisitely tender and walking on it makes it hurt even worse.  She also has pain in this area as she points to the tarsal metatarsal joints.  63 y.o. female presents with the above complaint.   ROS: Denies fever chills nausea vomiting muscle aches pains calf pain back pain chest pain shortness of breath.  Past Medical History:  Diagnosis Date   Anxiety    Cardiac murmur    Palpitations    PONV (postoperative nausea and vomiting)    Past Surgical History:  Procedure Laterality Date   CESAREAN SECTION     3x   COLONOSCOPY  2016   Left Peroneal nerve decompression at the fibular neck Left 02/12/2022   Dr. Penne Sharps, done at [facility]   neck and thigh lift     2023 - 2024   PERONEAL NERVE DECOMPRESSION Left 07/16/2023   Procedure: PERONEAL NERVE DECOMPRESSION;  Surgeon: Sharps Penne ORN, MD;  Location: [facility] ORS;  Service: Neurosurgery;  Laterality: Left;  LEFT SUPERFICIAL PERONEAL NERVE DECOMPRESSION AT FIBULAR SHAFT   PINNING FOR LEFT TOE FRACTURE     tummy tuck      Current Outpatient Medications:    ALPRAZolam  (XANAX ) 0.25 MG tablet, Take 1 tablet (0.25 mg total) by mouth daily as needed for anxiety., Disp: 30 tablet, Rfl: 0   bimatoprost  (LATISSE ) 0.03 % ophthalmic solution,  PLACE 1 APPLICATION UNTO BOTH EYES AT BEDTIME.PLACE ONE DROP EVENLY ALONG THE SKIN OF THE UPPER EYELID AT THE BASE OF EYELASHES ONCE DAILY AT BEDTIME.REPEAT PROCEDURE FOR THE SECOND EYE(USE CLEAN APPLICATOR. (Patient taking differently: Apply 1 drop to eye 3 (three) times a week.), Disp: 3 mL, Rfl: 12   diclofenac  (VOLTAREN ) 75 MG EC tablet, TAKE 1 TABLET BY MOUTH TWICE A DAY, Disp: 60 tablet, Rfl: 4   gabapentin  (NEURONTIN ) 300 MG capsule, Take 1 capsule (300 mg total) by mouth at bedtime., Disp: 90 capsule, Rfl: 3   ibuprofen (ADVIL) 200 MG tablet, Take 400 mg by mouth every 6 (six) hours as needed for moderate pain (pain score 4-6)., Disp: , Rfl:    methylPREDNISolone  (MEDROL  DOSEPAK) 4 MG TBPK tablet, Take by mouth daily, taper daily dose per package instructions., Disp: 21 tablet, Rfl: 0   nirmatrelvir /ritonavir  (PAXLOVID ) 20 x 150 MG & 10 x 100MG  TABS, (Take nirmatrelvir  150 mg two tablets twice daily for 2 days and ritonavir  100 mg one tablet twice daily for 2 days) Patient GFR is 93.95 on 09/09/2023, Disp: 12 tablet, Rfl: 0   zolpidem  (AMBIEN ) 10 MG tablet, Take 1 tablet (10 mg total) by mouth at bedtime as needed., Disp: 30 tablet, Rfl: 2  Allergies  Allergen  Reactions   Codeine Nausea Only   Review of Systems Objective:  There were no vitals filed for this visit.  General: Well developed, nourished, in no acute distress, alert and oriented x3   Dermatological: Skin is warm, dry and supple bilateral. Nails x 10 are well maintained; remaining integument appears unremarkable at this time. There are no open sores, no preulcerative lesions, no rash or signs of infection present.  Vascular: Dorsalis Pedis artery and Posterior Tibial artery pedal pulses are 2/4 bilateral with immedate capillary fill time. Pedal hair growth present. No varicosities and no lower extremity edema present bilateral.   Neruologic: Grossly intact via light touch bilateral. Vibratory intact via tuning fork bilateral.  Protective threshold with Semmes Wienstein monofilament intact to all pedal sites bilateral. Patellar and Achilles deep tendon reflexes 2+ bilateral. No Babinski or clonus noted bilateral.  I am able to easily locate the intermediate dorsal cutaneous nerve and when percussed over the sinus tarsi area she experiences pain similar to that when she is walking.  She has no direct pain on palpation of the subtalar joint through the sinus tarsi and no pain on end range of motion of the subtalar joint.  Musculoskeletal: No gross boney pedal deformities bilateral. No pain, crepitus, or limitation noted with foot and ankle range of motion bilateral. Muscular strength 5/5 in all groups tested bilateral.  She has pain with frontal plane range of motion at the tarsometatarsal joints of the left foot and no pain on palpation of the sinus tarsi or on end range of motion of the subtalar joint.  Right foot demonstrates mild reproducible pain overlying the anterior talofibular ligament which is easily palpable.  Does not appear to have instability with ambulation.  Gait: Unassisted, Nonantalgic.    Radiographs: Radiographs taken today demonstrate an osseously mature foot bilaterally.  Right foot demonstrates an osseously mature foot with good bone mineralization there is a small avulsion from the dorsal distal aspect of the talus appears to be more medially located and laterally located.  No ankle injury identified.  Left foot does demonstrate severe osteoarthritic changes of the tarsometatarsal joints 1 2 and 3.  No major rear foot arthritis visible.  No fractures identified.    Assessment & Plan:   Assessment: Grade 1 ankle sprain right foot.  Left foot demonstrates severe osteoarthritic change to the midfoot.  I think she also has a neurological issue with the intermediate dorsal cutaneous nerve she no longer has radiating symptoms to the lateral aspect of the hallux through the deep peroneal nerve but is  having significant pain over the dorsal aspect of the left foot at the tarsometatarsal joints and overlying the sinus tarsi.  This appears to be more neurological.    Plan: We discussed the etiology pathology conservative versus surgical therapies.  Left foot requires a CT for evaluation of the tarsometatarsal joints for future surgical correction.  Also like to visualize the floor of the sinus tarsi and the subtalar joint.  I also provided her with a single injection of local anesthetic to the visible portion of the intermediate dorsal cutaneous nerve proximal to the sinus tarsi.  If this renders her asymptomatic then most likely this is neurological.  She will let me know after the CT scan.  We still need the CT scan for midfoot fusion options.     Jodi Anderson, [place]

## 2023-12-06 NOTE — Addendum Note (Signed)
 Addended by: ELAYNE KNEE E on: 12/06/2023 12:30 PM   Modules accepted: Orders

## 2023-12-09 ENCOUNTER — Ambulatory Visit
Admission: RE | Admit: 2023-12-09 | Discharge: 2023-12-09 | Disposition: A | Discharge status: Home / Self Care | Source: Ambulatory Visit | Attending: Podiatry | Admitting: Podiatry

## 2023-12-09 ENCOUNTER — Other Ambulatory Visit: Payer: Self-pay | Admitting: Podiatry

## 2023-12-09 DIAGNOSIS — M19072 Primary osteoarthritis, left ankle and foot: Secondary | ICD-10-CM

## 2023-12-10 ENCOUNTER — Ambulatory Visit: Admitting: Family Medicine

## 2023-12-10 NOTE — Telephone Encounter (Signed)
 YES, confirmed pt is still taking the b12

## 2023-12-12 ENCOUNTER — Telehealth: Payer: Self-pay | Admitting: Lab

## 2023-12-12 NOTE — Telephone Encounter (Signed)
 Patient is requesting call with CT results.

## 2023-12-17 ENCOUNTER — Telehealth: Payer: Self-pay | Admitting: Family Medicine

## 2023-12-17 ENCOUNTER — Ambulatory Visit: Payer: Self-pay | Admitting: Podiatry

## 2023-12-17 NOTE — Telephone Encounter (Signed)
 Patient called and has been to the foot doctor and they want to do surgery. She would like Dr. Vernon opinion on if she should have surgery or not. They took a CT scan and she would like to speak with one of his assistants regarding this.  Please advise.

## 2023-12-25 ENCOUNTER — Telehealth: Payer: Self-pay | Admitting: Podiatry

## 2023-12-25 NOTE — Telephone Encounter (Signed)
 Patient states that she is unsure if she needs to see you again or the other physician that was mentioned at the last visit.

## 2024-01-06 ENCOUNTER — Ambulatory Visit: Admitting: Podiatry

## 2024-01-07 ENCOUNTER — Other Ambulatory Visit: Payer: Self-pay | Admitting: Family

## 2024-01-07 DIAGNOSIS — F419 Anxiety disorder, unspecified: Secondary | ICD-10-CM

## 2024-01-07 MED ORDER — ALPRAZOLAM 0.25 MG PO TABS: 0.2500 mg | ORAL_TABLET | Freq: Every day | ORAL | 0 refills | Status: DC | PRN

## 2024-01-07 NOTE — Telephone Encounter (Signed)
 Copied from CRM #[id number]. Topic: Clinical - Medication Refill >> Jan 07, 2024 10:59 AM Franky GRADE wrote: Medication: ALPRAZolam  (XANAX ) 0.25 MG tablet [[id number]]  Has the patient contacted their pharmacy? No (Agent: If no, request that the patient contact the pharmacy for the refill. If patient does not wish to contact the pharmacy document the reason why and proceed with request.) (Agent: If yes, when and what did the pharmacy advise?)  This is the patient's preferred pharmacy:  [id number] [place], [place] - [name] [address] Phone: [phone] Fax: [phone]    Is this the correct pharmacy for this prescription? Yes If no, delete pharmacy and type the correct one.   Has the prescription been filled recently? No  Is the patient out of the medication? Yes  Has the patient been seen for an appointment in the last year OR does the patient have an upcoming appointment? Yes  Can we respond through MyChart? Yes  Agent: Please be advised that Rx refills may take up to 3 business days. We ask that you follow-up with your pharmacy.

## 2024-01-13 ENCOUNTER — Ambulatory Visit: Admitting: Podiatry

## 2024-01-13 VITALS — Ht 62.0 in | Wt 121.0 lb

## 2024-01-13 DIAGNOSIS — M19072 Primary osteoarthritis, left ankle and foot: Secondary | ICD-10-CM

## 2024-01-13 DIAGNOSIS — G5792 Unspecified mononeuropathy of left lower limb: Secondary | ICD-10-CM | POA: Diagnosis not present

## 2024-01-14 NOTE — Progress Notes (Signed)
 "   Subjective:  Patient ID: Damien Medicine, female    DOB: May 25, 1960,  MRN: [id number]  Chief Complaint  Patient presents with   Foot Problem    RM 6 Patient is here to f/u on osteoarthritic changes to the left midfoot, review CT results and a possible surgical consultation.    63 y.o. female presents with the above complaint. History confirmed with patient.  She is referred to me by Dr. Patsy she recent completed a CT scan which showed midfoot arthritis.  She has pain in this joint for some time and has been worsening of note she has also had an extensive neurologic and neurosurgical history with previous left sided back issues has had injections for this as well as left superficial peroneal and common peroneal nerve decompression at the fibular level as well as over the superior peroneal retinaculum at the ankle.  This helped with shooting pains in the leg and tingling towards her big toe but still has shooting pain along the nerve to the outside of the foot.  Dr. Verta did an injection for this which has helped but it started to wear off.  Objective:  Physical Exam: warm, good capillary refill, no trophic changes or ulcerative lesions, normal DP and PT pulses, normal sensory exam, and Pain and tenderness over the midtarsal joint, she has neuritic symptoms at the level of the proximal ankle along the superficial peroneal nerve which is palpable    Study Result  Narrative & Impression  CLINICAL DATA:  Chronic foot pain.   EXAM: CT OF THE LEFT ANKLE WITHOUT CONTRAST   TECHNIQUE: Multidetector CT imaging of the left ankle was performed according to the standard protocol. Multiplanar CT image reconstructions were also generated.   RADIATION DOSE REDUCTION: This exam was performed according to the departmental dose-optimization program which includes automated exposure control, adjustment of the mA and/or kV according to patient size and/or use of iterative reconstruction technique.    COMPARISON:  Left foot radiographs dated 12/04/2023.   FINDINGS: Bones/Joint/Cartilage   No acute fracture or dislocation. Severe degenerative arthropathy of the second and third TMT joints manifested by joint space narrowing, cortical irregularity, osteophytosis, and subchondral cystic changes. Less pronounced degenerative changes of the fourth, fifth and first TMT joints. Mild degenerative changes at the naviculocuneiform articulation. Ankle mortise is congruent. Calcaneal enthesopathy at the origin of the central cord of the plantar fascia.   Ligaments   Ligaments are suboptimally evaluated by CT.   Muscles and Tendons Muscles are within normal limits.   Soft tissue No fluid collection or hematoma.   IMPRESSION: 1. Severe degenerative arthropathy of the second and third TMT joints manifested by joint space narrowing, cortical irregularity, osteophytosis, and subchondral cystic changes. Less pronounced degenerative changes of the fourth, fifth and first TMT joints. Mild degenerative changes at the naviculocuneiform articulation. 2. Calcaneal enthesopathy at the origin of the central cord of the plantar fascia.     Electronically Signed   By: Harrietta Sherry M.D.   On: 12/11/2023 14:34    Assessment:  No diagnosis found.   Plan:  Patient was evaluated and treated and all questions answered.  Reviewed her CT results which showed end-stage arthrosis of the 2nd and 3rd TMT's, I discussed with her that surgical fusion of this could alleviate her symptoms, with her complex neurological history also discussed with her the option of deep peroneal neurectomy as well as possible superficial peroneal neurectomy would like her to see my partner Dr. Prentice Ovens  for this for consultation and possible diagnostic block, discussed the rationale for the procedures and recovery process of this versus a TMT fusion.  She will follow-up with him as scheduled.  No follow-ups on file.    "

## 2024-01-29 ENCOUNTER — Ambulatory Visit

## 2024-01-29 DIAGNOSIS — M19072 Primary osteoarthritis, left ankle and foot: Secondary | ICD-10-CM | POA: Diagnosis not present

## 2024-01-29 DIAGNOSIS — M7742 Metatarsalgia, left foot: Secondary | ICD-10-CM

## 2024-01-29 DIAGNOSIS — G5792 Unspecified mononeuropathy of left lower limb: Secondary | ICD-10-CM

## 2024-01-29 NOTE — Progress Notes (Signed)
 "  Subjective:  Patient ID: Jodi Anderson, female    DOB: August 22, 1960,  MRN: [id number]  Chief Complaint  Patient presents with   Foot Pain    Neuritis of left foot Ref. By Dr. Medicine. Wants nerve cut      Discussed the use of AI scribe software for clinical note transcription with the patient, who gave verbal consent to proceed.  History of Present Illness Jodi Anderson is a 64 year old female with chronic left foot pain due to nerve entrapment and midfoot arthritis who presents for evaluation of persistent symptoms.  She has had left foot pain for eight to nine years, described as constant aching and throbbing with pulling sensations over the dorsal and lateral foot. Pain is most severe in two focal areas, occasionally with burning and tingling. Flares sometimes force her to stop walking and limit her overall walking tolerance. She denies cold sensitivity.   She has had multiple nerve injections to the sciatic and local foot nerves with only short-term relief. She underwent common peroneal nerve decompression two years ago and superficial peroneal nerve neurolysis seven months ago without meaningful improvement, though the prior firing to the toe resolved after the most recent surgery. Imaging has shown midfoot arthritis.  She previously used gabapentin , usually 300 mg nightly with prior higher doses, and stopped it two weeks ago due to lack of benefit and concern for side effects, with no change in pain since stopping.     Review of Systems: Negative except as noted in the HPI. Denies N/V/F/Ch.  Past Medical History:  Diagnosis Date   Anxiety    Cardiac murmur    Palpitations    PONV (postoperative nausea and vomiting)    Current Medications[1]  Tobacco Use History[2]  Allergies[3] Objective:   Constitutional Well developed. Well nourished. Oriented to person, place, and time.  Vascular Dorsalis pedis pulses palpable bilaterally. Posterior tibial pulses palpable  bilaterally. Capillary refill normal to all digits.  No cyanosis or clubbing noted. Pedal hair growth normal.  Neurologic Normal speech. Epicritic sensation to light touch grossly present bilaterally. Negative tinel sign at tarsal tunnel bilaterally.  Tinel over superficial peroneal nerve gives minor radiating pain.  She states that this is improved compared to how it was prior to the neurolysis. No pathology over common peroneal nerve.  Dermatologic Skin texture and turgor are within normal limits.  No open wounds. No skin lesions.  Musculoskeletal: 5/5 strength to pedal invertors, evertors, plantarflexors, and dorsiflexors. Hindfoot rectus. Hindfoot ROM without pain and WNL.  Normal ankle joint ROM without Equinus. Ankle joint ROM pain free.  Midfoot ROM decreased and painful. Patient has pain to palpation of dorsal midfoot most prominent at 2/3 TMT. Metatarsus adductus absent.    Radiographs and CT imaging taken previously demonstrate end stage arthritis at 2/3 tarsometatarsal joints. Mild arthritis at TMT 1, 4, 5. Rectus foot shape. No acute osseous abnormalities.    Assessment:   1. Primary osteoarthritis of left foot   2. Neuritis of left ankle   3. Metatarsalgia of left foot      Plan:  Patient was evaluated and treated and all questions answered.  Assessment and Plan Assessment & Plan Left foot pain due to nerve entrapment and/or midfoot arthritis Imaging confirmed midfoot arthritis. Previous nerve blocks indicated neuropathic pain. Gabapentin  ineffective. - Performed diagnostic deep peroneal nerve block to simulate neurectomy effect. Injection performed with 1.5cc 0.5%marcaine  plain:1.5cc 2%lidocaine  plain. Injected just lateral to tibialis anterior tendon approximately 3 cm proximal to  tibiotalar joint.  - Instructed her to monitor pain relief over 6-8 hours and report outcomes at follow-up. - Scheduled follow-up in one week to assess response and guide further  management. - Discussed potential for deep peroneal neurectomy if block provides significant relief, noting risks of permanent sensory loss between first and second toes. - If pain relief is incomplete, further evaluation of other nerve involvement and possible additional diagnostic blocks may be warranted. Consider SPN block.  - Reviewed prior gabapentin  use and agreed to continue without it given lack of efficacy.   RTC 1 week  Prentice Ovens, DPM AACFAS Fellowship Trained Podiatric Surgeon [facility]     [1]  Current Outpatient Medications:    ALPRAZolam  (XANAX ) 0.25 MG tablet, Take 1 tablet (0.25 mg total) by mouth daily as needed for anxiety., Disp: 30 tablet, Rfl: 0   bimatoprost  (LATISSE ) 0.03 % ophthalmic solution, PLACE 1 APPLICATION UNTO BOTH EYES AT BEDTIME.PLACE ONE DROP EVENLY ALONG THE SKIN OF THE UPPER EYELID AT THE BASE OF EYELASHES ONCE DAILY AT BEDTIME.REPEAT PROCEDURE FOR THE SECOND EYE(USE CLEAN APPLICATOR. (Patient taking differently: Apply 1 drop to eye 3 (three) times a week.), Disp: 3 mL, Rfl: 12   cyanocobalamin  (VITAMIN B12) 1000 MCG/ML injection, INJECT 1 ML INTRAMUSCULARLY IN THE THIGH(VASTUS LATERALIS) ONCE PER WEEK FOR 4 WEEKS, FOLLOWED BY 1 ML ONCE PER MONTH FOR 6 MONTHS., Disp: 1 mL, Rfl: 11   diclofenac  (VOLTAREN ) 75 MG EC tablet, TAKE 1 TABLET BY MOUTH TWICE A DAY, Disp: 60 tablet, Rfl: 4   gabapentin  (NEURONTIN ) 300 MG capsule, Take 1 capsule (300 mg total) by mouth at bedtime., Disp: 90 capsule, Rfl: 3   ibuprofen (ADVIL) 200 MG tablet, Take 400 mg by mouth every 6 (six) hours as needed for moderate pain (pain score 4-6)., Disp: , Rfl:    methylPREDNISolone  (MEDROL  DOSEPAK) 4 MG TBPK tablet, Take by mouth daily, taper daily dose per package instructions., Disp: 21 tablet, Rfl: 0   nirmatrelvir /ritonavir  (PAXLOVID ) 20 x 150 MG & 10 x 100MG  TABS, (Take nirmatrelvir  150 mg two tablets twice daily for 2 days and ritonavir  100 mg one tablet twice  daily for 2 days) Patient GFR is 93.95 on 09/09/2023, Disp: 12 tablet, Rfl: 0   zolpidem  (AMBIEN ) 10 MG tablet, Take 1 tablet (10 mg total) by mouth at bedtime as needed., Disp: 30 tablet, Rfl: 2 [2]  Social History Tobacco Use  Smoking Status Never  Smokeless Tobacco Never  [3]  Allergies Allergen Reactions   Codeine Nausea Only   "

## 2024-02-01 ENCOUNTER — Other Ambulatory Visit: Payer: Self-pay | Admitting: Family Medicine

## 2024-02-05 ENCOUNTER — Ambulatory Visit

## 2024-02-05 DIAGNOSIS — M19072 Primary osteoarthritis, left ankle and foot: Secondary | ICD-10-CM | POA: Diagnosis not present

## 2024-02-05 DIAGNOSIS — G5792 Unspecified mononeuropathy of left lower limb: Secondary | ICD-10-CM | POA: Diagnosis not present

## 2024-02-05 NOTE — Progress Notes (Signed)
 "  Subjective:  Patient ID: Jodi Anderson, female    DOB: 09/22/1960,  MRN: [id number]  Chief Complaint  Patient presents with   Injections    Follow up on injection. She feel that things are the same.     Discussed the use of AI scribe software for clinical note transcription with the patient, who gave verbal consent to proceed.  History of Present Illness Jodi Anderson is a 64 year old female with primary osteoarthritis, and neuropathy of the left foot who presents for evaluation of persistent left foot pain following prior nerve decompression and multiple injections.  She has had several years of chronic severe left foot pain localized to two main areas, one just distal to the prior nerve decompression site of the SPN and another region at the dorsal midfoot. Pain can be overwhelming, is worsened by activity and standing, and causes difficulty walking with a sensation of dragging the foot. She no longer has the prior sharp, shooting pain and denies radiation toward the toes after her SPN neurolysis.   She underwent left foot nerve decompression, which resolved the shooting pain but did not fully relieve her overall pain. She has had multiple injections, including nerve blocks and steroids. Two recent nerve blocks gave complete relief for 6-8 hours. One at the SPN over sinus tarsi and one to the DPN 3cm proximal to the ankle, while steroid, ankle, and subtalar injections only provide temporary relief.   CT imaging showed significant arthritis in the affected region of the left foot. She notes episodes of weakness, especially when standing on her toes, and numbness between two toes after nerve numbing procedures. She denies other associated symptoms.     Review of Systems: Negative except as noted in the HPI. Denies N/V/F/Ch.  Past Medical History:  Diagnosis Date   Anxiety    Cardiac murmur    Palpitations    PONV (postoperative nausea and vomiting)    Current  Medications[1]  Tobacco Use History[2]  Allergies[3] Objective:   Constitutional Well developed. Well nourished. Oriented to person, place, and time.  Vascular Dorsalis pedis pulses palpable bilaterally. Posterior tibial pulses palpable bilaterally. Capillary refill normal to all digits.  No cyanosis or clubbing noted. Pedal hair growth normal.  Neurologic Normal speech. Epicritic sensation to light touch grossly present bilaterally. Negative tinel sign at tarsal tunnel bilaterally.  Tinel over superficial peroneal nerve at sinus tarsi gives minimal radiating pain.  She states that this is improved compared to how it was prior to the neurolysis. No pathology over common peroneal nerve.  Dermatologic Skin texture and turgor are within normal limits.  No open wounds. No skin lesions.  Musculoskeletal: 5/5 strength to pedal invertors, evertors, plantarflexors, and dorsiflexors. Hindfoot rectus. Hindfoot ROM without pain and WNL.  Normal ankle joint ROM without Equinus. Ankle joint ROM pain free.  Midfoot ROM decreased and painful. Patient has pain to palpation of dorsal midfoot most prominent at 2/3 TMT. Metatarsus adductus absent.  Pain to palpation of sinus tarsi, no pain with eversion/inversion.     Radiographs and CT imaging taken previously demonstrate end stage arthritis at 2/3 tarsometatarsal joints. Mild arthritis at TMT 1, 4, 5. Rectus foot shape. No acute osseous abnormalities.      Assessment:   1. Primary osteoarthritis of left foot   2. Neuritis of left ankle      Plan:  Patient was evaluated and treated and all questions answered.  Assessment and Plan Assessment & Plan Primary osteoarthritis of left foot  Significant left foot osteoarthritis confirmed by CT, major pain contributor. Nerve blocks provided substantial relief, supporting diagnosis and procedural planning. Nerve excision considered predictable given her response to diagnostic injection with expected  sensation loss and pain reduction. - Reviewed CT imaging for arthritis location and severity. - Discussed deep peroneal nerve excision as a pain treatment option. - Explained outcomes: sensation loss between toes, pain relief in joints. - Discussed nerve excision predictability and response to nerve block. - Will plan to perform DPN neurectomy as part of surgical plan  Neuritis of left ankle vs Subtalar symptoms Left ankle pain partially responsive to nerve blocks and steroid injections into subtalar joint. Multifactorial etiology involving nerve and soft tissue. Repeat nerve release less predictable due to scar tissue risk. Nerve excision would result in loss of sensation to entire dorsal foot. Further imaging needed to clarify pathology. - Reviewed prior nerve decompression and injections. - Discussed repeat nerve release risks: scar tissue, unpredictable outcomes. - Recommended MRI of left ankle to evaluate soft tissue, STJ, ligamentous, and nerve involvement before further surgical intervention. Unclear is lateral ankle/hindfoot pain is due to SPN irritation or underlying STJ edema.  - MRI to assist in surgical planning. May consider subtalar joint debridement vs SPN neurolysis vs doing nothing   RTC after MRI completed  Prentice Ovens, DPM AACFAS Fellowship Trained Podiatric Surgeon [facility]     [1]  Current Outpatient Medications:    ALPRAZolam  (XANAX ) 0.25 MG tablet, Take 1 tablet (0.25 mg total) by mouth daily as needed for anxiety., Disp: 30 tablet, Rfl: 0   bimatoprost  (LATISSE ) 0.03 % ophthalmic solution, PLACE 1 APPLICATION UNTO BOTH EYES AT BEDTIME.PLACE ONE DROP EVENLY ALONG THE SKIN OF THE UPPER EYELID AT THE BASE OF EYELASHES ONCE DAILY AT BEDTIME.REPEAT PROCEDURE FOR THE SECOND EYE(USE CLEAN APPLICATOR. (Patient taking differently: Apply 1 drop to eye 3 (three) times a week.), Disp: 3 mL, Rfl: 12   cyanocobalamin  (VITAMIN B12) 1000 MCG/ML injection, INJECT  1 ML INTRAMUSCULARLY IN THE THIGH(VASTUS LATERALIS) ONCE PER WEEK FOR 4 WEEKS, FOLLOWED BY 1 ML ONCE PER MONTH FOR 6 MONTHS., Disp: 1 mL, Rfl: 11   diclofenac  (VOLTAREN ) 75 MG EC tablet, TAKE 1 TABLET BY MOUTH TWICE A DAY, Disp: 60 tablet, Rfl: 4   gabapentin  (NEURONTIN ) 300 MG capsule, Take 1 capsule (300 mg total) by mouth at bedtime., Disp: 90 capsule, Rfl: 3   ibuprofen (ADVIL) 200 MG tablet, Take 400 mg by mouth every 6 (six) hours as needed for moderate pain (pain score 4-6)., Disp: , Rfl:    methylPREDNISolone  (MEDROL  DOSEPAK) 4 MG TBPK tablet, Take by mouth daily, taper daily dose per package instructions., Disp: 21 tablet, Rfl: 0   nirmatrelvir /ritonavir  (PAXLOVID ) 20 x 150 MG & 10 x 100MG  TABS, (Take nirmatrelvir  150 mg two tablets twice daily for 2 days and ritonavir  100 mg one tablet twice daily for 2 days) Patient GFR is 93.95 on 09/09/2023, Disp: 12 tablet, Rfl: 0   zolpidem  (AMBIEN ) 10 MG tablet, Take 1 tablet (10 mg total) by mouth at bedtime as needed., Disp: 30 tablet, Rfl: 2 [2]  Social History Tobacco Use  Smoking Status Never  Smokeless Tobacco Never  [3]  Allergies Allergen Reactions   Codeine Nausea Only   "

## 2024-02-06 ENCOUNTER — Telehealth: Payer: Self-pay

## 2024-02-06 NOTE — Telephone Encounter (Signed)
Mor[place]

## 2024-02-06 NOTE — Telephone Encounter (Signed)
 Patient called she would like to get her MRI done at [facility]. Please send order to them.

## 2024-02-11 ENCOUNTER — Other Ambulatory Visit

## 2024-02-11 ENCOUNTER — Other Ambulatory Visit: Payer: Self-pay | Admitting: Family

## 2024-02-11 DIAGNOSIS — F419 Anxiety disorder, unspecified: Secondary | ICD-10-CM

## 2024-02-24 ENCOUNTER — Other Ambulatory Visit

## 2024-03-09 ENCOUNTER — Other Ambulatory Visit
